# Patient Record
Sex: Male | Born: 1960 | Race: White | Hispanic: No | Marital: Married | Smoking: Never smoker
Health system: Southern US, Community
[De-identification: ages and names within clinical notes are randomized; demographics above are authoritative.]

## PROBLEM LIST (undated history)

## (undated) DIAGNOSIS — F32A Depression, unspecified: Secondary | ICD-10-CM

## (undated) DIAGNOSIS — K219 Gastro-esophageal reflux disease without esophagitis: Secondary | ICD-10-CM

## (undated) DIAGNOSIS — N529 Male erectile dysfunction, unspecified: Secondary | ICD-10-CM

## (undated) DIAGNOSIS — E785 Hyperlipidemia, unspecified: Secondary | ICD-10-CM

## (undated) DIAGNOSIS — F329 Major depressive disorder, single episode, unspecified: Secondary | ICD-10-CM

## (undated) DIAGNOSIS — T8859XA Other complications of anesthesia, initial encounter: Secondary | ICD-10-CM

## (undated) DIAGNOSIS — T4145XA Adverse effect of unspecified anesthetic, initial encounter: Secondary | ICD-10-CM

## (undated) DIAGNOSIS — T7840XA Allergy, unspecified, initial encounter: Secondary | ICD-10-CM

## (undated) DIAGNOSIS — G2581 Restless legs syndrome: Secondary | ICD-10-CM

## (undated) DIAGNOSIS — I2510 Atherosclerotic heart disease of native coronary artery without angina pectoris: Secondary | ICD-10-CM

## (undated) DIAGNOSIS — R110 Nausea: Secondary | ICD-10-CM

## (undated) HISTORY — PX: OTHER SURGICAL HISTORY: SHX169

## (undated) HISTORY — DX: Allergy, unspecified, initial encounter: T78.40XA

## (undated) HISTORY — DX: Male erectile dysfunction, unspecified: N52.9

## (undated) HISTORY — PX: CARDIAC CATHETERIZATION: SHX172

## (undated) HISTORY — DX: Hyperlipidemia, unspecified: E78.5

## (undated) HISTORY — DX: Gastro-esophageal reflux disease without esophagitis: K21.9

---

## 1997-10-27 ENCOUNTER — Emergency Department (HOSPITAL_COMMUNITY): Admission: EM | Admit: 1997-10-27 | Discharge: 1997-10-28 | Payer: Self-pay | Admitting: Emergency Medicine

## 1997-12-16 ENCOUNTER — Encounter: Payer: Self-pay | Admitting: Gastroenterology

## 1997-12-16 ENCOUNTER — Ambulatory Visit (HOSPITAL_COMMUNITY): Admission: RE | Admit: 1997-12-16 | Discharge: 1997-12-16 | Payer: Self-pay | Admitting: Gastroenterology

## 2005-02-04 ENCOUNTER — Encounter: Admission: RE | Admit: 2005-02-04 | Discharge: 2005-02-04 | Payer: Self-pay | Admitting: Family Medicine

## 2006-11-14 ENCOUNTER — Encounter: Admission: RE | Admit: 2006-11-14 | Discharge: 2006-11-14 | Payer: Self-pay | Admitting: Neurology

## 2013-01-23 ENCOUNTER — Other Ambulatory Visit (HOSPITAL_BASED_OUTPATIENT_CLINIC_OR_DEPARTMENT_OTHER): Payer: Self-pay | Admitting: Family Medicine

## 2013-01-23 DIAGNOSIS — R1011 Right upper quadrant pain: Secondary | ICD-10-CM

## 2013-01-25 ENCOUNTER — Ambulatory Visit (HOSPITAL_BASED_OUTPATIENT_CLINIC_OR_DEPARTMENT_OTHER)
Admission: RE | Admit: 2013-01-25 | Discharge: 2013-01-25 | Disposition: A | Discharge status: Home / Self Care | Payer: 59 | Source: Ambulatory Visit | Attending: Family Medicine | Admitting: Family Medicine

## 2013-01-25 DIAGNOSIS — R1011 Right upper quadrant pain: Secondary | ICD-10-CM | POA: Insufficient documentation

## 2013-01-25 DIAGNOSIS — R110 Nausea: Secondary | ICD-10-CM | POA: Insufficient documentation

## 2013-01-30 ENCOUNTER — Other Ambulatory Visit (HOSPITAL_COMMUNITY): Payer: Self-pay | Admitting: Family Medicine

## 2013-01-30 DIAGNOSIS — R1011 Right upper quadrant pain: Secondary | ICD-10-CM

## 2013-02-06 ENCOUNTER — Encounter (INDEPENDENT_AMBULATORY_CARE_PROVIDER_SITE_OTHER): Payer: Self-pay | Admitting: General Surgery

## 2013-02-11 ENCOUNTER — Encounter (HOSPITAL_COMMUNITY)
Admission: RE | Admit: 2013-02-11 | Discharge: 2013-02-11 | Disposition: A | Discharge status: Home / Self Care | Payer: 59 | Source: Ambulatory Visit | Attending: Family Medicine | Admitting: Family Medicine

## 2013-02-11 DIAGNOSIS — R1011 Right upper quadrant pain: Secondary | ICD-10-CM | POA: Insufficient documentation

## 2013-02-11 MED ORDER — TECHNETIUM TC 99M MEBROFENIN IV KIT
5.0000 | PACK | Freq: Once | INTRAVENOUS | Status: AC | PRN
  Administered 2013-02-11: 5 via INTRAVENOUS

## 2013-02-13 ENCOUNTER — Ambulatory Visit (INDEPENDENT_AMBULATORY_CARE_PROVIDER_SITE_OTHER): Payer: Commercial Managed Care - PPO | Admitting: General Surgery

## 2013-02-13 ENCOUNTER — Encounter (INDEPENDENT_AMBULATORY_CARE_PROVIDER_SITE_OTHER): Payer: Self-pay | Admitting: General Surgery

## 2013-02-13 ENCOUNTER — Encounter (INDEPENDENT_AMBULATORY_CARE_PROVIDER_SITE_OTHER): Payer: Self-pay

## 2013-02-13 VITALS — BP 126/84 | HR 64 | Temp 97.4°F | Resp 16 | Ht 70.5 in | Wt 207.4 lb

## 2013-02-13 DIAGNOSIS — R1011 Right upper quadrant pain: Secondary | ICD-10-CM

## 2013-02-13 MED ORDER — TAMSULOSIN HCL 0.4 MG PO CAPS: 0.4000 mg | ORAL_CAPSULE | Freq: Every day | ORAL | Status: DC

## 2013-02-13 NOTE — Addendum Note (Signed)
Addended by: Redmond Pulling, Delsin Copen M on: 02/13/2013 01:01 PM   Modules accepted: Orders

## 2013-02-13 NOTE — Progress Notes (Addendum)
Patient ID: David Conner, male   DOB: Dec 30, 1960, 53 y.o.   MRN: [id number]  Chief Complaint  Patient presents with  . Abdominal Pain    new pt- eval RUQ pain possible cholelith    HPI David Conner is a 53 y.o. male.   Abdominal Pain Associated symptoms: no chest pain, no chills, no dysuria, no fever, no hematuria and no shortness of breath    53 year old Caucasian male referred by Dr. Marjean Donna for evaluation of right upper quadrant abdominal pain. The patient states he has been having issues for about 2-3 months. He states that the discomfort mainly occurs after eating foods. It is especially more bothersome after eating a heavy meal. He denies any difficulty or pain swallowing solids or liquids. The discomfort worsened after the holidays. It is associated with a little bit of nausea. He also feels bloated across the top part of his abdomen. The discomfort will last for several hours. He denies any NSAID use. He denies any acholic stools. He denies any jaundice. He lost about 45 pounds last year during the spring when he had a lot of stress in his life. He does have a remote history of esophageal reflux as well as dilatation of his esophagus in the past. He states that he probably underwent a dilation. As a result he takes Nexium on a daily basis. He states that he saw his gastroenterologist recently and discussed the symptoms who thought it was suggestive of gallbladder problems. He states that the discomfort will radiate to his right side and sometimes his back. It also bothers him if he hunches forward. He denies any trauma to his abdomen. He has had an abdominal ultrasound which showed no evidence of gallstones. He had a nuclear medicine scan of his gallbladder which was normal. He states when they injected the medicine it reproduced his symptoms  Past Medical History  Diagnosis Date  . GERD (gastroesophageal reflux disease)   . Allergy   . Hemorrhoids   . ED (erectile  dysfunction)   . Hyperlipidemia     History reviewed. No pertinent past surgical history.  History reviewed. No pertinent family history.  Social History History  Substance Use Topics  . Smoking status: Never Smoker   . Smokeless tobacco: Not on file  . Alcohol Use: No    Allergies  Allergen Reactions  . Lansoprazole   . Ofloxacin   . Penicillin G     Current Outpatient Prescriptions  Medication Sig Dispense Refill  . esomeprazole (NEXIUM) 40 MG capsule Take 40 mg by mouth daily at 12 noon.      . sertraline (ZOLOFT) 50 MG tablet 1 tablet      . testosterone (ANDROGEL) 50 MG/5GM GEL Place 5 g onto the skin daily.      . tamsulosin (FLOMAX) 0.4 MG CAPS capsule Take 1 capsule (0.4 mg total) by mouth daily. Start taking 4 days before surgery  7 capsule  0   No current facility-administered medications for this visit.    Review of Systems Review of Systems  Constitutional: Negative for fever, chills, appetite change and unexpected weight change.  HENT: Negative for congestion and trouble swallowing.   Eyes: Negative for visual disturbance.  Respiratory: Negative for chest tightness and shortness of breath.   Cardiovascular: Negative for chest pain and leg swelling.       No PND, no orthopnea, no DOE  Gastrointestinal: Positive for abdominal pain.       See HPI  Genitourinary: Positive for difficulty urinating. Negative for dysuria and hematuria.       +nocturia  Musculoskeletal: Negative.   Skin: Negative for rash.  Neurological: Negative for seizures and speech difficulty.  Hematological: Does not bruise/bleed easily.  Psychiatric/Behavioral: Negative for behavioral problems and confusion.    Blood pressure 126/84, pulse 64, temperature 97.4 F (36.3 C), temperature source Temporal, resp. rate 16, height 5' 10.5" (1.791 m), weight 207 lb 6.4 oz (94.076 kg).  Physical Exam Physical Exam  Constitutional: He is oriented to person, place, and time. He appears  well-developed and well-nourished. No distress.  HENT:  Head: Normocephalic and atraumatic.  Right Ear: External ear normal.  Left Ear: External ear normal.  Eyes: Conjunctivae are normal. No scleral icterus.  Neck: Normal range of motion. Neck supple. No tracheal deviation present. No thyromegaly present.  Cardiovascular: Normal rate, normal heart sounds and intact distal pulses.   Pulmonary/Chest: Effort normal and breath sounds normal. No respiratory distress. He has no wheezes.  Abdominal: Soft. He exhibits no distension. There is no tenderness. There is no rebound and no guarding.  Musculoskeletal: Normal range of motion. He exhibits no edema and no tenderness.  Lymphadenopathy:    He has no cervical adenopathy.  Neurological: He is alert and oriented to person, place, and time. He exhibits normal muscle tone.  Skin: Skin is warm and dry. No rash noted. He is not diaphoretic. No erythema. No pallor.  Psychiatric: He has a normal mood and affect. His behavior is normal. Judgment and thought content normal.    Data Reviewed Dr Meyers notes abd us  HIDA scan  Assessment    RUQ pain  Nausea    Plan    His symptoms are consistent with gallbladder disease however his radiological imaging has been normal. It is a little bit reassuring that he had a reproduction of symptoms with injection of CCK during the nuclear medicine scan.  We discussed the outcomes of gallbladder surgery in this type of situation when testing is negative but symptoms suggest gallbladder disease. I explained that we see mixed results some patients get complete resolution, others some improvement and others no change. His symptoms are not entirely consistent with peptic ulcer disease but it does appear he has a significant history with reflux and esophageal stricture. I explained that he may benefit from an upper endoscopy to rule out the stomach and the duodenum as a source but I explained the upper endoscopy could  be completely negative. I explained that if we had a normal upper endoscopy I could say with a higher degree of confidence that cholecystectomy would have a better chance of ameliorating his symptoms. He states that his father had normal gallbladder tests and had much improvement after cholecystectomy  After much discussion of the pros and cons of proceeding with cholecystectomy without a preoperative upper endoscopy, the patient has elected to proceed with cholecystectomy. He is going to try to have an upper endoscopy prior to his cholecystectomy. I explained that I would attempt to contact his gastroenterologist to discuss his case.  We discussed gallbladder disease. The patient was given educational material. We discussed non-operative and operative management. We discussed the signs & symptoms of acute cholecystitis  I discussed laparoscopic cholecystectomy with IOC in detail.  The patient was given educational material as well as diagrams detailing the procedure.  We discussed the risks and benefits of a laparoscopic cholecystectomy including, but not limited to bleeding, infection, Failure to ameliorate his symptoms, injury to   surrounding structures such as the intestine or liver, bile leak, retained gallstones, need to convert to an open procedure, prolonged diarrhea, blood clots such as  DVT, common bile duct injury, anesthesia risks, and possible need for additional procedures.  We discussed the typical post-operative recovery course. I explained that the likelihood of improvement of their symptoms is fair-good.   The patient will meet with the scheduler today to schedule surgery. I will also place him on perioperative Flomax to decrease his risk of postoperative urinary retention  Leighton Ruff. Redmond Pulling, MD, FACS General, Bariatric, & Minimally Invasive Surgery [facility], [place]        [facility] M 02/13/2013, 12:45 PM

## 2013-02-13 NOTE — Patient Instructions (Signed)
Get flomax prescription filled and start taking 4 days before surgery and on the morning of surgery

## 2013-02-14 ENCOUNTER — Encounter (HOSPITAL_COMMUNITY)
Admission: RE | Admit: 2013-02-14 | Discharge: 2013-02-14 | Disposition: A | Discharge status: Home / Self Care | Payer: 59 | Source: Ambulatory Visit | Attending: General Surgery | Admitting: General Surgery

## 2013-02-14 ENCOUNTER — Encounter (HOSPITAL_COMMUNITY): Payer: Self-pay

## 2013-02-14 ENCOUNTER — Encounter (HOSPITAL_COMMUNITY): Payer: Self-pay | Admitting: Pharmacy Technician

## 2013-02-14 DIAGNOSIS — Z01812 Encounter for preprocedural laboratory examination: Secondary | ICD-10-CM | POA: Insufficient documentation

## 2013-02-14 DIAGNOSIS — Z01818 Encounter for other preprocedural examination: Secondary | ICD-10-CM | POA: Insufficient documentation

## 2013-02-14 HISTORY — DX: Nausea: R11.0

## 2013-02-14 HISTORY — DX: Major depressive disorder, single episode, unspecified: F32.9

## 2013-02-14 HISTORY — DX: Restless legs syndrome: G25.81

## 2013-02-14 HISTORY — DX: Adverse effect of unspecified anesthetic, initial encounter: T41.45XA

## 2013-02-14 HISTORY — DX: Depression, unspecified: F32.A

## 2013-02-14 HISTORY — DX: Other complications of anesthesia, initial encounter: T88.59XA

## 2013-02-14 LAB — COMPREHENSIVE METABOLIC PANEL
ALT: 34 U/L (ref 0–53)
AST: 27 U/L (ref 0–37)
Albumin: 4.1 g/dL (ref 3.5–5.2)
Alkaline Phosphatase: 62 U/L (ref 39–117)
BUN: 13 mg/dL (ref 6–23)
CO2: 25 mEq/L (ref 19–32)
Calcium: 9.3 mg/dL (ref 8.4–10.5)
Chloride: 100 mEq/L (ref 96–112)
Creatinine, Ser: 1.03 mg/dL (ref 0.50–1.35)
GFR calc Af Amer: >90 mL/min (ref >90)
GFR calc non Af Amer: 82 mL/min — ABNORMAL LOW (ref >90)
Glucose, Bld: 95 mg/dL (ref 70–99)
Potassium: 4.4 mEq/L (ref 3.7–5.3)
Sodium: 139 mEq/L (ref 137–147)
Total Bilirubin: 0.4 mg/dL (ref 0.3–1.2)
Total Protein: 7.4 g/dL (ref 6.0–8.3)

## 2013-02-14 LAB — CBC WITH DIFFERENTIAL/PLATELET
Basophils Absolute: 0 10*3/uL (ref 0.0–0.1)
Basophils Relative: 0 % (ref 0–1)
Eosinophils Absolute: 0.1 10*3/uL (ref 0.0–0.7)
Eosinophils Relative: 1 % (ref 0–5)
HCT: 46.9 % (ref 39.0–52.0)
Hemoglobin: 16.3 g/dL (ref 13.0–17.0)
Lymphocytes Relative: 20 % (ref 12–46)
Lymphs Abs: 1.6 10*3/uL (ref 0.7–4.0)
MCH: 31.3 pg (ref 26.0–34.0)
MCHC: 34.8 g/dL (ref 30.0–36.0)
MCV: 90 fL (ref 78.0–100.0)
Monocytes Absolute: 0.7 10*3/uL (ref 0.1–1.0)
Monocytes Relative: 9 % (ref 3–12)
Neutro Abs: 5.5 10*3/uL (ref 1.7–7.7)
Neutrophils Relative %: 70 % (ref 43–77)
Platelets: 250 10*3/uL (ref 150–400)
RBC: 5.21 MIL/uL (ref 4.22–5.81)
RDW: 12.5 % (ref 11.5–15.5)
WBC: 7.8 10*3/uL (ref 4.0–10.5)

## 2013-02-14 NOTE — Patient Instructions (Addendum)
[place]  02/14/2013   Your procedure is scheduled on: Monday February 2nd  Report to [facility] at 925 AM.  Call this number if you have problems the morning of surgery [phone]   Remember: NO VISITORS UNDER AGE 53 PER [place].   Do not eat food or drink liquids :After Midnight.     Take these medicines the morning of surgery with A SIP OF WATER: nexium                                SEE [place]             You may not have any metal on your body including hair pins and piercings  Do not wear jewelry, make-up.  Do not wear lotions, powders, or perfumes. You may not  wear deodorant.   Men may shave face and neck.  Do not bring valuables to the hospital. [place].  Contacts, dentures or bridgework may not be worn into surgery.  Leave suitcase in the car. After surgery it may be brought to your room.  For patients admitted to the hospital, checkout time is 11:00 AM the day of discharge.    Please read over the following fact sheets that you were given: [facility] Preparing for surgery sheet  Call Zelphia Cairo RN pre op nurse if needed [phone]    [place].  PATIENT SIGNATURE___________________________________________  NURSE SIGNATURE_____________________________________________

## 2013-02-15 ENCOUNTER — Telehealth (INDEPENDENT_AMBULATORY_CARE_PROVIDER_SITE_OTHER): Payer: Self-pay | Admitting: General Surgery

## 2013-02-15 NOTE — Telephone Encounter (Signed)
Called patient to let him know I spoke with his gastroenterologist. Gastroenterologist agrees that patient would probably benefit from cholecystectomy based on his symptoms. The patient still has symptoms after surgery and refer back to him.

## 2013-02-18 ENCOUNTER — Ambulatory Visit (HOSPITAL_COMMUNITY): Payer: 59

## 2013-02-18 ENCOUNTER — Encounter (HOSPITAL_COMMUNITY): Payer: 59 | Admitting: Anesthesiology

## 2013-02-18 ENCOUNTER — Encounter (HOSPITAL_COMMUNITY): Payer: Self-pay | Admitting: *Deleted

## 2013-02-18 ENCOUNTER — Observation Stay (HOSPITAL_COMMUNITY)
Admission: RE | Admit: 2013-02-18 | Discharge: 2013-02-19 | Disposition: A | Discharge status: Home / Self Care | Payer: 59 | Source: Ambulatory Visit | Attending: General Surgery | Admitting: General Surgery

## 2013-02-18 ENCOUNTER — Ambulatory Visit (HOSPITAL_COMMUNITY): Payer: 59 | Admitting: Anesthesiology

## 2013-02-18 ENCOUNTER — Encounter (HOSPITAL_COMMUNITY)
Admission: RE | Disposition: A | Discharge status: Home / Self Care | Payer: Self-pay | Source: Ambulatory Visit | Attending: General Surgery

## 2013-02-18 DIAGNOSIS — Z9049 Acquired absence of other specified parts of digestive tract: Secondary | ICD-10-CM

## 2013-02-18 DIAGNOSIS — K219 Gastro-esophageal reflux disease without esophagitis: Secondary | ICD-10-CM | POA: Insufficient documentation

## 2013-02-18 DIAGNOSIS — R1011 Right upper quadrant pain: Secondary | ICD-10-CM | POA: Insufficient documentation

## 2013-02-18 DIAGNOSIS — F329 Major depressive disorder, single episode, unspecified: Secondary | ICD-10-CM | POA: Insufficient documentation

## 2013-02-18 DIAGNOSIS — Q433 Congenital malformations of intestinal fixation: Secondary | ICD-10-CM | POA: Insufficient documentation

## 2013-02-18 DIAGNOSIS — K811 Chronic cholecystitis: Secondary | ICD-10-CM

## 2013-02-18 DIAGNOSIS — E785 Hyperlipidemia, unspecified: Secondary | ICD-10-CM | POA: Insufficient documentation

## 2013-02-18 DIAGNOSIS — F3289 Other specified depressive episodes: Secondary | ICD-10-CM | POA: Insufficient documentation

## 2013-02-18 HISTORY — PX: CHOLECYSTECTOMY: SHX55

## 2013-02-18 HISTORY — PX: LAPAROSCOPIC APPENDECTOMY: SHX408

## 2013-02-18 SURGERY — LAPAROSCOPIC CHOLECYSTECTOMY WITH INTRAOPERATIVE CHOLANGIOGRAM
Anesthesia: General | Site: Abdomen

## 2013-02-18 MED ORDER — KCL IN DEXTROSE-NACL 20-5-0.45 MEQ/L-%-% IV SOLN
INTRAVENOUS | Status: AC
  Filled 2013-02-18: qty 1000

## 2013-02-18 MED ORDER — PHENYLEPHRINE HCL 10 MG/ML IJ SOLN
INTRAMUSCULAR | Status: DC | PRN
  Administered 2013-02-18: 80 ug via INTRAVENOUS
  Administered 2013-02-18 (×4): 40 ug via INTRAVENOUS
  Administered 2013-02-18: 80 ug via INTRAVENOUS
  Administered 2013-02-18 (×2): 40 ug via INTRAVENOUS

## 2013-02-18 MED ORDER — FENTANYL CITRATE 0.05 MG/ML IJ SOLN
INTRAMUSCULAR | Status: DC | PRN
Start: 2013-02-18 — End: 2013-02-18
  Administered 2013-02-18: 100 ug via INTRAVENOUS
  Administered 2013-02-18 (×3): 50 ug via INTRAVENOUS

## 2013-02-18 MED ORDER — GLYCOPYRROLATE 0.2 MG/ML IJ SOLN
INTRAMUSCULAR | Status: AC
  Filled 2013-02-18: qty 3

## 2013-02-18 MED ORDER — KCL IN DEXTROSE-NACL 20-5-0.45 MEQ/L-%-% IV SOLN
INTRAVENOUS | Status: DC
  Administered 2013-02-18 – 2013-02-19 (×2): via INTRAVENOUS
  Filled 2013-02-18 (×4): qty 1000

## 2013-02-18 MED ORDER — DEXAMETHASONE SODIUM PHOSPHATE 10 MG/ML IJ SOLN
INTRAMUSCULAR | Status: AC
  Filled 2013-02-18: qty 1

## 2013-02-18 MED ORDER — ENOXAPARIN SODIUM 40 MG/0.4ML ~~LOC~~ SOLN
40.0000 mg | SUBCUTANEOUS | Status: DC
  Administered 2013-02-19: 40 mg via SUBCUTANEOUS
  Filled 2013-02-18 (×3): qty 0.4

## 2013-02-18 MED ORDER — ONDANSETRON HCL 4 MG/2ML IJ SOLN
INTRAMUSCULAR | Status: DC | PRN
  Administered 2013-02-18: 4 mg via INTRAVENOUS

## 2013-02-18 MED ORDER — PROPOFOL 10 MG/ML IV BOLUS
INTRAVENOUS | Status: DC | PRN
  Administered 2013-02-18: 200 mg via INTRAVENOUS

## 2013-02-18 MED ORDER — MIDAZOLAM HCL 2 MG/2ML IJ SOLN
INTRAMUSCULAR | Status: AC
  Filled 2013-02-18: qty 2

## 2013-02-18 MED ORDER — SERTRALINE HCL 50 MG PO TABS
50.0000 mg | ORAL_TABLET | Freq: Every day | ORAL | Status: DC
  Administered 2013-02-18: 50 mg via ORAL
  Filled 2013-02-18 (×2): qty 1

## 2013-02-18 MED ORDER — HYDROMORPHONE HCL PF 1 MG/ML IJ SOLN
INTRAMUSCULAR | Status: AC
  Filled 2013-02-18: qty 1

## 2013-02-18 MED ORDER — ROCURONIUM BROMIDE 100 MG/10ML IV SOLN
INTRAVENOUS | Status: DC | PRN
  Administered 2013-02-18 (×2): 10 mg via INTRAVENOUS
  Administered 2013-02-18: 25 mg via INTRAVENOUS
  Administered 2013-02-18: 5 mg via INTRAVENOUS
  Administered 2013-02-18: 10 mg via INTRAVENOUS

## 2013-02-18 MED ORDER — LACTATED RINGERS IV SOLN
INTRAVENOUS | Status: DC | PRN
  Administered 2013-02-18: 1000 mL

## 2013-02-18 MED ORDER — NEOSTIGMINE METHYLSULFATE 1 MG/ML IJ SOLN
INTRAMUSCULAR | Status: DC | PRN
  Administered 2013-02-18: 4 mg via INTRAVENOUS

## 2013-02-18 MED ORDER — BUPIVACAINE-EPINEPHRINE PF 0.25-1:200000 % IJ SOLN
INTRAMUSCULAR | Status: AC
  Filled 2013-02-18: qty 30

## 2013-02-18 MED ORDER — MORPHINE SULFATE 2 MG/ML IJ SOLN: 1.0000 mg | INTRAMUSCULAR | Status: DC | PRN

## 2013-02-18 MED ORDER — ONDANSETRON HCL 4 MG/2ML IJ SOLN: 4.0000 mg | Freq: Four times a day (QID) | INTRAMUSCULAR | Status: DC | PRN

## 2013-02-18 MED ORDER — GLYCOPYRROLATE 0.2 MG/ML IJ SOLN
INTRAMUSCULAR | Status: DC | PRN
  Administered 2013-02-18: 0.6 mg via INTRAVENOUS

## 2013-02-18 MED ORDER — ONDANSETRON HCL 4 MG/2ML IJ SOLN
INTRAMUSCULAR | Status: AC
  Filled 2013-02-18: qty 2

## 2013-02-18 MED ORDER — FENTANYL CITRATE 0.05 MG/ML IJ SOLN
INTRAMUSCULAR | Status: AC
Start: 2013-02-18 — End: 2013-02-18
  Filled 2013-02-18: qty 5

## 2013-02-18 MED ORDER — PROPOFOL 10 MG/ML IV BOLUS
INTRAVENOUS | Status: AC
  Filled 2013-02-18: qty 20

## 2013-02-18 MED ORDER — PHENYLEPHRINE 40 MCG/ML (10ML) SYRINGE FOR IV PUSH (FOR BLOOD PRESSURE SUPPORT)
PREFILLED_SYRINGE | INTRAVENOUS | Status: AC
  Filled 2013-02-18: qty 10

## 2013-02-18 MED ORDER — ONDANSETRON HCL 4 MG PO TABS
4.0000 mg | ORAL_TABLET | Freq: Four times a day (QID) | ORAL | Status: DC | PRN
Start: 2013-02-18 — End: 2013-02-19

## 2013-02-18 MED ORDER — OXYCODONE-ACETAMINOPHEN 5-325 MG PO TABS
1.0000 | ORAL_TABLET | ORAL | Status: DC | PRN
  Administered 2013-02-18 – 2013-02-19 (×5): 1 via ORAL
  Filled 2013-02-18 (×4): qty 1
  Filled 2013-02-18: qty 2

## 2013-02-18 MED ORDER — LIDOCAINE HCL (CARDIAC) 20 MG/ML IV SOLN
INTRAVENOUS | Status: AC
  Filled 2013-02-18: qty 5

## 2013-02-18 MED ORDER — LIDOCAINE HCL (CARDIAC) 20 MG/ML IV SOLN
INTRAVENOUS | Status: DC | PRN
  Administered 2013-02-18: 50 mg via INTRAVENOUS

## 2013-02-18 MED ORDER — TAMSULOSIN HCL 0.4 MG PO CAPS
0.4000 mg | ORAL_CAPSULE | Freq: Every day | ORAL | Status: DC
  Administered 2013-02-18: 0.4 mg via ORAL
  Filled 2013-02-18 (×3): qty 1

## 2013-02-18 MED ORDER — HYDROMORPHONE HCL PF 1 MG/ML IJ SOLN
0.2500 mg | INTRAMUSCULAR | Status: DC | PRN
  Administered 2013-02-18: 0.5 mg via INTRAVENOUS
  Administered 2013-02-18 (×2): 0.25 mg via INTRAVENOUS

## 2013-02-18 MED ORDER — PROMETHAZINE HCL 25 MG/ML IJ SOLN: 6.2500 mg | INTRAMUSCULAR | Status: DC | PRN

## 2013-02-18 MED ORDER — BUPIVACAINE-EPINEPHRINE 0.25% -1:200000 IJ SOLN
INTRAMUSCULAR | Status: DC | PRN
  Administered 2013-02-18: 14 mL

## 2013-02-18 MED ORDER — NEOSTIGMINE METHYLSULFATE 1 MG/ML IJ SOLN
INTRAMUSCULAR | Status: AC
  Filled 2013-02-18: qty 10

## 2013-02-18 MED ORDER — 0.9 % SODIUM CHLORIDE (POUR BTL) OPTIME
TOPICAL | Status: DC | PRN
  Administered 2013-02-18: 12 mL

## 2013-02-18 MED ORDER — DEXAMETHASONE SODIUM PHOSPHATE 10 MG/ML IJ SOLN
INTRAMUSCULAR | Status: DC | PRN
  Administered 2013-02-18: 10 mg via INTRAVENOUS

## 2013-02-18 MED ORDER — CIPROFLOXACIN IN D5W 400 MG/200ML IV SOLN
INTRAVENOUS | Status: DC | PRN
  Administered 2013-02-18: 400 mg via INTRAVENOUS

## 2013-02-18 MED ORDER — LACTATED RINGERS IV SOLN: INTRAVENOUS | Status: DC

## 2013-02-18 MED ORDER — SUCCINYLCHOLINE CHLORIDE 20 MG/ML IJ SOLN
INTRAMUSCULAR | Status: DC | PRN
  Administered 2013-02-18: 100 mg via INTRAVENOUS

## 2013-02-18 MED ORDER — DIATRIZOATE MEGLUMINE 30 % UR SOLN
URETHRAL | Status: DC | PRN
  Administered 2013-02-18: 12 mL

## 2013-02-18 MED ORDER — CIPROFLOXACIN IN D5W 400 MG/200ML IV SOLN
INTRAVENOUS | Status: AC
  Filled 2013-02-18: qty 200

## 2013-02-18 MED ORDER — METRONIDAZOLE IN NACL 5-0.79 MG/ML-% IV SOLN
INTRAVENOUS | Status: AC
  Filled 2013-02-18: qty 100

## 2013-02-18 MED ORDER — METRONIDAZOLE IN NACL 5-0.79 MG/ML-% IV SOLN
INTRAVENOUS | Status: DC | PRN
  Administered 2013-02-18: 500 mg via INTRAVENOUS

## 2013-02-18 MED ORDER — LACTATED RINGERS IV SOLN
INTRAVENOUS | Status: DC
  Administered 2013-02-18: 1000 mL via INTRAVENOUS
  Administered 2013-02-18 (×2): via INTRAVENOUS

## 2013-02-18 SURGICAL SUPPLY — 49 items
ADH SKN CLS APL DERMABOND .7 (GAUZE/BANDAGES/DRESSINGS) ×1
APL SKNCLS STERI-STRIP NONHPOA (GAUZE/BANDAGES/DRESSINGS)
APPLIER CLIP 5 13 M/L LIGAMAX5 (MISCELLANEOUS) ×2
APPLIER CLIP ROT 10 11.4 M/L (STAPLE)
APR CLP MED LRG 11.4X10 (STAPLE)
APR CLP MED LRG 5 ANG JAW (MISCELLANEOUS) ×1
BAG SPEC RTRVL LRG 6X4 10 (ENDOMECHANICALS) ×1
BANDAGE ADH SHEER 1  50/CT (GAUZE/BANDAGES/DRESSINGS) ×3 IMPLANT
BENZOIN TINCTURE PRP APPL 2/3 (GAUZE/BANDAGES/DRESSINGS) ×1 IMPLANT
CANISTER SUCTION 2500CC (MISCELLANEOUS) ×2 IMPLANT
CHLORAPREP W/TINT 26ML (MISCELLANEOUS) ×2 IMPLANT
CLIP APPLIE 5 13 M/L LIGAMAX5 (MISCELLANEOUS) IMPLANT
CLIP APPLIE ROT 10 11.4 M/L (STAPLE) IMPLANT
COVER MAYO STAND STRL (DRAPES) ×1 IMPLANT
COVER SURGICAL LIGHT HANDLE (MISCELLANEOUS) ×1 IMPLANT
CUTTER FLEX LINEAR 45M (STAPLE) ×1 IMPLANT
DECANTER SPIKE VIAL GLASS SM (MISCELLANEOUS) ×1 IMPLANT
DERMABOND ADVANCED (GAUZE/BANDAGES/DRESSINGS) ×1
DERMABOND ADVANCED .7 DNX12 (GAUZE/BANDAGES/DRESSINGS) IMPLANT
DRAPE C-ARM 42X120 X-RAY (DRAPES) ×1 IMPLANT
DRAPE LAPAROSCOPIC ABDOMINAL (DRAPES) ×2 IMPLANT
DRAPE UTILITY XL STRL (DRAPES) ×2 IMPLANT
DRSG TEGADERM 2-3/8X2-3/4 SM (GAUZE/BANDAGES/DRESSINGS) ×1 IMPLANT
ELECT REM PT RETURN 9FT ADLT (ELECTROSURGICAL) ×2
ELECTRODE REM PT RTRN 9FT ADLT (ELECTROSURGICAL) ×1 IMPLANT
GLOVE BIO SURGEON STRL SZ7.5 (GLOVE) ×2 IMPLANT
GLOVE BIOGEL M STRL SZ7.5 (GLOVE) ×1 IMPLANT
GLOVE BIOGEL PI IND STRL 7.0 (GLOVE) ×1 IMPLANT
GLOVE BIOGEL PI INDICATOR 7.0 (GLOVE) ×1
GLOVE INDICATOR 8.0 STRL GRN (GLOVE) ×2 IMPLANT
GOWN STRL REUS W/TWL LRG LVL3 (GOWN DISPOSABLE) ×2 IMPLANT
GOWN STRL REUS W/TWL XL LVL3 (GOWN DISPOSABLE) ×8 IMPLANT
KIT BASIN OR (CUSTOM PROCEDURE TRAY) ×2 IMPLANT
NS IRRIG 1000ML POUR BTL (IV SOLUTION) ×2 IMPLANT
POUCH SPECIMEN RETRIEVAL 10MM (ENDOMECHANICALS) ×2 IMPLANT
RELOAD STAPLE 45 3.5 BLU ETS (ENDOMECHANICALS) IMPLANT
RELOAD STAPLE TA45 3.5 REG BLU (ENDOMECHANICALS) ×2 IMPLANT
SCALPEL HARMONIC ACE (MISCELLANEOUS) ×1 IMPLANT
SET CHOLANGIOGRAPH MIX (MISCELLANEOUS) ×1 IMPLANT
SET IRRIG TUBING LAPAROSCOPIC (IRRIGATION / IRRIGATOR) ×2 IMPLANT
SLEEVE XCEL OPT CAN 5 100 (ENDOMECHANICALS) ×1 IMPLANT
SOLUTION ANTI FOG 6CC (MISCELLANEOUS) ×2 IMPLANT
STRIP CLOSURE SKIN 1/2X4 (GAUZE/BANDAGES/DRESSINGS) ×1 IMPLANT
SUT MNCRL AB 4-0 PS2 18 (SUTURE) ×2 IMPLANT
TOWEL OR 17X26 10 PK STRL BLUE (TOWEL DISPOSABLE) ×2 IMPLANT
TRAY LAP CHOLE (CUSTOM PROCEDURE TRAY) ×2 IMPLANT
TROCAR BLADELESS OPT 5 75 (ENDOMECHANICALS) ×7 IMPLANT
TROCAR XCEL BLUNT TIP 100MML (ENDOMECHANICALS) ×2 IMPLANT
TUBING INSUFFLATION 10FT LAP (TUBING) ×2 IMPLANT

## 2013-02-18 NOTE — Brief Op Note (Signed)
02/18/2013  2:15 PM  PATIENT:  David Conner  53 y.o. male  PRE-OPERATIVE DIAGNOSIS:  right upper quadrant pain  POST-OPERATIVE DIAGNOSIS:  RUQ Pain, CONGENITAL MALROTATION  PROCEDURE:  Procedure(s) with comments: LAPAROSCOPIC CHOLECYSTECTOMY WITH INTRAOPERATIVE CHOLANGIOGRAM,   APPENDECTOMY LAPAROSCOPIC (N/A)  LAPAROSCOPIC LADD's Procedure  SURGEON:  Surgeon(s) and Role:    * [name], MD - Primary    * Adin Hector, MD - Assisting  PHYSICIAN ASSISTANT:   ASSISTANTS: see above   ANESTHESIA:   general  EBL:  Total I/O In: 2600 [I.V.:2600] Out: -   BLOOD ADMINISTERED:none  DRAINS: none   LOCAL MEDICATIONS USED:  MARCAINE     SPECIMEN:  Source of Specimen:  gallbladder, appendix  DISPOSITION OF SPECIMEN:  PATHOLOGY  COUNTS:  YES  TOURNIQUET:  * No tourniquets in log *  DICTATION: .Other Dictation: Dictation Number [id number]  PLAN OF CARE: Admit for overnight observation  PATIENT DISPOSITION:  PACU - hemodynamically stable.   Delay start of Pharmacological VTE agent (>24hrs) due to surgical blood loss or risk of bleeding: no

## 2013-02-18 NOTE — Interval H&P Note (Signed)
History and Physical Interval Note:  02/18/2013 10:41 AM  David Conner  has presented today for surgery, with the diagnosis of right upper q pain  The various methods of treatment have been discussed with the patient and family. After consideration of risks, benefits and other options for treatment, the patient has consented to  Procedure(s): LAPAROSCOPIC CHOLECYSTECTOMY WITH INTRAOPERATIVE CHOLANGIOGRAM (N/A) as a surgical intervention .  The patient's history has been reviewed, patient examined, no change in status, stable for surgery.  I have reviewed the patient's chart and labs.  Questions were answered to the patient's satisfaction.    Leighton Ruff. Redmond Pulling, MD, [place], Bariatric, & Minimally Invasive Surgery [facility], [place]   [facility] M

## 2013-02-18 NOTE — Transfer of Care (Signed)
Immediate Anesthesia Transfer of Care Note  Patient: David Conner  Procedure(s) Performed: Procedure(s) with comments: LAPAROSCOPIC CHOLECYSTECTOMY WITH INTRAOPERATIVE CHOLANGIOGRAM, DIAGNOSTIC LAPAROSCOPY, LYSIS OF ADHESIONS  (N/A) APPENDECTOMY LAPAROSCOPIC (N/A) - DR Redmond Pulling SPOKE WITH PT FAMILY DURING SURGERY ABOUT REMOVING PT APPENDIX DUE TO MALROTATION. FAMILY STATED TO PROCEDE WITH REMOVAL OF APPENDIX.   Patient Location: PACU  Anesthesia Type:General  Level of Consciousness: awake, alert  and oriented  Airway & Oxygen Therapy: Patient Spontanous Breathing and Patient connected to face mask oxygen  Post-op Assessment: Report given to PACU RN and Post -op Vital signs reviewed and stable  Post vital signs: Reviewed and stable  Complications: No apparent anesthesia complications

## 2013-02-18 NOTE — Anesthesia Preprocedure Evaluation (Addendum)
Anesthesia Evaluation  Patient identified by MRN, date of birth, ID band Patient awake    Reviewed: Allergy & Precautions, H&P , NPO status , Patient's Chart, lab work & pertinent test results  History of Anesthesia Complications (+) history of anesthetic complications (Anxiety with Versed )  Airway Mallampati: II TM Distance: <3 FB     Dental  (+) Teeth Intact and Dental Advisory Given,    Pulmonary neg pulmonary ROS,  breath sounds clear to auscultation  Pulmonary exam normal       Cardiovascular negative cardio ROS  Rhythm:Regular Rate:Normal     Neuro/Psych Depression negative neurological ROS     GI/Hepatic Neg liver ROS, GERD-  Medicated,  Endo/Other  negative endocrine ROS  Renal/GU negative Renal ROS  negative genitourinary   Musculoskeletal negative musculoskeletal ROS (+)   Abdominal   Peds  Hematology negative hematology ROS (+)   Anesthesia Other Findings   Reproductive/Obstetrics                          Anesthesia Physical Anesthesia Plan  ASA: II  Anesthesia Plan: General   Post-op Pain Management:    Induction: Intravenous  Airway Management Planned: Oral ETT  Additional Equipment:   Intra-op Plan:   Post-operative Plan: Extubation in OR  Informed Consent: I have reviewed the patients History and Physical, chart, labs and discussed the procedure including the risks, benefits and alternatives for the proposed anesthesia with the patient or authorized representative who has indicated his/her understanding and acceptance.   Dental advisory given  Plan Discussed with: CRNA  Anesthesia Plan Comments: (Avoid intraop. Versed.)       Anesthesia Quick Evaluation

## 2013-02-18 NOTE — H&P (View-Only) (Signed)
Patient ID: David Conner, male   DOB: Dec 30, 1960, 53 y.o.   MRN: [id number]  Chief Complaint  Patient presents with  . Abdominal Pain    new pt- eval RUQ pain possible cholelith    HPI David Conner is a 53 y.o. male.   Abdominal Pain Associated symptoms: no chest pain, no chills, no dysuria, no fever, no hematuria and no shortness of breath    53 year old Caucasian male referred by Dr. Marjean Donna for evaluation of right upper quadrant abdominal pain. The patient states he has been having issues for about 2-3 months. He states that the discomfort mainly occurs after eating foods. It is especially more bothersome after eating a heavy meal. He denies any difficulty or pain swallowing solids or liquids. The discomfort worsened after the holidays. It is associated with a little bit of nausea. He also feels bloated across the top part of his abdomen. The discomfort will last for several hours. He denies any NSAID use. He denies any acholic stools. He denies any jaundice. He lost about 45 pounds last year during the spring when he had a lot of stress in his life. He does have a remote history of esophageal reflux as well as dilatation of his esophagus in the past. He states that he probably underwent a dilation. As a result he takes Nexium on a daily basis. He states that he saw his gastroenterologist recently and discussed the symptoms who thought it was suggestive of gallbladder problems. He states that the discomfort will radiate to his right side and sometimes his back. It also bothers him if he hunches forward. He denies any trauma to his abdomen. He has had an abdominal ultrasound which showed no evidence of gallstones. He had a nuclear medicine scan of his gallbladder which was normal. He states when they injected the medicine it reproduced his symptoms  Past Medical History  Diagnosis Date  . GERD (gastroesophageal reflux disease)   . Allergy   . Hemorrhoids   . ED (erectile  dysfunction)   . Hyperlipidemia     History reviewed. No pertinent past surgical history.  History reviewed. No pertinent family history.  Social History History  Substance Use Topics  . Smoking status: Never Smoker   . Smokeless tobacco: Not on file  . Alcohol Use: No    Allergies  Allergen Reactions  . Lansoprazole   . Ofloxacin   . Penicillin G     Current Outpatient Prescriptions  Medication Sig Dispense Refill  . esomeprazole (NEXIUM) 40 MG capsule Take 40 mg by mouth daily at 12 noon.      . sertraline (ZOLOFT) 50 MG tablet 1 tablet      . testosterone (ANDROGEL) 50 MG/5GM GEL Place 5 g onto the skin daily.      . tamsulosin (FLOMAX) 0.4 MG CAPS capsule Take 1 capsule (0.4 mg total) by mouth daily. Start taking 4 days before surgery  7 capsule  0   No current facility-administered medications for this visit.    Review of Systems Review of Systems  Constitutional: Negative for fever, chills, appetite change and unexpected weight change.  HENT: Negative for congestion and trouble swallowing.   Eyes: Negative for visual disturbance.  Respiratory: Negative for chest tightness and shortness of breath.   Cardiovascular: Negative for chest pain and leg swelling.       No PND, no orthopnea, no DOE  Gastrointestinal: Positive for abdominal pain.       See HPI  Genitourinary: Positive for difficulty urinating. Negative for dysuria and hematuria.       +nocturia  Musculoskeletal: Negative.   Skin: Negative for rash.  Neurological: Negative for seizures and speech difficulty.  Hematological: Does not bruise/bleed easily.  Psychiatric/Behavioral: Negative for behavioral problems and confusion.    Blood pressure 126/84, pulse 64, temperature 97.4 F (36.3 C), temperature source Temporal, resp. rate 16, height 5' 10.5" (1.791 m), weight 207 lb 6.4 oz (94.076 kg).  Physical Exam Physical Exam  Constitutional: He is oriented to person, place, and time. He appears  well-developed and well-nourished. No distress.  HENT:  Head: Normocephalic and atraumatic.  Right Ear: External ear normal.  Left Ear: External ear normal.  Eyes: Conjunctivae are normal. No scleral icterus.  Neck: Normal range of motion. Neck supple. No tracheal deviation present. No thyromegaly present.  Cardiovascular: Normal rate, normal heart sounds and intact distal pulses.   Pulmonary/Chest: Effort normal and breath sounds normal. No respiratory distress. He has no wheezes.  Abdominal: Soft. He exhibits no distension. There is no tenderness. There is no rebound and no guarding.  Musculoskeletal: Normal range of motion. He exhibits no edema and no tenderness.  Lymphadenopathy:    He has no cervical adenopathy.  Neurological: He is alert and oriented to person, place, and time. He exhibits normal muscle tone.  Skin: Skin is warm and dry. No rash noted. He is not diaphoretic. No erythema. No pallor.  Psychiatric: He has a normal mood and affect. His behavior is normal. Judgment and thought content normal.    Data Reviewed Dr Lenise Arena notes abd [place]  HIDA scan  Assessment    RUQ pain  Nausea    Plan    His symptoms are consistent with gallbladder disease however his radiological imaging has been normal. It is a little bit reassuring that he had a reproduction of symptoms with injection of CCK during the nuclear medicine scan.  We discussed the outcomes of gallbladder surgery in this type of situation when testing is negative but symptoms suggest gallbladder disease. I explained that we see mixed results some patients get complete resolution, others some improvement and others no change. His symptoms are not entirely consistent with peptic ulcer disease but it does appear he has a significant history with reflux and esophageal stricture. I explained that he may benefit from an upper endoscopy to rule out the stomach and the duodenum as a source but I explained the upper endoscopy could  be completely negative. I explained that if we had a normal upper endoscopy I could say with a higher degree of confidence that cholecystectomy would have a better chance of ameliorating his symptoms. He states that his father had normal gallbladder tests and had much improvement after cholecystectomy  After much discussion of the pros and cons of proceeding with cholecystectomy without a preoperative upper endoscopy, the patient has elected to proceed with cholecystectomy. He is going to try to have an upper endoscopy prior to his cholecystectomy. I explained that I would attempt to contact his gastroenterologist to discuss his case.  We discussed gallbladder disease. The patient was given Agricultural engineer. We discussed non-operative and operative management. We discussed the signs & symptoms of acute cholecystitis  I discussed laparoscopic cholecystectomy with IOC in detail.  The patient was given educational material as well as diagrams detailing the procedure.  We discussed the risks and benefits of a laparoscopic cholecystectomy including, but not limited to bleeding, infection, Failure to ameliorate his symptoms, injury to  surrounding structures such as the intestine or liver, bile leak, retained gallstones, need to convert to an open procedure, prolonged diarrhea, blood clots such as  DVT, common bile duct injury, anesthesia risks, and possible need for additional procedures.  We discussed the typical post-operative recovery course. I explained that the likelihood of improvement of their symptoms is fair-good.   The patient will meet with the scheduler today to schedule surgery. I will also place him on perioperative Flomax to decrease his risk of postoperative urinary retention  Leighton Ruff. Redmond Pulling, MD, FACS General, Bariatric, & Minimally Invasive Surgery [facility] Surgery, [place]        [facility] M 02/13/2013, 12:45 PM

## 2013-02-18 NOTE — Anesthesia Postprocedure Evaluation (Signed)
  Anesthesia Post-op Note  Patient: David Conner  Procedure(s) Performed: Procedure(s) (LRB): LAPAROSCOPIC CHOLECYSTECTOMY WITH INTRAOPERATIVE CHOLANGIOGRAM, DIAGNOSTIC LAPAROSCOPY, LYSIS OF ADHESIONS  (N/A) APPENDECTOMY LAPAROSCOPIC (N/A)  Patient Location: PACU  Anesthesia Type: General  Level of Consciousness: awake and alert   Airway and Oxygen Therapy: Patient Spontanous Breathing  Post-op Pain: mild  Post-op Assessment: Post-op Vital signs reviewed, Patient's Cardiovascular Status Stable, Respiratory Function Stable, Patent Airway and No signs of Nausea or vomiting  Last Vitals:  Filed Vitals:   02/18/13 1500  BP: 125/86  Pulse: 85  Temp: 36.7 C  Resp: 13    Post-op Vital Signs: stable   Complications: No apparent anesthesia complications

## 2013-02-19 ENCOUNTER — Encounter (HOSPITAL_COMMUNITY): Payer: Self-pay | Admitting: General Surgery

## 2013-02-19 DIAGNOSIS — R1011 Right upper quadrant pain: Secondary | ICD-10-CM | POA: Diagnosis present

## 2013-02-19 DIAGNOSIS — Z9049 Acquired absence of other specified parts of digestive tract: Secondary | ICD-10-CM

## 2013-02-19 LAB — BASIC METABOLIC PANEL
BUN: 8 mg/dL (ref 6–23)
CO2: 25 mEq/L (ref 19–32)
Calcium: 8.8 mg/dL (ref 8.4–10.5)
Chloride: 102 mEq/L (ref 96–112)
Creatinine, Ser: 0.91 mg/dL (ref 0.50–1.35)
GFR calc Af Amer: >90 mL/min (ref >90)
GFR calc non Af Amer: >90 mL/min (ref >90)
Glucose, Bld: 142 mg/dL — ABNORMAL HIGH (ref 70–99)
Potassium: 4.6 mEq/L (ref 3.7–5.3)
Sodium: 139 mEq/L (ref 137–147)

## 2013-02-19 LAB — CBC
HCT: 41.2 % (ref 39.0–52.0)
Hemoglobin: 14.2 g/dL (ref 13.0–17.0)
MCH: 31.1 pg (ref 26.0–34.0)
MCHC: 34.5 g/dL (ref 30.0–36.0)
MCV: 90.2 fL (ref 78.0–100.0)
Platelets: 230 10*3/uL (ref 150–400)
RBC: 4.57 MIL/uL (ref 4.22–5.81)
RDW: 12.3 % (ref 11.5–15.5)
WBC: 12.4 10*3/uL — ABNORMAL HIGH (ref 4.0–10.5)

## 2013-02-19 MED ORDER — SIMETHICONE 40 MG/0.6ML PO SUSP
40.0000 mg | Freq: Once | ORAL | Status: DC
  Filled 2013-02-19: qty 0.6

## 2013-02-19 MED ORDER — OXYCODONE-ACETAMINOPHEN 5-325 MG PO TABS: 1.0000 | ORAL_TABLET | ORAL | Status: DC | PRN

## 2013-02-19 NOTE — Discharge Summary (Signed)
Physician Discharge Summary  David Conner NAT:[id number] DOB: 1960/11/23 DOA: 02/18/2013  PCP: Orpah Melter, MD  Admit date: 02/18/2013 Discharge date: 02/19/2013  Recommendations for Outpatient Follow-up:   Follow-up Information   Follow up with [name], MD. Schedule an appointment as soon as possible for a visit in 3 weeks.   Specialty:  General Surgery   Contact information:   [address] [place] [address] [phone]      Discharge Diagnoses:  Principal Problem:   RUQ pain Active Problems:   Malrotation, congenital   S/P laparoscopic cholecystectomy  Surgical Procedure: Lap Cholecystectomy with IOC, Laparoscopic LADDs procedure, Lap appendectomy  Discharge Condition: good Disposition: home  Diet recommendation: fulls and advance as tolerated to regular  Filed Weights   02/18/13 1525  Weight: 207 lb 6.4 oz (94.076 kg)    Hospital Course:  Patient was brought in for scheduled laparoscopic cholecystectomy with cholangiogram for right upper quarter pain. During surgery it was discovered the patient had malrotation of his bowel. His cecum and appendix were in the left abdomen and the majority of the small intestine was in the right abdomen. After discussing the interoperative findings with the patient's wife and family, I recommended an appendectomy and laparoscopic Ladd procedure. We lysed adhesions resulting in the majority of his colon being on the left side of the abdomen and his small intestine on the right side.His appendix was also removed. He was kept overnight for observation to make sure he did not develop an ileus . On postoperative day one the patient was tolerating clears. He Was ambulating without difficulty. His vital signs are stable.He was deemed stable for discharge  BP 110/70  Pulse 70  Temp(Src) 98.1 F (36.7 C) (Oral)  Resp 18  Ht 5' 10.5" (1.791 m)  Wt 207 lb 6.4 oz (94.076 kg)  BMI 29.33 kg/m2  SpO2 96%  Gen: alert,  NAD, non-toxic appearing Pupils: equal, no scleral icterus Pulm: Lungs clear to auscultation, symmetric chest rise CV: regular rate and rhythm Abd: soft, nontender, nondistended.  No cellulitis. incisions c/d/i Ext: no edema,  Skin: no rash, no jaundice    Discharge Instructions  Discharge Orders   Future Orders Complete By Expires   Increase activity slowly  As directed        Medication List    STOP taking these medications       aspirin 325 MG tablet     sertraline 50 MG tablet  Commonly known as:  ZOLOFT      TAKE these medications       esomeprazole 40 MG capsule  Commonly known as:  NEXIUM  Take 40 mg by mouth every morning.     ibuprofen 200 MG tablet  Commonly known as:  ADVIL,MOTRIN  Take 800 mg by mouth every 6 (six) hours as needed for headache, moderate pain or cramping.     oxyCODONE-acetaminophen 5-325 MG per tablet  Commonly known as:  ROXICET  Take 1-2 tablets by mouth every 4 (four) hours as needed for severe pain.     tamsulosin 0.4 MG Caps capsule  Commonly known as:  FLOMAX  Take 0.4 mg by mouth daily after supper.     testosterone 50 MG/5GM Gel  Commonly known as:  ANDROGEL  Place 7.5 g onto the skin daily.           Follow-up Information   Follow up with [name], MD. Schedule an appointment as soon as possible for a visit  in 3 weeks.   Specialty:  General Surgery   Contact information:   [address] [place] [address] [phone]        The results of significant diagnostics from this hospitalization (including imaging, microbiology, ancillary and laboratory) are listed below for reference.    Significant Diagnostic Studies: Dg Cholangiogram Operative  02/18/2013   CLINICAL DATA:  Cholelithiasis  EXAM: INTRAOPERATIVE CHOLANGIOGRAM  TECHNIQUE: Cholangiographic images from the C-arm fluoroscopic device were submitted for interpretation post-operatively. Please see the procedural report for the amount of  contrast and the fluoroscopy time utilized.  COMPARISON:  None.  FINDINGS: There is opacification of the intra and extrahepatic biliary tree. Free flow contrast material into the duodenum is seen. No filling defects to suggest retained calculi are noted.   Electronically Signed   By: Alcide Clever M.D.   On: 02/18/2013 13:00   US Abdomen Complete  01/25/2013   CLINICAL DATA:  Right upper quadrant pain, nausea for 2-3 months  EXAM: ULTRASOUND ABDOMEN COMPLETE  COMPARISON:  None  FINDINGS: Gallbladder:  Normally distended without stones or wall thickening.  No pericholecystic fluid or sonographic Murphy sign.  Common bile duct:  Diameter: Normal caliber 2 mm diameter  Liver:  Normal echogenicity. Question tiny complicated cystic lesion right lobe 9 x 10 x 7 mm, could represent a cluster of tiny cysts or a single complicated cystic lesion. No additional hepatic mass. Hepatopetal portal venous flow.  IVC:  Normal appearance  Pancreas:  Normal appearance  Spleen:  Normal appearance, 7.5 cm length  Right Kidney:  Length: 11.0 cm. Minimal fetal lobulation. No focal mass or hydronephrosis.  Left Kidney:  Length: 12.3 cm.  Normal morphology without mass or hydronephrosis.  Abdominal aorta:  Normal caliber  Other findings:  No free-fluid  IMPRESSION: Question complicated cystic lesion versus cluster of tiny cysts at the right lobe of the liver.  Followup ultrasound recommended in 6 months to confirm stability.  Remainder of exam is normal.   Electronically Signed   By: Ulyses Southward M.D.   On: 01/25/2013 09:51   Nm Hepato W/eject Fract  02/11/2013   CLINICAL DATA:  Right upper abdominal pain. Negative gallbladder ultrasound.  EXAM: NUCLEAR MEDICINE HEPATOBILIARY IMAGING WITH GALLBLADDER EF  TECHNIQUE: Sequential images of the abdomen were obtained out to 60 minutes following intravenous administration of radiopharmaceutical. After oral ingestion of 8 ounces of Ensure, gallbladder ejection fraction was determined.   COMPARISON:  None.  RADIOPHARMACEUTICALS:  5.0 mCi Tc-6m Choletec  FINDINGS: Prompt uptake and excretion of radiopharmaceutical of the liver. Prompt filling of the gallbladder. Tech documented pain symptoms after oral ingestion of ensure. Normal appearance of activity in the small bowel. Calculated gallbladder ejection fraction 53.5%.  IMPRESSION: Patency of cystic and common bile ducts. Normal gallbladder ejection fracture.   Electronically Signed   By: Oley Balm M.D.   On: 02/11/2013 10:25    Microbiology: No results found for this or any previous visit (from the past 240 hour(s)).   Labs: Basic Metabolic Panel:  Recent Labs Lab 02/14/13 1230 02/19/13 0500  NA 139 139  K 4.4 4.6  CL 100 102  CO2 25 25  GLUCOSE 95 142*  BUN 13 8  CREATININE 1.03 0.91  CALCIUM 9.3 8.8   Liver Function Tests:  Recent Labs Lab 02/14/13 1230  AST 27  ALT 34  ALKPHOS 62  BILITOT 0.4  PROT 7.4  ALBUMIN 4.1   CBC:  Recent Labs Lab 02/14/13 1230  02/19/13 0500  WBC 7.8 12.4*  NEUTROABS 5.5  --   HGB 16.3 14.2  HCT 46.9 41.2  MCV 90.0 90.2  PLT 250 230    Active Problems:   Malrotation, congenital RUQ pain  Time coordinating discharge: 10 minutes  Signed:  [name], MD [facility], [place] [phone] 02/19/2013, 11:33 AM

## 2013-02-19 NOTE — Care Management Note (Signed)
    Page 1 of 1   02/19/2013     10:53:06 AM   CARE MANAGEMENT NOTE 02/19/2013  Patient:  David Conner, David Conner   Account Number:  [id number]  Date Initiated:  02/19/2013  Documentation initiated by:  Sunday Spillers  Subjective/Objective Assessment:   53 yo male admitted s/p lap chole, appy, Ladd's procedure. PTA lived at home with spouse.     Action/Plan:   Home when stable   Anticipated DC Date:  02/19/2013   Anticipated DC Plan:  [place]  CM consult      Choice offered to / List presented to:             Status of service:  Completed, signed off Medicare Important Message given?  NA - LOS <3 / Initial given by admissions (If response is "NO", the following Medicare IM given date fields will be blank) Date Medicare IM given:   Date Additional Medicare IM given:    Discharge Disposition:  HOME/SELF CARE  Per UR Regulation:  Reviewed for med. necessity/level of care/duration of stay  If discussed at [place] of Stay Meetings, dates discussed:    Comments:

## 2013-02-19 NOTE — Op Note (Signed)
NAMEXIOMAR, CROMPTON NO.:  [id number]  MEDICAL RECORD NO.:  [id number]  LOCATION:  74                         FACILITY:  [facility]  PHYSICIAN:  Leighton Ruff. Redmond Pulling, MD, FACSDATE OF BIRTH:  1960-02-08  DATE OF PROCEDURE:  02/18/2013 DATE OF DISCHARGE:                              OPERATIVE REPORT   PREOPERATIVE DIAGNOSIS:  Right upper quadrant pain.  POSTOPERATIVE DIAGNOSIS:  Right upper quadrant pain; congenital malrotation.  PROCEDURE: 1. Laparoscopic cholecystectomy with intraoperative cholangiogram. 2. Laparoscopic appendectomy. 3. Laparoscopic Ladd's procedure.  SURGEON:  Leighton Ruff. Redmond Pulling, MD, FACS  ASSISTANCE SURGEON:  Adin Hector, MD  ANESTHESIA:  General.  ESTIMATED BLOOD LOSS:  Minimal.  SPECIMEN:  Gallbladder and appendix.  FINDINGS:  When I gained access to the abdomen laparoscopically, I had noticed that there was a fair amount of bowel on the right side of his abdomen.  I did the cholangiogram which demonstrated emptying of the contrast into his duodenum, however it was not the typical C loop configuration,  it looked more horizontal and to the right.  I proceeded with cholecystectomy and once I got through that, I looked into his pelvis and noticed that his cecum was actually in his left abdomen along with the appendix.  I confirmed that he had malrotation.  After obtaining verbal permission from the patient's wife and parents, I proceeded with a laparoscopic appendectomy and laparoscopic Ladd's.  INDICATIONS FOR PROCEDURE:  The patient is a 53 year old Caucasian male, who has had intermittent right upper quadrant pain for the past several months that mainly occurs after eating food.  It was worse after eating a heavy meal.  He underwent ultrasound as well as nuclear medicine scan which were both negative.  When I did inject the CCK, it did reproduce his symptoms.  Based on this, we discussed cholecystectomy.  We discussed the risks and  benefits of surgery including, but not limited to, bleeding, infection, injury to surrounding structures, need to convert to an open procedure, bile leak, prolonged diarrhea, incisional hernia, wound infection, blood clot formation, anesthesia concerns such as cardiac and pulmonary issues, common bile duct injury, failure to ameliorate his abdominal pain as well as typical postoperative course. He elected to proceed with surgery.  DESCRIPTION OF PROCEDURE:  After obtaining informed consent, the patient was taken to the operating room #6 at [facility].  He was placed supine on the operative table.  General endotracheal anesthesia was established.  A surgical time-out was performed.  He received IV antibiotics prior to skin incision.  Sequential compression devices had been placed.  His abdomen was prepped and draped in usual standard surgical fashion.  A small vertical infraumbilical incision was made after local had been infiltrated.  The fascia was incised with a #11 blade.  The abdominal cavity was entered.  A pursestring suture was placed around the fascial edges, 0 Vicryl.  A 12 mm Hasson trocar was placed and  pneumoperitoneum was smoothly established up to a patient pressure of 15 mmHg.  The laparoscope was advanced.  There was no evidence of injury to surrounding structures.  The patient was placed in reverse Trendelenburg and rotated left.  Three 5 mm trocars were  placed in the right abdomen, one in the subxiphoid position and 2 in the right hypochondrium, all under direct visualization after local had been infiltrated.  The gallbladder was grasped and retracted toward the right shoulder.  The infundibulum was grasped and retracted out laterally.  The thin overlying adhesions were stripped with a Clinical research associate.  The cystic artery and cystic duct were each circumferentially dissected out and around with the aid of a Clinical research associate.  The critical view  was obtained.  There was no other structures entering the gallbladder.  A clip was placed on the cystic duct as it entered the gallbladder.  It was then partially transected with EndoShear.  A Cook Cholangiogram catheter was then threaded through the abdominal wall into the cystic duct and secured with a clip.  The cholangiogram was performed, which demonstrated prompt opacification of the cystic duct, common hepatic duct, left and right ducts, as well as the common bile duct.  The contrast emptied into the duodenum and I noticed that it just did not have the typical C loop configuration and also observing that he had a large amount of bowel on his right side of his abdomen.  I started thinking whether or not he had malrotation.  The cholangiogram was performed.  Pneumoperitoneum was reestablished.  The Cholangiogram catheter was removed.  Three clips were placed on the biliary aspect of the cystic duct, it was then transected with EndoShears.  The cystic artery was transected after 2 clips had been placed on the proximal aspect of the cystic artery, one as it entered the gallbladder.  The gallbladder was then mobilized up out of the gallbladder fossa using hook electrocautery.  The gallbladder was freed.  The laparoscope was placed in the subxiphoid trocar and the EndoCatch bag was advanced to the umbilical trocar and the gallbladder was placed within an EndoCatch bag and removed from the abdominal cavity.  At this point, I tried lifting the omentum up into the upper abdomen to identify the ligament of Treitz and started running the bowel at the ligament of Treitz, however there were some adhesions preventing me from lifting the omentum up into the upper abdomen.  There was a little bit of bleeding from the omentum which was taken care with the [facility] with electrocautery.  At this point, I decided to switch downstream and started the terminal ileum and run back proximally.   The laparoscope was placed in the subxiphoid trocar and I switched to the patient's right side and using the 2 right-sided trocars, I used atraumatic graspers, I first looked in the right lower abdomen to identify the cecum.  Maintaining the laparoscope toward the midline, I identified the cecum in the left mid abdomen with the appendix in the left midabdomen. I traced out some of the cecum and confirmed this was the right colon which was in fact in the left abdomen.  I confirmed that the patient did in fact have malrotation.  At this point, because we found that he had malrotation and we did not have 100% radiological evidence of cholelithiasis and there was a possibility that he may be symptomatic from his malrotation.  I scrubbed out and went and talked to the patient's wife and mother and father in the waiting room.  I discussed intraoperative findings and discussed my recommendation to proceed with appendectomy and running the bowel and lysing of Ladd bands.  I discussed the risks and benefits of that procedure, they gave me verbal permission to proceed.  I returned to the operating room, scrubbed back in.  The mesoappendix was taken down in a serial fashion with the Harmonic scalpel.  The appendix was stapled off, as it entered the cecum with an articulating 45 mm Endo-GIA stapler with a blue load.  The appendix was placed in an EndoCatch bag and removed from the abdominal cavity.  There was a fair amount of omentum adhered on top of the ascending colon, I did see where the sigmoid colon, descending colon on the left, lateral abdomen going down into the pelvis.  There were few adhesions or attachments between the descending colon and ascending colon.  These were taken down with scissors.  Dr. Johney Maine joined me in the operating room at this point.  We continued to run the ascending colon proximally gently sweeping it toward the midline out of the right abdomen.  Adhesions and adhesive  bands were taken down with the Harmonic scalpel as well as with EndoShear without electrocautery.  I was able to sweep the entire ascending colon and hepatic flexure to the midline of the abdomen toward the left side.  There did not appear to be a window for any potential for internal hernia.  I then re-ran the small bowel starting at the terminal ileum back proximally all the way up to the ligament of Treitz.  At this point, there was no evidence of injury to the small bowel.  The entire small bowel was in the right abdomen. There was no shortening of the mesentery.  The mesentery was not tethered.  There appeared to be no intrinsic bowel stricture.  The stomach appeared to be in normal position along with the proximal part of the duodenum.  The gallbladder fossa was reinspected and it was hemostatic.  A visual surveillance of the abdomen revealed that the colon was pretty much on the left side of the abdomen and the small bowels on the right side of the abdomen.  The Hasson trocar was removed, the previously placed pursestring suture was tied down thus obliterating the fascial defect.  There was no air leak and nothing within our closure.  Pneumoperitoneum was released.  It should be noted that we did add a 5-mm trocar in the left lower quadrant under direct visualization in order to facilitate running the bowel and freeing the adhesions.  All skin incisions were closed with a 4-0 Monocryl followed by the Dermabond.  All needle, instrument, sponge counts were correct x2. There were no immediate complications.  The patient tolerated procedure well.     Leighton Ruff. Redmond Pulling, MD, FACS     EMW/MEDQ  D:  02/18/2013  T:  02/19/2013  Job:  [id number]  cc:   Orpah Melter, M.D. Fax: [phone]  Dr. Lyndel Safe

## 2013-02-19 NOTE — Discharge Instructions (Signed)
[facility] °LAPAROSCOPIC SURGERY: POST OP INSTRUCTIONS °Always review your discharge instruction sheet given to you by the facility where your surgery was performed. °IF YOU HAVE DISABILITY OR FAMILY LEAVE FORMS, YOU MUST BRING THEM TO THE OFFICE FOR PROCESSING.   °DO NOT GIVE THEM TO YOUR DOCTOR. ° °1. A prescription for pain medication may be given to you upon discharge.  Take your pain medication as prescribed, if needed.  If narcotic pain medicine is not needed, then you may take acetaminophen (Tylenol) or ibuprofen (Advil) as needed. °2. Take your usually prescribed medications unless otherwise directed. °3. If you need a refill on your pain medication, please contact your pharmacy.  They will contact our office to request authorization. Prescriptions will not be filled after 5pm or on week-ends. °4. You should follow a light diet the first few days after arrival home, such as soup and crackers, etc.  Be sure to include lots of fluids daily. °5. Most patients will experience some swelling and bruising in the area of the incisions.  Ice packs will help.  Swelling and bruising can take several days to resolve.  °6. It is common to experience some constipation if taking pain medication after surgery.  Increasing fluid intake and taking a stool softener (such as Colace) will usually help or prevent this problem from occurring.  A mild laxative (Milk of Magnesia or Miralax) should be taken according to package instructions if there are no bowel movements after 48 hours. °7. If your surgeon used skin glue on the incision, you may shower in 24 hours.  The glue will flake off over the next 2-3 weeks.  Any sutures or staples will be removed at the office during your follow-up visit. °8. ACTIVITIES:  You may resume regular (light) daily activities beginning the next day--such as daily self-care, walking, climbing stairs--gradually increasing activities as tolerated.  You may have sexual  intercourse when it is comfortable.  Refrain from any heavy lifting or straining until approved by your doctor. °a. You may drive when you are no longer taking prescription pain medication, you can comfortably wear a seatbelt, and you can safely maneuver your car and apply brakes. °9. You should see your doctor in the office for a follow-up appointment approximately 2-3 weeks after your surgery.  Make sure that you call for this appointment within a day or two after you arrive home to insure a convenient appointment time. °10. OTHER INSTRUCTIONS:  °WHEN TO CALL YOUR DOCTOR: °1. Fever over 101.0 °2. Inability to urinate °3. Continued bleeding from incision. °4. Increased pain, redness, or drainage from the incision. °5. Increasing abdominal pain ° °The clinic staff is available to answer your questions during regular business hours.  Please don’t hesitate to call and ask to speak to one of the nurses for clinical concerns.  If you have a medical emergency, go to the nearest emergency room or call 911.  A surgeon from [facility] is always on call at the hospital. °[address], [address], [place], [place]  [address] ? [address] [address], [name], [place]   [address] °[phone] ? [phone] ? FAX [phone] °Web site: [email or web address] ° °

## 2013-02-19 NOTE — Progress Notes (Signed)
UR completed 

## 2013-02-25 ENCOUNTER — Telehealth (INDEPENDENT_AMBULATORY_CARE_PROVIDER_SITE_OTHER): Payer: Self-pay | Admitting: General Surgery

## 2013-02-25 NOTE — Telephone Encounter (Signed)
Returning patient call and he wanted to know if he could start back taking his zoloft and I told him that he could and we will see him on this Friday

## 2013-03-01 ENCOUNTER — Ambulatory Visit (INDEPENDENT_AMBULATORY_CARE_PROVIDER_SITE_OTHER): Payer: Commercial Managed Care - PPO | Admitting: General Surgery

## 2013-03-01 ENCOUNTER — Encounter (INDEPENDENT_AMBULATORY_CARE_PROVIDER_SITE_OTHER): Payer: Self-pay | Admitting: General Surgery

## 2013-03-01 VITALS — BP 127/81 | HR 82 | Temp 98.1°F | Resp 14 | Ht 70.5 in | Wt 210.2 lb

## 2013-03-01 DIAGNOSIS — Z09 Encounter for follow-up examination after completed treatment for conditions other than malignant neoplasm: Secondary | ICD-10-CM

## 2013-03-01 NOTE — Patient Instructions (Signed)
Let's give it a few more weeks to see if the pain resolves on its on.  If the pain is persistent, then we will need to coordinate the workup with your gastroenterologist. Additional workup may include upper endoscopy, imaging with CT scan or MRI. It may be possible that your pain may be musculoskeletal in nature.

## 2013-03-01 NOTE — Progress Notes (Signed)
Subjective:     Patient ID: David Conner, male   DOB: January 22, 1960, 53 y.o.   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 His surgery was on February 2 and he was discharged on the third. He states for the most part he is doing okay. He has returned to work. He denies any fevers, chills, nausea or vomiting. He denies any diarrhea or constipation. He still complains of discomfort in his upper abdomen that radiates to his back when he leans forward or if he's hunched over his desk at work. It is a little bit better than before surgery but it is still present. He reports a decent appetite. He reports more gurgling on the right side of his abdomen  Review of Systems     Objective:   Physical Exam BP 127/81  Pulse 82  Temp(Src) 98.1 F (36.7 C) (Temporal)  Resp 14  Ht 5' 10.5" (1.791 m)  Wt 210 lb 3.2 oz (95.346 kg)  BMI 29.72 kg/m2  Gen: alert, NAD, non-toxic appearing Pupils: equal, no scleral icterus Pulm: Lungs clear to auscultation, symmetric chest rise CV: regular rate and rhythm Abd: soft, nontender, nondistended. Well-healed trocar sites. No cellulitis. No incisional hernia Skin: no rash, no jaundice     Assessment:     Status post laparoscopic cholecystectomy with cholangiogram, laparoscopic Ladd procedure with appendectomy     Plan:     We reviewed his surgical pathology which showed chronic cholecystitis with a benign appendix. We rediscussed the intra-operative findings. I explained that I'm not surprised that he feels more gurgling or notices more bowel sounds on the right side of his abdomen since the majority of the small intestine is on the right side of his abdomen.  We rediscussed malrotation. He was given a copy of his pathology report as well as his operative  note.  With respect to his continued discomfort in his upper abdomen when leaning forward which radiates to his back I explained that he is only less than 2 weeks out from surgery I recommended giving it a few more weeks to see if it normalizes. Right now there appears to be no complications from surgery. If his pain and discomfort persist after an additional 4-6 weeks of recovery then he'll probably need additional workup. I explained that his discomfort may be musculoskeletal in nature as well. We did discuss the possibility of pancreatic anatomic variants since he does have malrotation.  Nonetheless we agreed to it a few more weeks of recovery to see how he does. If he has ongoing discomfort then we'll need to coordinate additional workup with his gastroenterologist  The patient was instructed to contact me in about 4 weeks to let me know how he was doing  Leighton Ruff. Redmond Pulling, MD, FACS General, Bariatric, & Minimally Invasive Surgery [facility], [place]

## 2013-04-09 ENCOUNTER — Telehealth (INDEPENDENT_AMBULATORY_CARE_PROVIDER_SITE_OTHER): Payer: Self-pay | Admitting: General Surgery

## 2013-04-09 NOTE — Telephone Encounter (Signed)
Spoke with pt very similar/same issues as preop but not as severe. B/l upper rib cage pain, more on Rt, radiates to back. Leaning forward increases discomfort. Drinking liquids decreases pain. No n/v/f/c/d/c/jaundice. If eats large meal definitely exacerbates pain. Takes nexium daily. Doesn't appear to be complication from GB surgery. rec f/u with his GI doctor or Dr Ardis Hughs in town. rec eating small meals, chewing thoroughly, put fork down bt bites, eat with nondominant hand. Avoid NSAIDS. Pt agrees with plan and will call back if needed.

## 2013-04-09 NOTE — Telephone Encounter (Signed)
Patient wants a phone from Dr Redmond Pulling and wants to talk about his RUQ pain and abdominal pain that he still is having, patient is still having BM, peeing okay , no fevers, he just want Dr Redmond Pulling to call him back and I stated to the patient that it maybe after clinic before he gets a call back and the patient understood

## 2013-04-11 ENCOUNTER — Telehealth (INDEPENDENT_AMBULATORY_CARE_PROVIDER_SITE_OTHER): Payer: Self-pay | Admitting: *Deleted

## 2013-04-11 ENCOUNTER — Other Ambulatory Visit (INDEPENDENT_AMBULATORY_CARE_PROVIDER_SITE_OTHER): Payer: Self-pay | Admitting: General Surgery

## 2013-04-11 DIAGNOSIS — R109 Unspecified abdominal pain: Secondary | ICD-10-CM

## 2013-04-11 NOTE — Telephone Encounter (Signed)
Patient called to ask if Dr. Redmond Pulling would be willing to order a CT Abdomen for him.  He states that they have discussed in the past but patient had declined; however now he has changed his mind.  Explained that I will send him a message to ask then we will let him know.  Patient states understanding and agreeable at this time.

## 2013-04-11 NOTE — Telephone Encounter (Signed)
Called David Conner and schedule him a CT A/P W Wo Contrast on 04-17-2013 @ 9:15 am for an 9:30 apt and the patient is aware that he will need go to the 301 to get his contrast. And be in NPO 4 hours before the test. And he know that Dr Redmond Pulling will call him with the results

## 2013-04-15 ENCOUNTER — Telehealth (INDEPENDENT_AMBULATORY_CARE_PROVIDER_SITE_OTHER): Payer: Self-pay | Admitting: *Deleted

## 2013-04-15 ENCOUNTER — Other Ambulatory Visit (INDEPENDENT_AMBULATORY_CARE_PROVIDER_SITE_OTHER): Payer: Self-pay | Admitting: General Surgery

## 2013-04-15 DIAGNOSIS — R109 Unspecified abdominal pain: Secondary | ICD-10-CM

## 2013-04-15 NOTE — Telephone Encounter (Signed)
Rebeccas with [facility] called.  She said Dr. Redmond Pulling put in an order for CT W  W/O contrast.  Wells Guiles states that they only need to scan the pt with contrast. She states she needs a new order for CT Abdomen Pelvis W contrast. Please adivse..Rebecca's number at GI is [phone].Marland KitchenMarland KitchenAnderson Malta

## 2013-04-15 NOTE — Telephone Encounter (Signed)
pls call rebecca at [facility] - pt needs CT abd/pelvis with oral and IV contrast

## 2013-04-17 ENCOUNTER — Ambulatory Visit
Admission: RE | Admit: 2013-04-17 | Discharge: 2013-04-17 | Disposition: A | Discharge status: Home / Self Care | Payer: 59 | Source: Ambulatory Visit | Attending: General Surgery | Admitting: General Surgery

## 2013-04-17 DIAGNOSIS — R109 Unspecified abdominal pain: Secondary | ICD-10-CM

## 2013-04-17 MED ORDER — IOHEXOL 300 MG/ML  SOLN
100.0000 mL | Freq: Once | INTRAMUSCULAR | Status: AC | PRN
  Administered 2013-04-17: 100 mL via INTRAVENOUS

## 2013-04-18 ENCOUNTER — Telehealth (INDEPENDENT_AMBULATORY_CARE_PROVIDER_SITE_OTHER): Payer: Self-pay | Admitting: General Surgery

## 2013-04-18 NOTE — Telephone Encounter (Signed)
Discuss results of CT scan in detail. His CT scan was essentially normal except for the evidence of partial rotation which we knew about. There is no evidence of bowel obstruction. His pancreas appear normal. There appear to be no complications from his cholecystectomy. At this point I encouraged him to eat small frequent meals as opposed to large bulky meals. He has an appointment with his gastroenterologist in the near future. Followup as needed

## 2015-02-13 MED FILL — ESOMEPRAZOLE MAG DR 40 MG C: 40 | 30 days supply | Qty: 30 | Fill #3

## 2015-02-13 MED FILL — ZOLPIDEM TARTRATE 10 MG TAB: 10 | 30 days supply | Qty: 30 | Fill #1

## 2015-04-10 MED FILL — ANDROGEL 1.62% GEL PUMP: 20.25 MG/AC | 30 days supply | Qty: 75 | Fill #0

## 2015-04-27 MED FILL — SERTRALINE HCL 50 MG TABLET: 50 | 30 days supply | Qty: 30 | Fill #0

## 2015-05-20 MED FILL — ANDROGEL 1.62% GEL PUMP: 20.25 MG/AC | 30 days supply | Qty: 75 | Fill #1

## 2015-06-29 MED FILL — ANDROGEL 1.62% GEL PUMP: 20.25 MG/AC | 30 days supply | Qty: 75 | Fill #2

## 2015-08-11 MED FILL — ANDROGEL 1.62% GEL PUMP: 20.25 MG/AC | 30 days supply | Qty: 75 | Fill #3

## 2015-09-03 MED FILL — ZOLPIDEM TARTRATE 10 MG TAB: 10 | 30 days supply | Qty: 30 | Fill #0

## 2015-10-01 MED FILL — ZOLPIDEM TARTRATE 10 MG TAB: 10 | 30 days supply | Qty: 30 | Fill #1

## 2015-10-09 MED FILL — ANDROGEL 1.62% GEL PUMP: 20.25 MG/AC | 30 days supply | Qty: 75 | Fill #0

## 2015-10-29 MED FILL — ZOLPIDEM TARTRATE 10 MG TAB: 10 | 30 days supply | Qty: 30 | Fill #2

## 2015-11-05 MED FILL — ANDROGEL 1.62% GEL PUMP: 20.25 MG/AC | 30 days supply | Qty: 75 | Fill #1

## 2015-12-02 MED FILL — ZOLPIDEM TARTRATE 10 MG TAB: 10 | 30 days supply | Qty: 30 | Fill #0

## 2015-12-16 MED FILL — ANDROGEL 1.62% GEL PUMP: 20.25 MG/AC | 30 days supply | Qty: 75 | Fill #2

## 2015-12-29 MED FILL — ZOLPIDEM TARTRATE 10 MG TAB: 10 | 30 days supply | Qty: 30 | Fill #1

## 2016-01-19 DIAGNOSIS — R42 Dizziness and giddiness: Secondary | ICD-10-CM | POA: Diagnosis not present

## 2016-01-19 DIAGNOSIS — J069 Acute upper respiratory infection, unspecified: Secondary | ICD-10-CM | POA: Diagnosis not present

## 2016-01-19 MED FILL — predniSONE 5 MG (21) TBPK: 5 | 6 days supply | Qty: 21 | Fill #0

## 2016-01-25 MED FILL — ANDROGEL 1.62% GEL PUMP: 20.25 MG/AC | 30 days supply | Qty: 75 | Fill #3

## 2016-01-26 MED FILL — ZOLPIDEM TARTRATE 10 MG TAB: 10 | 30 days supply | Qty: 30 | Fill #2

## 2016-03-03 MED FILL — ZOLPIDEM TARTRATE 10 MG TAB: 10 | 30 days supply | Qty: 30 | Fill #3

## 2016-03-23 DIAGNOSIS — Z79899 Other long term (current) drug therapy: Secondary | ICD-10-CM | POA: Diagnosis not present

## 2016-03-23 DIAGNOSIS — Z0000 Encounter for general adult medical examination without abnormal findings: Secondary | ICD-10-CM | POA: Diagnosis not present

## 2016-03-23 DIAGNOSIS — E291 Testicular hypofunction: Secondary | ICD-10-CM | POA: Diagnosis not present

## 2016-03-23 DIAGNOSIS — Z125 Encounter for screening for malignant neoplasm of prostate: Secondary | ICD-10-CM | POA: Diagnosis not present

## 2016-03-23 MED FILL — ANDROGEL 1.62% GEL PUMP: 20.25 MG/AC | 30 days supply | Qty: 75 | Fill #4

## 2016-03-28 MED FILL — ESOMEPRAZOLE MAG DR 40 MG C: 40 | 30 days supply | Qty: 30 | Fill #0

## 2016-04-01 MED FILL — ZOLPIDEM TARTRATE 10 MG TAB: 10 | 30 days supply | Qty: 30 | Fill #0

## 2016-04-05 DIAGNOSIS — K219 Gastro-esophageal reflux disease without esophagitis: Secondary | ICD-10-CM | POA: Diagnosis not present

## 2016-04-05 DIAGNOSIS — Z86010 Personal history of colonic polyps: Secondary | ICD-10-CM | POA: Diagnosis not present

## 2016-04-29 MED FILL — ZOLPIDEM TARTRATE 10 MG TAB: 10 | 30 days supply | Qty: 30 | Fill #1

## 2016-05-26 MED FILL — ZOLPIDEM TARTRATE 10 MG TAB: 10 | 30 days supply | Qty: 30 | Fill #2

## 2016-05-27 MED FILL — ANDROGEL 1.62% GEL PUMP: 20.25 MG/AC | 30 days supply | Qty: 75 | Fill #0

## 2016-06-27 MED FILL — ANDROGEL 1.62% GEL PUMP: 20.25 MG/AC | 30 days supply | Qty: 75 | Fill #1

## 2016-07-19 DIAGNOSIS — H527 Unspecified disorder of refraction: Secondary | ICD-10-CM | POA: Diagnosis not present

## 2016-07-21 MED FILL — ZOLPIDEM TARTRATE 10 MG TAB: 10 | 30 days supply | Qty: 30 | Fill #3

## 2016-07-26 DIAGNOSIS — M545 Low back pain: Secondary | ICD-10-CM | POA: Diagnosis not present

## 2016-07-26 DIAGNOSIS — R030 Elevated blood-pressure reading, without diagnosis of hypertension: Secondary | ICD-10-CM | POA: Diagnosis not present

## 2016-07-27 ENCOUNTER — Other Ambulatory Visit: Payer: Self-pay | Admitting: Neurological Surgery

## 2016-07-28 ENCOUNTER — Other Ambulatory Visit: Payer: Self-pay | Admitting: Neurological Surgery

## 2016-07-28 DIAGNOSIS — M545 Low back pain, unspecified: Secondary | ICD-10-CM

## 2016-08-08 ENCOUNTER — Ambulatory Visit
Admission: RE | Admit: 2016-08-08 | Discharge: 2016-08-08 | Disposition: A | Discharge status: Home / Self Care | Payer: 59 | Source: Ambulatory Visit | Attending: Neurological Surgery | Admitting: Neurological Surgery

## 2016-08-08 DIAGNOSIS — M545 Low back pain, unspecified: Secondary | ICD-10-CM

## 2016-08-08 DIAGNOSIS — M5126 Other intervertebral disc displacement, lumbar region: Secondary | ICD-10-CM | POA: Diagnosis not present

## 2016-08-15 DIAGNOSIS — G8929 Other chronic pain: Secondary | ICD-10-CM | POA: Diagnosis not present

## 2016-08-15 DIAGNOSIS — M545 Low back pain: Secondary | ICD-10-CM | POA: Diagnosis not present

## 2016-08-15 DIAGNOSIS — R030 Elevated blood-pressure reading, without diagnosis of hypertension: Secondary | ICD-10-CM | POA: Diagnosis not present

## 2016-08-23 DIAGNOSIS — M6281 Muscle weakness (generalized): Secondary | ICD-10-CM | POA: Diagnosis not present

## 2016-08-23 DIAGNOSIS — M545 Low back pain: Secondary | ICD-10-CM | POA: Diagnosis not present

## 2016-08-24 DIAGNOSIS — D1801 Hemangioma of skin and subcutaneous tissue: Secondary | ICD-10-CM | POA: Diagnosis not present

## 2016-08-24 DIAGNOSIS — L821 Other seborrheic keratosis: Secondary | ICD-10-CM | POA: Diagnosis not present

## 2016-08-24 DIAGNOSIS — L573 Poikiloderma of Civatte: Secondary | ICD-10-CM | POA: Diagnosis not present

## 2016-08-26 MED FILL — ANDROGEL 1.62% GEL PUMP: 20.25 MG/AC | 30 days supply | Qty: 75 | Fill #2

## 2016-08-26 MED FILL — ZOLPIDEM TARTRATE 10 MG TAB: 10 | 30 days supply | Qty: 30 | Fill #4

## 2016-08-31 DIAGNOSIS — M545 Low back pain: Secondary | ICD-10-CM | POA: Diagnosis not present

## 2016-08-31 DIAGNOSIS — M6281 Muscle weakness (generalized): Secondary | ICD-10-CM | POA: Diagnosis not present

## 2016-09-06 DIAGNOSIS — M545 Low back pain: Secondary | ICD-10-CM | POA: Diagnosis not present

## 2016-09-06 DIAGNOSIS — M6281 Muscle weakness (generalized): Secondary | ICD-10-CM | POA: Diagnosis not present

## 2016-09-08 DIAGNOSIS — M545 Low back pain: Secondary | ICD-10-CM | POA: Diagnosis not present

## 2016-09-08 DIAGNOSIS — M6281 Muscle weakness (generalized): Secondary | ICD-10-CM | POA: Diagnosis not present

## 2016-09-09 DIAGNOSIS — Z125 Encounter for screening for malignant neoplasm of prostate: Secondary | ICD-10-CM | POA: Diagnosis not present

## 2016-09-13 DIAGNOSIS — M545 Low back pain: Secondary | ICD-10-CM | POA: Diagnosis not present

## 2016-09-13 DIAGNOSIS — M6281 Muscle weakness (generalized): Secondary | ICD-10-CM | POA: Diagnosis not present

## 2016-09-15 DIAGNOSIS — M545 Low back pain: Secondary | ICD-10-CM | POA: Diagnosis not present

## 2016-09-15 DIAGNOSIS — M6281 Muscle weakness (generalized): Secondary | ICD-10-CM | POA: Diagnosis not present

## 2016-09-20 DIAGNOSIS — M6281 Muscle weakness (generalized): Secondary | ICD-10-CM | POA: Diagnosis not present

## 2016-09-20 DIAGNOSIS — M545 Low back pain: Secondary | ICD-10-CM | POA: Diagnosis not present

## 2016-09-21 DIAGNOSIS — M545 Low back pain: Secondary | ICD-10-CM | POA: Diagnosis not present

## 2016-09-21 DIAGNOSIS — M6281 Muscle weakness (generalized): Secondary | ICD-10-CM | POA: Diagnosis not present

## 2016-09-21 DIAGNOSIS — K219 Gastro-esophageal reflux disease without esophagitis: Secondary | ICD-10-CM | POA: Diagnosis not present

## 2016-09-21 DIAGNOSIS — E291 Testicular hypofunction: Secondary | ICD-10-CM | POA: Diagnosis not present

## 2016-09-21 DIAGNOSIS — G479 Sleep disorder, unspecified: Secondary | ICD-10-CM | POA: Diagnosis not present

## 2016-09-22 MED FILL — ANDROGEL 1.62% GEL PUMP: 20.25 MG/AC | 30 days supply | Qty: 75 | Fill #0

## 2016-09-22 MED FILL — ZOLPIDEM TARTRATE 10 MG TAB: 10 | 30 days supply | Qty: 30 | Fill #0

## 2016-09-26 DIAGNOSIS — M6281 Muscle weakness (generalized): Secondary | ICD-10-CM | POA: Diagnosis not present

## 2016-09-26 DIAGNOSIS — M545 Low back pain: Secondary | ICD-10-CM | POA: Diagnosis not present

## 2016-11-01 MED FILL — ZOLPIDEM TARTRATE 10 MG TAB: 10 | 30 days supply | Qty: 30 | Fill #1

## 2016-11-14 DIAGNOSIS — Z23 Encounter for immunization: Secondary | ICD-10-CM | POA: Diagnosis not present

## 2016-11-15 MED FILL — TESTOSTERONE 20.25 MG/ACT (: 20.25 MG/AC | 30 days supply | Qty: 75 | Fill #1

## 2016-12-02 MED FILL — ZOLPIDEM TARTRATE 10 MG TAB: 10 | 30 days supply | Qty: 30 | Fill #2

## 2016-12-29 MED FILL — ZOLPIDEM TARTRATE 10 MG TAB: 10 | 30 days supply | Qty: 30 | Fill #3

## 2017-01-19 MED FILL — TESTOSTERONE 20.25 MG/ACT (: 20.25 MG/AC | 30 days supply | Qty: 75 | Fill #2

## 2017-01-30 MED FILL — ZOLPIDEM TARTRATE 10 MG TAB: 10 | 30 days supply | Qty: 30 | Fill #4

## 2017-02-17 MED FILL — TESTOSTERONE 20.25 MG/ACT (: 20.25 MG/AC | 30 days supply | Qty: 75 | Fill #3

## 2017-03-07 MED FILL — ZOLPIDEM TARTRATE 10 MG TAB: 10 | 30 days supply | Qty: 30 | Fill #5

## 2017-03-28 DIAGNOSIS — L573 Poikiloderma of Civatte: Secondary | ICD-10-CM | POA: Diagnosis not present

## 2017-03-28 DIAGNOSIS — E291 Testicular hypofunction: Secondary | ICD-10-CM | POA: Diagnosis not present

## 2017-03-28 DIAGNOSIS — Z136 Encounter for screening for cardiovascular disorders: Secondary | ICD-10-CM | POA: Diagnosis not present

## 2017-03-28 DIAGNOSIS — Z125 Encounter for screening for malignant neoplasm of prostate: Secondary | ICD-10-CM | POA: Diagnosis not present

## 2017-03-28 DIAGNOSIS — Z23 Encounter for immunization: Secondary | ICD-10-CM | POA: Diagnosis not present

## 2017-03-28 DIAGNOSIS — Z0000 Encounter for general adult medical examination without abnormal findings: Secondary | ICD-10-CM | POA: Diagnosis not present

## 2017-03-28 DIAGNOSIS — G4700 Insomnia, unspecified: Secondary | ICD-10-CM | POA: Diagnosis not present

## 2017-03-28 DIAGNOSIS — D1801 Hemangioma of skin and subcutaneous tissue: Secondary | ICD-10-CM | POA: Diagnosis not present

## 2017-03-28 DIAGNOSIS — K219 Gastro-esophageal reflux disease without esophagitis: Secondary | ICD-10-CM | POA: Diagnosis not present

## 2017-03-28 DIAGNOSIS — L738 Other specified follicular disorders: Secondary | ICD-10-CM | POA: Diagnosis not present

## 2017-03-28 DIAGNOSIS — R7303 Prediabetes: Secondary | ICD-10-CM | POA: Diagnosis not present

## 2017-10-11 MED FILL — ZOLPIDEM TARTRATE 10 MG TAB: 10 | 30 days supply | Qty: 30 | Fill #0

## 2017-10-20 DIAGNOSIS — R1011 Right upper quadrant pain: Secondary | ICD-10-CM | POA: Diagnosis not present

## 2017-10-23 DIAGNOSIS — R1011 Right upper quadrant pain: Secondary | ICD-10-CM | POA: Diagnosis not present

## 2017-10-23 DIAGNOSIS — K760 Fatty (change of) liver, not elsewhere classified: Secondary | ICD-10-CM | POA: Diagnosis not present

## 2017-10-23 DIAGNOSIS — Z9049 Acquired absence of other specified parts of digestive tract: Secondary | ICD-10-CM | POA: Diagnosis not present

## 2017-10-24 MED FILL — GI COCKTAIL 7:1: 1 days supply | Qty: 400 | Fill #0

## 2017-11-03 DIAGNOSIS — K7689 Other specified diseases of liver: Secondary | ICD-10-CM | POA: Diagnosis not present

## 2017-11-03 DIAGNOSIS — N200 Calculus of kidney: Secondary | ICD-10-CM | POA: Diagnosis not present

## 2017-11-03 DIAGNOSIS — R1011 Right upper quadrant pain: Secondary | ICD-10-CM | POA: Diagnosis not present

## 2017-11-08 MED FILL — ZOLPIDEM TARTRATE 10 MG TAB: 10 | 30 days supply | Qty: 30 | Fill #1

## 2017-11-10 DIAGNOSIS — R0789 Other chest pain: Secondary | ICD-10-CM | POA: Diagnosis not present

## 2017-11-11 ENCOUNTER — Inpatient Hospital Stay (HOSPITAL_COMMUNITY)
Admission: EM | Admit: 2017-11-11 | Discharge: 2017-11-14 | DRG: 247 | Disposition: A | Discharge status: Home / Self Care | Payer: 59 | Attending: Cardiovascular Disease | Admitting: Cardiovascular Disease

## 2017-11-11 ENCOUNTER — Emergency Department (HOSPITAL_COMMUNITY): Payer: 59

## 2017-11-11 ENCOUNTER — Other Ambulatory Visit: Payer: Self-pay

## 2017-11-11 ENCOUNTER — Encounter (HOSPITAL_COMMUNITY): Payer: Self-pay | Admitting: Emergency Medicine

## 2017-11-11 DIAGNOSIS — I2582 Chronic total occlusion of coronary artery: Secondary | ICD-10-CM | POA: Diagnosis present

## 2017-11-11 DIAGNOSIS — Z888 Allergy status to other drugs, medicaments and biological substances status: Secondary | ICD-10-CM | POA: Diagnosis not present

## 2017-11-11 DIAGNOSIS — G2581 Restless legs syndrome: Secondary | ICD-10-CM | POA: Diagnosis present

## 2017-11-11 DIAGNOSIS — Z9861 Coronary angioplasty status: Secondary | ICD-10-CM

## 2017-11-11 DIAGNOSIS — I959 Hypotension, unspecified: Secondary | ICD-10-CM

## 2017-11-11 DIAGNOSIS — Z955 Presence of coronary angioplasty implant and graft: Secondary | ICD-10-CM

## 2017-11-11 DIAGNOSIS — E785 Hyperlipidemia, unspecified: Secondary | ICD-10-CM | POA: Diagnosis not present

## 2017-11-11 DIAGNOSIS — K219 Gastro-esophageal reflux disease without esophagitis: Secondary | ICD-10-CM | POA: Diagnosis not present

## 2017-11-11 DIAGNOSIS — Z9049 Acquired absence of other specified parts of digestive tract: Secondary | ICD-10-CM

## 2017-11-11 DIAGNOSIS — I2510 Atherosclerotic heart disease of native coronary artery without angina pectoris: Secondary | ICD-10-CM | POA: Diagnosis not present

## 2017-11-11 DIAGNOSIS — Z7982 Long term (current) use of aspirin: Secondary | ICD-10-CM | POA: Diagnosis not present

## 2017-11-11 DIAGNOSIS — Z79899 Other long term (current) drug therapy: Secondary | ICD-10-CM | POA: Diagnosis not present

## 2017-11-11 DIAGNOSIS — R079 Chest pain, unspecified: Secondary | ICD-10-CM | POA: Diagnosis not present

## 2017-11-11 DIAGNOSIS — Z880 Allergy status to penicillin: Secondary | ICD-10-CM

## 2017-11-11 DIAGNOSIS — I214 Non-ST elevation (NSTEMI) myocardial infarction: Principal | ICD-10-CM | POA: Diagnosis present

## 2017-11-11 HISTORY — DX: Atherosclerotic heart disease of native coronary artery without angina pectoris: I25.10

## 2017-11-11 LAB — CBC
HCT: 50.4 % (ref 39.0–52.0)
Hemoglobin: 16.2 g/dL (ref 13.0–17.0)
MCH: 29.8 pg (ref 26.0–34.0)
MCHC: 32.1 g/dL (ref 30.0–36.0)
MCV: 92.6 fL (ref 80.0–100.0)
Platelets: 317 10*3/uL (ref 150–400)
RBC: 5.44 MIL/uL (ref 4.22–5.81)
RDW: 11.8 % (ref 11.5–15.5)
WBC: 5.6 10*3/uL (ref 4.0–10.5)
nRBC: 0 % (ref 0.0–0.2)

## 2017-11-11 LAB — HEMOGLOBIN A1C
Hgb A1c MFr Bld: 5.3 % (ref 4.8–5.6)
Mean Plasma Glucose: 105.41 mg/dL

## 2017-11-11 LAB — I-STAT TROPONIN, ED: Troponin i, poc: 0.32 ng/mL (ref 0.00–0.08)

## 2017-11-11 LAB — BASIC METABOLIC PANEL
Anion gap: 9 (ref 5–15)
BUN: 8 mg/dL (ref 6–20)
CO2: 23 mmol/L (ref 22–32)
Calcium: 9.6 mg/dL (ref 8.9–10.3)
Chloride: 107 mmol/L (ref 98–111)
Creatinine, Ser: 1.07 mg/dL (ref 0.61–1.24)
GFR calc Af Amer: >60 mL/min (ref >60)
GFR calc non Af Amer: >60 mL/min (ref >60)
Glucose, Bld: 99 mg/dL (ref 70–99)
Potassium: 4 mmol/L (ref 3.5–5.1)
Sodium: 139 mmol/L (ref 135–145)

## 2017-11-11 LAB — TROPONIN I: Troponin I: 0.18 ng/mL (ref <0.03)

## 2017-11-11 LAB — LIPID PANEL
Cholesterol: 191 mg/dL (ref 0–200)
HDL: 41 mg/dL (ref >40)
LDL Cholesterol: 142 mg/dL — ABNORMAL HIGH (ref 0–99)
Total CHOL/HDL Ratio: 4.7 RATIO
Triglycerides: 38 mg/dL (ref <150)
VLDL: 8 mg/dL (ref 0–40)

## 2017-11-11 MED ORDER — HEPARIN BOLUS VIA INFUSION
4000.0000 [IU] | Freq: Once | INTRAVENOUS | Status: AC
  Administered 2017-11-11: 4000 [IU] via INTRAVENOUS
  Filled 2017-11-11: qty 4000

## 2017-11-11 MED ORDER — ASPIRIN 81 MG PO CHEW
81.0000 mg | CHEWABLE_TABLET | Freq: Every day | ORAL | Status: DC
  Administered 2017-11-12 – 2017-11-14 (×3): 81 mg via ORAL
  Filled 2017-11-11 (×3): qty 1

## 2017-11-11 MED ORDER — ZOLPIDEM TARTRATE 5 MG PO TABS: 10.0000 mg | ORAL_TABLET | Freq: Every day | ORAL | Status: DC

## 2017-11-11 MED ORDER — ACETAMINOPHEN 325 MG PO TABS
650.0000 mg | ORAL_TABLET | ORAL | Status: DC | PRN
  Administered 2017-11-12 (×2): 650 mg via ORAL
  Filled 2017-11-11 (×2): qty 2

## 2017-11-11 MED ORDER — TESTOSTERONE 50 MG/5GM (1%) TD GEL
5.0000 g | Freq: Every day | TRANSDERMAL | Status: DC
  Filled 2017-11-11 (×2): qty 5

## 2017-11-11 MED ORDER — ATORVASTATIN CALCIUM 80 MG PO TABS
80.0000 mg | ORAL_TABLET | Freq: Every day | ORAL | Status: DC
  Administered 2017-11-11 – 2017-11-13 (×3): 80 mg via ORAL
  Filled 2017-11-11: qty 4
  Filled 2017-11-11 (×2): qty 1

## 2017-11-11 MED ORDER — NITROGLYCERIN 0.4 MG SL SUBL: 0.4000 mg | SUBLINGUAL_TABLET | SUBLINGUAL | Status: DC | PRN

## 2017-11-11 MED ORDER — PANTOPRAZOLE SODIUM 40 MG PO TBEC
80.0000 mg | DELAYED_RELEASE_TABLET | Freq: Every day | ORAL | Status: DC
  Administered 2017-11-12 – 2017-11-14 (×2): 80 mg via ORAL
  Filled 2017-11-11 (×3): qty 2

## 2017-11-11 MED ORDER — ONDANSETRON HCL 4 MG/2ML IJ SOLN: 4.0000 mg | Freq: Four times a day (QID) | INTRAMUSCULAR | Status: DC | PRN

## 2017-11-11 MED ORDER — ATORVASTATIN CALCIUM 20 MG PO TABS: 80.0000 mg | ORAL_TABLET | Freq: Every day | ORAL | Status: DC

## 2017-11-11 MED ORDER — ASPIRIN 81 MG PO CHEW
243.0000 mg | CHEWABLE_TABLET | Freq: Once | ORAL | Status: AC
  Administered 2017-11-11: 243 mg via ORAL
  Filled 2017-11-11: qty 3

## 2017-11-11 MED ORDER — ZOLPIDEM TARTRATE 5 MG PO TABS
10.0000 mg | ORAL_TABLET | Freq: Every evening | ORAL | Status: DC | PRN
  Administered 2017-11-12 – 2017-11-13 (×3): 10 mg via ORAL
  Filled 2017-11-11 (×3): qty 2

## 2017-11-11 MED ORDER — NITROGLYCERIN IN D5W 200-5 MCG/ML-% IV SOLN
0.0000 ug/min | Freq: Once | INTRAVENOUS | Status: AC
  Administered 2017-11-11: 5 ug/min via INTRAVENOUS
  Filled 2017-11-11: qty 250

## 2017-11-11 MED ORDER — HEPARIN (PORCINE) IN NACL 100-0.45 UNIT/ML-% IJ SOLN
1600.0000 [IU]/h | INTRAMUSCULAR | Status: DC
  Administered 2017-11-11: 1250 [IU]/h via INTRAVENOUS
  Administered 2017-11-12: 1400 [IU]/h via INTRAVENOUS
  Filled 2017-11-11 (×3): qty 250

## 2017-11-11 MED ORDER — ASPIRIN EC 81 MG PO TBEC: 81.0000 mg | DELAYED_RELEASE_TABLET | Freq: Once | ORAL | Status: DC

## 2017-11-11 MED ORDER — METOPROLOL SUCCINATE ER 25 MG PO TB24
25.0000 mg | ORAL_TABLET | Freq: Every day | ORAL | Status: DC
  Administered 2017-11-11: 25 mg via ORAL
  Filled 2017-11-11 (×2): qty 1

## 2017-11-11 NOTE — ED Triage Notes (Signed)
Pt c/o chest pain with exertion x " a while". He has seen his PCP for same, reports his EKG is "abnormal", referred to cardiologist. Today, same chest pain with exertion while working outside, pt reports is lasted longer than usual.

## 2017-11-11 NOTE — H&P (Signed)
CARDIOLOGY H&P  HPI: David Conner is a 57 y.o. male w/ no cardiac history who presents with chest pain.   Patient reports waxing and waning central chest tightness over the past several months.  Symptoms typically exacerbated by exertion and improved with rest.  Saw his family doctor recently who referred him to cardiology.  Today however, the patient became concerned with worsening central chest tightness and thus came to the emergency department for evaluation.  In the emergency department, his ECG was normal but his initial troponin was found to be elevated.  He was given aspirin and started on a heparin drip.  Review of Systems:     Cardiac Review of Systems: {Y] = yes [ ]  = no  Chest Pain Valu.Nieves ]  Resting SOB [   ] Exertional SOB  [  ]  Orthopnea [  ]   Pedal Edema [   ]    Palpitations [  ] Syncope  [  ]   Presyncope [   ]  General Review of Systems: [Y] = yes [  ]=no Constitional: recent weight change [  ]; anorexia [  ]; fatigue [  ]; nausea [  ]; night sweats [  ]; fever [  ]; or chills [  ];                                                                     Dental: poor dentition[  ];   Eye : blurred vision [  ]; diplopia [   ]; vision changes [  ];  Amaurosis fugax[  ]; Resp: cough [  ];  wheezing[  ];  hemoptysis[  ]; shortness of breath[  ]; paroxysmal nocturnal dyspnea[  ]; dyspnea on exertion[  ]; or orthopnea[  ];  GI:  gallstones[  ], vomiting[  ];  dysphagia[  ]; melena[  ];  hematochezia [  ]; heartburn[  ];   GU: kidney stones [  ]; hematuria[  ];   dysuria [  ];  nocturia[  ];               Skin: rash [  ], swelling[  ];, hair loss[  ];  peripheral edema[  ];  or itching[  ]; Musculosketetal: myalgias[  ];  joint swelling[  ];  joint erythema[  ];  joint pain[  ];  back pain[  ];  Heme/Lymph: bruising[  ];  bleeding[  ];  anemia[  ];  Neuro: TIA[  ];  headaches[  ];  stroke[  ];  vertigo[  ];  seizures[  ];   paresthesias[  ];  difficulty walking[   ];  Psych:depression[  ]; anxiety[  ];  Endocrine: diabetes[  ];  thyroid dysfunction[  ];  Other:  Past Medical History:  Diagnosis Date  . Allergy   . Complication of anesthesia    upper gi done, had anxiety with versed  . Depression    slight, on sertraline  . ED (erectile dysfunction)   . GERD (gastroesophageal reflux disease)   . Hyperlipidemia   . Nausea occasional  . Restless legs syndrome     Prior to Admission medications   Medication Sig Start Date End Date Taking? Authorizing Provider  aspirin EC 81 MG tablet Take 81 mg by mouth once.   Yes [provider]  esomeprazole (NEXIUM) 40 MG capsule Take 40 mg by mouth daily.    Yes [provider]  nitroGLYCERIN (NITROSTAT) 0.3 MG SL tablet Place 0.3 mg under the tongue every 5 (five) minutes as needed for chest pain.  11/10/17  Yes [provider]  Testosterone (ANDROGEL PUMP) 20.25 MG/ACT (1.62%) GEL Place 40.5 mg onto the skin daily. 2 pumps   Yes [provider]  zolpidem (AMBIEN) 10 MG tablet Take 10 mg by mouth at bedtime. 11/08/17  Yes [provider]      Allergies  Allergen Reactions  . Lansoprazole Other (See Comments)    Dizziness   . Ofloxacin Other (See Comments)    insomnia  . Penicillin G Hives    Childhood allergic reaction Has patient had a PCN reaction causing immediate rash, facial/tongue/throat swelling, SOB or lightheadedness with hypotension: Yes Has patient had a PCN reaction causing severe rash involving mucus membranes or skin necrosis: No Has patient had a PCN reaction that required hospitalization: No Has patient had a PCN reaction occurring within the last 10 years: No If all of the above answers are "NO", then may proceed with Cephalosporin use.  Danford Bad [Midazolam] Anxiety    Social History   Socioeconomic History  . Marital status: Married    Spouse name: Not on file  . Number of children: Not on file  . Years of education: Not on  file  . Highest education level: Not on file  Occupational History  . Not on file  Social Needs  . Financial resource strain: Not on file  . Food insecurity:    Worry: Not on file    Inability: Not on file  . Transportation needs:    Medical: Not on file    Non-medical: Not on file  Tobacco Use  . Smoking status: Never Smoker  . Smokeless tobacco: Never Used  Substance and Sexual Activity  . Alcohol use: No  . Drug use: No  . Sexual activity: Yes    Birth control/protection: Sponge  Lifestyle  . Physical activity:    Days per week: Not on file    Minutes per session: Not on file  . Stress: Not on file  Relationships  . Social connections:    Talks on phone: Not on file    Gets together: Not on file    Attends religious service: Not on file    Active member of club or organization: Not on file    Attends meetings of clubs or organizations: Not on file    Relationship status: Not on file  . Intimate partner violence:    Fear of current or ex partner: Not on file    Emotionally abused: Not on file    Physically abused: Not on file    Forced sexual activity: Not on file  Other Topics Concern  . Not on file  Social History Narrative  . Not on file    No family history on file.  PHYSICAL EXAM: Vitals:   11/11/17 1857 11/11/17 1900  BP: (!) 149/93 138/82  Pulse: (!) 113 (!) 102  Resp: (!) 23 12  Temp:    SpO2: 99% 97%   General:  Well appearing. No respiratory difficulty HEENT: normal Neck: supple. no JVD. Carotids 2+ bilat; no bruits. No lymphadenopathy or thryomegaly appreciated. Cor: PMI nondisplaced. Regular rate & rhythm. No rubs, gallops or murmurs. Lungs:  clear Abdomen: soft, nontender, nondistended. No hepatosplenomegaly. No bruits or masses. Good bowel sounds. Extremities: no cyanosis, clubbing, rash, edema Neuro: alert & oriented x 3, cranial nerves grossly intact. moves all 4 extremities w/o difficulty. Affect pleasant.  ECG: Normal sinus rhythm,  normal rate, no ST or T wave abnormalities concerning for acute ischemia  Results for orders placed or performed during the hospital encounter of 11/11/17 (from the past 24 hour(s))  Basic metabolic panel     Status: None   Collection Time: 11/11/17  4:13 PM  Result Value Ref Range   Sodium 139 135 - 145 mmol/L   Potassium 4.0 3.5 - 5.1 mmol/L   Chloride 107 98 - 111 mmol/L   CO2 23 22 - 32 mmol/L   Glucose, Bld 99 70 - 99 mg/dL   BUN 8 6 - 20 mg/dL   Creatinine, Ser 1.07 0.61 - 1.24 mg/dL   Calcium 9.6 8.9 - 10.3 mg/dL   GFR calc non Af Amer >60 >60 mL/min   GFR calc Af Amer >60 >60 mL/min   Anion gap 9 5 - 15  CBC     Status: None   Collection Time: 11/11/17  4:13 PM  Result Value Ref Range   WBC 5.6 4.0 - 10.5 K/uL   RBC 5.44 4.22 - 5.81 MIL/uL   Hemoglobin 16.2 13.0 - 17.0 g/dL   HCT 50.4 39.0 - 52.0 %   MCV 92.6 80.0 - 100.0 fL   MCH 29.8 26.0 - 34.0 pg   MCHC 32.1 30.0 - 36.0 g/dL   RDW 11.8 11.5 - 15.5 %   Platelets 317 150 - 400 K/uL   nRBC 0.0 0.0 - 0.2 %  I-stat troponin, ED     Status: Abnormal   Collection Time: 11/11/17  4:26 PM  Result Value Ref Range   Troponin i, poc 0.32 (HH) 0.00 - 0.08 ng/mL   Comment NOTIFIED PHYSICIAN    Comment 3           Dg Chest 2 View  Result Date: 11/11/2017 CLINICAL DATA:  Pt c/o chest pain with exertion x " a while". He has seen his PCP for same, reports his EKG is "abnormal", referred to cardiologist. Today, same chest pain with exertion while working outside, pt reports is lasted longer than usual. EXAM: CHEST - 2 VIEW COMPARISON:  07/17/2012 FINDINGS: The heart size is normal. The lungs are free of focal consolidations and pleural effusions. No pulmonary edema. IMPRESSION: No active cardiopulmonary disease. Electronically Signed   By: Nolon Nations M.D.   On: 11/11/2017 17:06    ASSESSMENT: David Conner is a 57 y.o. male w/ no cardiac history who presents with chest pressure, found to have NSTEMI.    PLAN/DISCUSSION: #) NSTEMI - repeat troponin q6h x 2 - check lipids, A1c - ASA 324mg  then 81mg  daily - heparin drip for ACS per pharmacy protocol - atorvastatin 80mg  QHS - start metoprolol succinate 25mg  daily - start ACE in AM - SLN, nitro gtt PRN - defer P2Y12 until after cath - plan for cath on Monday - TTE in AM - cardiac rehab  Marcie Mowers, MD Cardiology Fellow, PGY-6

## 2017-11-11 NOTE — ED Notes (Signed)
Attempted to call report to 6E, RN to return call

## 2017-11-11 NOTE — ED Notes (Signed)
6E charge would like pt assessed by RR before coming to 6E due to Nitro level. Pt reports being pain free at this time.

## 2017-11-11 NOTE — ED Notes (Signed)
Pt placed on inpt bed for comfort while holding for SDU assignment. Pt continues to rate pain 1.5

## 2017-11-11 NOTE — ED Notes (Signed)
I-stat tropin results of 0.32 reported to RN,Sara.

## 2017-11-11 NOTE — ED Notes (Signed)
Cards at bedside

## 2017-11-11 NOTE — ED Provider Notes (Signed)
[place] EMERGENCY DEPARTMENT Provider Note   CSN: [id number] Arrival date & time: 11/11/17  1600     History   Chief Complaint Chief Complaint  Patient presents with  . Chest Pain    HPI David Conner is a 57 y.o. male.  David Conner is a 57 y.o. Male with a history of GERD, hiatal hernia and hyperlipidemia, presents to the emergency department for evaluation of chest pain.  Patient reports he had an episode of chest pain brought on after he was out working in his yard.  Reports he has had some similar exertional chest pain over the past few months but this episode lasted longer than normal.  Patient reports he saw his primary care doctor this past Friday regarding his episodes of chest pain, and reports they did an EKG that showed some abnormalities and the patient had been referred for outpatient cardiology evaluation on Monday but after this episode today he presented for evaluation.  He reports the chest pain seemed to radiate back a bit but no radiation to the arm neck or jaw.  He denies any associated shortness of breath, pain did seem to improve after rest, not pleuritic in nature.  No associated lightheadedness or syncope.  No diaphoresis, nausea, vomiting or abdominal pain.  Patient reports long history of GI issues and issues with GERD and hiatal hernias which is what he had been assuming this pain was due to until he saw his primary care doctor this past Friday.  He denies any history of prior cardiac events.  He has never been a smoker.  No history of hypertension or diabetes.     Past Medical History:  Diagnosis Date  . Allergy   . Complication of anesthesia    upper gi done, had anxiety with versed  . Depression    slight, on sertraline  . ED (erectile dysfunction)   . GERD (gastroesophageal reflux disease)   . Hyperlipidemia   . Nausea occasional  . Restless legs syndrome     Patient Active Problem List   Diagnosis Date Noted  .  NSTEMI (non-ST elevated myocardial infarction) ([place]) 11/11/2017  . S/P laparoscopic cholecystectomy 02/19/2013  . 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 CHOLECYSTECTOMY N/A 02/18/2013   Procedure: LAPAROSCOPIC CHOLECYSTECTOMY WITH INTRAOPERATIVE CHOLANGIOGRAM, DIAGNOSTIC LAPAROSCOPY, LYSIS OF ADHESIONS ;  Surgeon: [name], MD;  Location: [facility];  Service: General;  Laterality: N/A;  . colonscopy  2 years ago  . cortisone injection Left 3 weeks ago   dr thinks is bursitis  . esophagus stretching     7-8 times  . LAPAROSCOPIC APPENDECTOMY N/A 02/18/2013   Procedure: APPENDECTOMY LAPAROSCOPIC;  Surgeon: [name], MD;  Location: [facility];  Service: General;  Laterality: N/A;  DR Redmond Pulling SPOKE WITH PT FAMILY DURING SURGERY ABOUT REMOVING PT APPENDIX DUE TO MALROTATION. FAMILY STATED TO PROCEDE WITH REMOVAL OF APPENDIX.         Home Medications    Prior to Admission medications   Medication Sig Start Date End Date Taking? Authorizing Provider  aspirin EC 81 MG tablet Take 81 mg by mouth once.   Yes [provider]  esomeprazole (NEXIUM) 40 MG capsule Take 40 mg by mouth daily.    Yes [provider]  nitroGLYCERIN (NITROSTAT) 0.3 MG SL tablet Place 0.3 mg under the tongue every 5 (five) minutes as needed for chest pain.  11/10/17  Yes [provider]  Testosterone (ANDROGEL PUMP) 20.25 MG/ACT (1.62%) GEL Place 40.5 mg onto the skin daily. 2 pumps   Yes [provider]  zolpidem (AMBIEN) 10 MG tablet Take 10 mg by mouth at bedtime. 11/08/17  Yes [provider]    Family History No family history on file.  Social History Social History   Tobacco Use  . Smoking status: Never Smoker  . Smokeless tobacco: Never Used  Substance Use Topics  . Alcohol use: No  . Drug use: No     Allergies   Lansoprazole; Ofloxacin; Penicillin g; and Versed [midazolam]   Review of Systems Review  of Systems  Constitutional: Negative for chills and fever.  HENT: Negative.   Eyes: Negative for visual disturbance.  Respiratory: Negative for cough and shortness of breath.   Cardiovascular: Positive for chest pain. Negative for palpitations and leg swelling.  Gastrointestinal: Negative for abdominal pain, nausea and vomiting.  Genitourinary: Negative for dysuria and frequency.  Musculoskeletal: Negative for arthralgias and myalgias.  Skin: Negative for color change and rash.  Neurological: Negative for dizziness, syncope, light-headedness and headaches.     Physical Exam Updated Vital Signs BP (!) 151/101 (BP Location: Right Arm)   Pulse 96   Temp 98.8 F (37.1 C) (Oral)   Resp 18   SpO2 100%   Physical Exam  Constitutional: He is oriented to person, place, and time. He appears well-developed and well-nourished. He does not appear ill. No distress.  HENT:  Head: Normocephalic and atraumatic.  Eyes: Pupils are equal, round, and reactive to light. EOM are normal. Right eye exhibits no discharge. Left eye exhibits no discharge.  Neck: Neck supple. No JVD present. No tracheal deviation present.  Cardiovascular: Normal rate, regular rhythm and intact distal pulses. Exam reveals no gallop and no friction rub.  No murmur heard. Pulses:      Radial pulses are 2+ on the right side, and 2+ on the left side.       Dorsalis pedis pulses are 2+ on the right side, and 2+ on the left side.       Posterior tibial pulses are 2+ on the right side, and 2+ on the left side.  Pulmonary/Chest: Effort normal and breath sounds normal. No respiratory distress.  Respirations equal and unlabored, patient able to speak in full sentences, lungs clear to auscultation bilaterally, chest non-tender to palpation  Abdominal: Soft. Bowel sounds are normal. He exhibits no distension and no mass. There is no tenderness. There is no guarding.  Abdomen soft, nondistended, nontender to palpation in all quadrants  without guarding or peritoneal signs  Musculoskeletal:       Right lower leg: Normal. He exhibits no tenderness and no edema.       Left lower leg: Normal. He exhibits no tenderness and no edema.  Neurological: He is alert and oriented to person, place, and time. Coordination normal.  Speech is clear, able to follow commands CN III-XII intact Normal strength in upper and lower extremities bilaterally including dorsiflexion and plantar flexion, strong and equal grip strength Sensation normal to light and sharp touch Moves extremities without ataxia, coordination intact  Skin: Skin is warm and dry. Capillary refill takes less than 2 seconds. He is not diaphoretic.  Psychiatric: He has a normal mood and affect. His behavior is normal.  Nursing note and vitals reviewed.    ED Treatments / Results  Labs (all labs ordered are listed, but only abnormal results are displayed) Labs Reviewed  I-STAT TROPONIN, ED - Abnormal; Notable for the following components:   Troponin i, poc 0.32 (*)    All other components within normal limits  BASIC METABOLIC PANEL  CBC  HEMOGLOBIN A1C  HIV ANTIBODY (ROUTINE TESTING W REFLEX)  TROPONIN I  TROPONIN I  BASIC METABOLIC PANEL    EKG EKG Interpretation  Date/Time:  Saturday November 11 2017 16:03:18 EDT Ventricular Rate:  94 PR Interval:  146 QRS Duration: 94 QT Interval:  360 QTC Calculation: 450 R Axis:   36 Text Interpretation:  Normal sinus rhythm Normal ECG No previous ECGs available Confirmed by Wandra Arthurs [phone]) on 11/11/2017 5:28:17 PM   Radiology Dg Chest 2 View  Result Date: 11/11/2017 CLINICAL DATA:  Pt c/o chest pain with exertion x " a while". He has seen his PCP for same, reports his EKG is "abnormal", referred to cardiologist. Today, same chest pain with exertion while working outside, pt reports is lasted longer than usual. EXAM: CHEST - 2 VIEW COMPARISON:  07/17/2012 FINDINGS: The heart size is normal. The lungs are free of  focal consolidations and pleural effusions. No pulmonary edema. IMPRESSION: No active cardiopulmonary disease. Electronically Signed   By: Nolon Nations M.D.   On: 11/11/2017 17:06    Procedures Procedures (including critical care time)  Medications Ordered in ED Medications  heparin ADULT infusion 100 units/mL (25000 units/269mL sodium chloride 0.45%) (1,250 Units/hr Intravenous New Bag/Given 11/11/17 1856)  aspirin chewable tablet 243 mg (243 mg Oral Given 11/11/17 1842)  heparin bolus via infusion 4,000 Units (4,000 Units Intravenous Bolus from Bag 11/11/17 1857)  nitroGLYCERIN 50 mg in dextrose 5 % 250 mL (0.2 mg/mL) infusion (32 mcg/min Intravenous Rate/Dose Change 11/11/17 2009)     Initial Impression / Assessment and Plan / ED Course  I have reviewed the triage vital signs and the nursing notes.  Pertinent labs & imaging results that were available during my care of the patient were reviewed by me and considered in my medical decision making (see chart for details).  Patient presents to the emergency department for evaluation of exertional chest pain which started this afternoon while today this lasted longer than usual.  Currently chest pain-free.  No associated shortness of breath, lightheadedness, diaphoresis, nausea, vomiting or abdominal pain.  No prior cardiac history.  Does report that he was seen by his PCP on Friday with concern for some EKG abnormality and had been referred to cardiology for outpatient follow-up.  EKG here shows partial right bundle block but no other acute changes, no ST elevations or depressions.  Troponin is elevated at 0.32, there is no leukocytosis, hemoglobin is normal and there are no acute electrolyte derangements.  Chest x-ray shows no active cardiopulmonary disease.  Story is concerning for ACS given exertional chest pain that is worse than usual with elevated troponin, I have consulted cardiology and spoken with Dr. Neena Rhymes with cardiology who  will see and admit the patient with plan for cath in the next few days.  The patient has been started on a heparin drip.  On reevaluation patient reports some return of the chest tightness, he is mildly hypertensive and minimally tachycardic will start on nitro drip as well.   Final Clinical Impressions(s) / ED Diagnoses   Final diagnoses:  NSTEMI (non-ST elevated myocardial infarction) [facility])    ED Discharge Orders    None       Jacqlyn Larsen, [place] 11/12/17 0057    Drenda Freeze, MD 11/14/17 [phone]

## 2017-11-11 NOTE — Progress Notes (Signed)
ANTICOAGULATION CONSULT NOTE  Pharmacy Consult for heparin Indication: chest pain/ACS  Heparin Dosing Weight: 94.8 kg  Labs: Recent Labs    11/11/17 1613  HGB 16.2  HCT 50.4  PLT 317  CREATININE 1.07    Assessment: 65 yom presenting with CP, +troponin. Pharmacy consulted to dose heparin for ACS. Not on anticoagulation PTA. CBC wnl. No bleed documented.  Goal of Therapy:  Heparin level 0.3-0.7 units/ml Monitor platelets by anticoagulation protocol: Yes   Plan:  Heparin 4000 unit bolus Start heparin at 1250 units/h 6h heparin level Daily heparin level/CBC Monitor s/sx bleeding  Elicia Lamp, PharmD, BCPS Clinical Pharmacist 11/11/2017 6:08 PM

## 2017-11-12 DIAGNOSIS — I214 Non-ST elevation (NSTEMI) myocardial infarction: Principal | ICD-10-CM

## 2017-11-12 LAB — BASIC METABOLIC PANEL
Anion gap: 14 (ref 5–15)
BUN: 9 mg/dL (ref 6–20)
CO2: 22 mmol/L (ref 22–32)
Calcium: 9.5 mg/dL (ref 8.9–10.3)
Chloride: 104 mmol/L (ref 98–111)
Creatinine, Ser: 1.05 mg/dL (ref 0.61–1.24)
GFR calc Af Amer: >60 mL/min (ref >60)
GFR calc non Af Amer: >60 mL/min (ref >60)
Glucose, Bld: 104 mg/dL — ABNORMAL HIGH (ref 70–99)
Potassium: 4 mmol/L (ref 3.5–5.1)
Sodium: 140 mmol/L (ref 135–145)

## 2017-11-12 LAB — HEPATIC FUNCTION PANEL
ALT: 22 U/L (ref 0–44)
AST: 22 U/L (ref 15–41)
Albumin: 3.6 g/dL (ref 3.5–5.0)
Alkaline Phosphatase: 49 U/L (ref 38–126)
Bilirubin, Direct: 0.2 mg/dL (ref 0.0–0.2)
Indirect Bilirubin: 0.7 mg/dL (ref 0.3–0.9)
Total Bilirubin: 0.9 mg/dL (ref 0.3–1.2)
Total Protein: 6.3 g/dL — ABNORMAL LOW (ref 6.5–8.1)

## 2017-11-12 LAB — TROPONIN I
Troponin I: 0.22 ng/mL (ref <0.03)
Troponin I: 0.15 ng/mL (ref <0.03)
Troponin I: 0.12 ng/mL (ref <0.03)

## 2017-11-12 LAB — HEPARIN LEVEL (UNFRACTIONATED)
Heparin Unfractionated: 0.16 IU/mL — ABNORMAL LOW (ref 0.30–0.70)
Heparin Unfractionated: 0.26 IU/mL — ABNORMAL LOW (ref 0.30–0.70)

## 2017-11-12 LAB — MRSA PCR SCREENING: MRSA by PCR: NEGATIVE

## 2017-11-12 LAB — HIV ANTIBODY (ROUTINE TESTING W REFLEX): HIV Screen 4th Generation wRfx: NONREACTIVE

## 2017-11-12 MED ORDER — SODIUM CHLORIDE 0.9% FLUSH: 3.0000 mL | INTRAVENOUS | Status: DC | PRN

## 2017-11-12 MED ORDER — METOPROLOL SUCCINATE ER 25 MG PO TB24
25.0000 mg | ORAL_TABLET | Freq: Every day | ORAL | Status: DC
  Administered 2017-11-12 – 2017-11-13 (×2): 25 mg via ORAL
  Filled 2017-11-12 (×3): qty 1

## 2017-11-12 MED ORDER — SODIUM CHLORIDE 0.9 % WEIGHT BASED INFUSION
1.0000 mL/kg/h | INTRAVENOUS | Status: DC
  Administered 2017-11-13: 1 mL/kg/h via INTRAVENOUS

## 2017-11-12 MED ORDER — SODIUM CHLORIDE 0.9% FLUSH: 3.0000 mL | Freq: Two times a day (BID) | INTRAVENOUS | Status: DC

## 2017-11-12 MED ORDER — ALUM & MAG HYDROXIDE-SIMETH 200-200-20 MG/5ML PO SUSP
30.0000 mL | Freq: Once | ORAL | Status: AC
  Administered 2017-11-12: 30 mL via ORAL
  Filled 2017-11-12: qty 30

## 2017-11-12 MED ORDER — HEPARIN BOLUS VIA INFUSION
2000.0000 [IU] | Freq: Once | INTRAVENOUS | Status: AC
  Administered 2017-11-12: 2000 [IU] via INTRAVENOUS
  Filled 2017-11-12: qty 2000

## 2017-11-12 MED ORDER — SODIUM CHLORIDE 0.9 % WEIGHT BASED INFUSION
3.0000 mL/kg/h | INTRAVENOUS | Status: DC
  Administered 2017-11-13: 3 mL/kg/h via INTRAVENOUS

## 2017-11-12 MED ORDER — NITROGLYCERIN IN D5W 200-5 MCG/ML-% IV SOLN
0.0000 ug/min | INTRAVENOUS | Status: DC
  Administered 2017-11-12: 40 ug/min via INTRAVENOUS
  Filled 2017-11-12: qty 250

## 2017-11-12 MED ORDER — MORPHINE SULFATE (PF) 2 MG/ML IV SOLN
1.0000 mg | INTRAVENOUS | Status: DC | PRN
  Filled 2017-11-12: qty 1

## 2017-11-12 MED ORDER — SODIUM CHLORIDE 0.9 % IV SOLN: 250.0000 mL | INTRAVENOUS | Status: DC | PRN

## 2017-11-12 MED ORDER — ORAL CARE MOUTH RINSE
15.0000 mL | Freq: Two times a day (BID) | OROMUCOSAL | Status: DC
  Administered 2017-11-12 – 2017-11-14 (×4): 15 mL via OROMUCOSAL

## 2017-11-12 NOTE — Progress Notes (Signed)
Pt called nursing and requested another protonix or nexium for indigestion.  Pt reports he has had weakness, indigestion, gas, bilateral CP, left jaw pain with movement on and off throughout the day.  IV NTG/heparin infusing.  EKG done with T wave changes in I,II, and aVF.

## 2017-11-12 NOTE — Progress Notes (Signed)
[employer] for heparin Indication: chest pain/ACS  Heparin Dosing Weight: 94.8 kg  Labs: Recent Labs    11/11/17 1613  11/12/17 0325 11/12/17 0430 11/12/17 1016 11/12/17 1430  HGB 16.2  --   --   --   --   --   HCT 50.4  --   --   --   --   --   PLT 317  --   --   --   --   --   HEPARINUNFRC  --   --   --  0.16*  --  0.26*  CREATININE 1.07  --  1.05  --   --   --   TROPONINI  --    < > 0.22*  --  0.15* 0.12*   < > = values in this interval not displayed.    Assessment: 14 yom presenting with CP, +troponin. Pharmacy consulted to dose heparin for ACS. Not on anticoagulation PTA.  Heparin level improved but remains low after re-bolus and rate increase this AM. CBC wnl. No bleeding or issues with infusion per discussion with RN.  Goal of Therapy:  Heparin level 0.3-0.7 units/ml Monitor platelets by anticoagulation protocol: Yes   Plan:  Increase heparin to 1600 units/h 6h heparin level Daily heparin level/CBC Monitor s/sx bleeding Cath planned for Monday  Elicia Lamp, PharmD, BCPS Clinical Pharmacist 11/12/2017 4:08 PM

## 2017-11-12 NOTE — Progress Notes (Addendum)
Progress Note  Patient Name: David Conner Date of Encounter: 11/12/2017  Primary Cardiologist: New to [facility]  Subjective   Chest pain free. Troponin trending to 0.22 this morning. Remains on heparin and nitro gtts. BP soft in the 82U to 23N systolic overnight with current SBP of 107 mmHg. For [facility] 10/28. Echo pending.   Inpatient Medications    Scheduled Meds: . aspirin  81 mg Oral Daily  . atorvastatin  80 mg Oral q1800  . mouth rinse  15 mL Mouth Rinse BID  . metoprolol succinate  25 mg Oral Daily  . pantoprazole  80 mg Oral Q1200  . testosterone  5 g Transdermal Daily   Continuous Infusions: . heparin 1,400 Units/hr (11/12/17 3614)  . nitroGLYCERIN 20 mcg/min (11/12/17 0145)   PRN Meds: acetaminophen, nitroGLYCERIN, ondansetron (ZOFRAN) IV, zolpidem   Vital Signs    Vitals:   11/12/17 0415 11/12/17 0445 11/12/17 0522 11/12/17 0555  BP: (!) 88/61 101/70 (!) 95/59 107/71  Pulse: 79 77 86 82  Resp: 12 12 12 14   Temp:    98.4 F (36.9 C)  TempSrc:    Oral  SpO2: 97% 95% 94% 97%  Weight:    98 kg  Height:        Intake/Output Summary (Last 24 hours) at 11/12/2017 0834 Last data filed at 11/12/2017 0600 Gross per 24 hour  Intake 237.88 ml  Output -  Net 237.88 ml   Filed Weights   11/11/17 1800 11/12/17 0555  Weight: 103 kg 98 kg    Telemetry    NSR - Personally Reviewed  ECG    n/a - Personally Reviewed  Physical Exam   GEN: No acute distress.   Neck: No JVD. Cardiac: RRR, no murmurs, rubs, or gallops.  Respiratory: Clear to auscultation bilaterally.  GI: Soft, nontender, non-distended.   MS: No edema; No deformity. Neuro:  Alert and oriented x 3; Nonfocal.  Psych: Normal affect.  Labs    Chemistry Recent Labs  Lab 11/11/17 1613 11/12/17 0325  NA 139 140  K 4.0 4.0  CL 107 104  CO2 23 22  GLUCOSE 99 104*  BUN 8 9  CREATININE 1.07 1.05  CALCIUM 9.6 9.5  GFRNONAA >60 >60  GFRAA >60 >60  ANIONGAP 9 14      Hematology Recent Labs  Lab 11/11/17 1613  WBC 5.6  RBC 5.44  HGB 16.2  HCT 50.4  MCV 92.6  MCH 29.8  MCHC 32.1  RDW 11.8  PLT 317    Cardiac Enzymes Recent Labs  Lab 11/11/17 2043 11/12/17 0325  TROPONINI 0.18* 0.22*    Recent Labs  Lab 11/11/17 1626  TROPIPOC 0.32*     BNPNo results for input(s): BNP, PROBNP in the last 168 hours.   DDimer No results for input(s): DDIMER in the last 168 hours.   Radiology    Dg Chest 2 View  Result Date: 11/11/2017 IMPRESSION: No active cardiopulmonary disease. Electronically Signed   By: Nolon Nations M.D.   On: 11/11/2017 17:06    Cardiac Studies   Echo pending  Patient Profile     57 y.o. male with history of HLD, ED, GERD, and RLS admitted to [facility] with a NSTEMI.   Assessment & Plan    1. NSTEMI: -Currently, chest pain free -Troponin has trended to 0.22 this morning, continue to cycle until peak -Heparin gtt -Wean nitro gtt as tolerated today, this will help with his relative hypotension -ASA -For  LHC on 10/28 -Echo pending -Toprol XL 25 mg daily with hold parameters ordered for HR < 50 bpm and SBP < 90 mmHg -Lipitor 80 mg -A1c 5.3 -Risks and benefits of cardiac catheterization have been discussed with the patient including risks of bleeding, bruising, infection, kidney damage, stroke, heart attack, and death. The patient understands these risks and is willing to proceed with the procedure. All questions have been answered and concerns listened to  2. HLD: -LDL this admission 142 -Lipitor 80 mg daily -Check LFT  3. Relative hypotension: -In the setting of nitro gtt, wean as tolerated as above -Hold parameters placed on Toprol as above   For questions or updates, please contact [place] Please consult [email or web address] for contact info under Cardiology/STEMI.    Signed, Christell Faith, PA-C [place] Pager: [phone] 11/12/2017, 8:34 AM   Attending Note:   The patient was  seen and examined.  Agree with assessment and plan as noted above.  Changes made to the above note as needed.  Patient seen and independently examined with Christell Faith, PA .   We discussed all aspects of the encounter. I agree with the assessment and plan as stated above.  1.  Non-ST segment elevation myocardial infarction: Angus had been having several months of chest pain in his primary medical doctor had referred him to cardiology.  Now presents with chest pain that did not resolve when he opted to  rest.  Cardiac enzymes are positive.  Is now pain-free on heparin and nitroglycerin.  2.  Hyperlipidemia: His LDL is in the 140s.  He is now on atorvastatin 80 mg a day.  For heart cath more.  We discussed the risks, benefits, options.  He understands and agrees to proceed.   I have spent a total of 40 minutes with patient reviewing hospital  notes , telemetry, EKGs, labs and examining patient as well as establishing an assessment and plan that was discussed with the patient. > 50% of time was spent in direct patient care.   Thayer Headings, Brooke Bonito., MD, [facility] 11/12/2017, 11:10 AM [address]  [place] Pager [phone]

## 2017-11-12 NOTE — H&P (View-Only) (Signed)
Progress Note  Patient Name: David Conner Date of Encounter: 11/12/2017  Primary Cardiologist: New to [facility]  Subjective   Chest pain free. Troponin trending to 0.22 this morning. Remains on heparin and nitro gtts. BP soft in the 32K to 02R systolic overnight with current SBP of 107 mmHg. For [facility] 10/28. Echo pending.   Inpatient Medications    Scheduled Meds: . aspirin  81 mg Oral Daily  . atorvastatin  80 mg Oral q1800  . mouth rinse  15 mL Mouth Rinse BID  . metoprolol succinate  25 mg Oral Daily  . pantoprazole  80 mg Oral Q1200  . testosterone  5 g Transdermal Daily   Continuous Infusions: . heparin 1,400 Units/hr (11/12/17 4270)  . nitroGLYCERIN 20 mcg/min (11/12/17 0145)   PRN Meds: acetaminophen, nitroGLYCERIN, ondansetron (ZOFRAN) IV, zolpidem   Vital Signs    Vitals:   11/12/17 0415 11/12/17 0445 11/12/17 0522 11/12/17 0555  BP: (!) 88/61 101/70 (!) 95/59 107/71  Pulse: 79 77 86 82  Resp: 12 12 12 14   Temp:    98.4 F (36.9 C)  TempSrc:    Oral  SpO2: 97% 95% 94% 97%  Weight:    98 kg  Height:        Intake/Output Summary (Last 24 hours) at 11/12/2017 0834 Last data filed at 11/12/2017 0600 Gross per 24 hour  Intake 237.88 ml  Output -  Net 237.88 ml   Filed Weights   11/11/17 1800 11/12/17 0555  Weight: 103 kg 98 kg    Telemetry    NSR - Personally Reviewed  ECG    n/a - Personally Reviewed  Physical Exam   GEN: No acute distress.   Neck: No JVD. Cardiac: RRR, no murmurs, rubs, or gallops.  Respiratory: Clear to auscultation bilaterally.  GI: Soft, nontender, non-distended.   MS: No edema; No deformity. Neuro:  Alert and oriented x 3; Nonfocal.  Psych: Normal affect.  Labs    Chemistry Recent Labs  Lab 11/11/17 1613 11/12/17 0325  NA 139 140  K 4.0 4.0  CL 107 104  CO2 23 22  GLUCOSE 99 104*  BUN 8 9  CREATININE 1.07 1.05  CALCIUM 9.6 9.5  GFRNONAA >60 >60  GFRAA >60 >60  ANIONGAP 9 14      Hematology Recent Labs  Lab 11/11/17 1613  WBC 5.6  RBC 5.44  HGB 16.2  HCT 50.4  MCV 92.6  MCH 29.8  MCHC 32.1  RDW 11.8  PLT 317    Cardiac Enzymes Recent Labs  Lab 11/11/17 2043 11/12/17 0325  TROPONINI 0.18* 0.22*    Recent Labs  Lab 11/11/17 1626  TROPIPOC 0.32*     BNPNo results for input(s): BNP, PROBNP in the last 168 hours.   DDimer No results for input(s): DDIMER in the last 168 hours.   Radiology    Dg Chest 2 View  Result Date: 11/11/2017 IMPRESSION: No active cardiopulmonary disease. Electronically Signed   By: Nolon Nations M.D.   On: 11/11/2017 17:06    Cardiac Studies   Echo pending  Patient Profile     57 y.o. male with history of HLD, ED, GERD, and RLS admitted to [facility] with a NSTEMI.   Assessment & Plan    1. NSTEMI: -Currently, chest pain free -Troponin has trended to 0.22 this morning, continue to cycle until peak -Heparin gtt -Wean nitro gtt as tolerated today, this will help with his relative hypotension -ASA -For  LHC on 10/28 -Echo pending -Toprol XL 25 mg daily with hold parameters ordered for HR < 50 bpm and SBP < 90 mmHg -Lipitor 80 mg -A1c 5.3 -Risks and benefits of cardiac catheterization have been discussed with the patient including risks of bleeding, bruising, infection, kidney damage, stroke, heart attack, and death. The patient understands these risks and is willing to proceed with the procedure. All questions have been answered and concerns listened to  2. HLD: -LDL this admission 142 -Lipitor 80 mg daily -Check LFT  3. Relative hypotension: -In the setting of nitro gtt, wean as tolerated as above -Hold parameters placed on Toprol as above   For questions or updates, please contact [place] Please consult [email or web address] for contact info under Cardiology/STEMI.    Signed, Christell Faith, PA-C [place] Pager: [phone] 11/12/2017, 8:34 AM   Attending Note:   The patient was  seen and examined.  Agree with assessment and plan as noted above.  Changes made to the above note as needed.  Patient seen and independently examined with Christell Faith, PA .   We discussed all aspects of the encounter. I agree with the assessment and plan as stated above.  1.  Non-ST segment elevation myocardial infarction: David Conner had been having several months of chest pain in his primary medical doctor had referred him to cardiology.  Now presents with chest pain that did not resolve when he opted to  rest.  Cardiac enzymes are positive.  Is now pain-free on heparin and nitroglycerin.  2.  Hyperlipidemia: His LDL is in the 140s.  He is now on atorvastatin 80 mg a day.  For heart cath more.  We discussed the risks, benefits, options.  He understands and agrees to proceed.   I have spent a total of 40 minutes with patient reviewing hospital  notes , telemetry, EKGs, labs and examining patient as well as establishing an assessment and plan that was discussed with the patient. > 50% of time was spent in direct patient care.   Thayer Headings, Brooke Bonito., MD, [facility] 11/12/2017, 11:10 AM [address],  [place] Pager [phone]

## 2017-11-13 ENCOUNTER — Inpatient Hospital Stay (HOSPITAL_COMMUNITY): Payer: 59

## 2017-11-13 ENCOUNTER — Encounter (HOSPITAL_COMMUNITY): Payer: Self-pay | Admitting: General Practice

## 2017-11-13 ENCOUNTER — Inpatient Hospital Stay (HOSPITAL_COMMUNITY)
Admission: EM | Disposition: A | Discharge status: Home / Self Care | Payer: Self-pay | Source: Home / Self Care | Attending: Cardiovascular Disease

## 2017-11-13 DIAGNOSIS — I214 Non-ST elevation (NSTEMI) myocardial infarction: Secondary | ICD-10-CM

## 2017-11-13 HISTORY — PX: CORONARY STENT INTERVENTION: CATH118234

## 2017-11-13 HISTORY — PX: LEFT HEART CATH AND CORONARY ANGIOGRAPHY: CATH118249

## 2017-11-13 LAB — POCT ACTIVATED CLOTTING TIME
Activated Clotting Time: 246 seconds
Activated Clotting Time: 252 seconds

## 2017-11-13 LAB — TROPONIN I: Troponin I: 0.14 ng/mL (ref <0.03)

## 2017-11-13 LAB — CBC
HCT: 43.4 % (ref 39.0–52.0)
Hemoglobin: 14 g/dL (ref 13.0–17.0)
MCH: 29.4 pg (ref 26.0–34.0)
MCHC: 32.3 g/dL (ref 30.0–36.0)
MCV: 91 fL (ref 80.0–100.0)
Platelets: 301 10*3/uL (ref 150–400)
RBC: 4.77 MIL/uL (ref 4.22–5.81)
RDW: 11.7 % (ref 11.5–15.5)
WBC: 10.1 10*3/uL (ref 4.0–10.5)
nRBC: 0 % (ref 0.0–0.2)

## 2017-11-13 LAB — ECHOCARDIOGRAM COMPLETE
Height: 70 in
Weight: 3435.2 oz

## 2017-11-13 LAB — HEPARIN LEVEL (UNFRACTIONATED): Heparin Unfractionated: 0.42 IU/mL (ref 0.30–0.70)

## 2017-11-13 SURGERY — LEFT HEART CATH AND CORONARY ANGIOGRAPHY
Anesthesia: LOCAL

## 2017-11-13 MED ORDER — THE SENSUOUS HEART BOOK
Freq: Once | Status: AC
  Administered 2017-11-14: 05:00:00 1
  Filled 2017-11-13: qty 1

## 2017-11-13 MED ORDER — LABETALOL HCL 5 MG/ML IV SOLN: 10.0000 mg | INTRAVENOUS | Status: AC | PRN

## 2017-11-13 MED ORDER — FENTANYL CITRATE (PF) 100 MCG/2ML IJ SOLN
INTRAMUSCULAR | Status: DC | PRN
  Administered 2017-11-13 (×2): 25 ug via INTRAVENOUS

## 2017-11-13 MED ORDER — VERAPAMIL HCL 2.5 MG/ML IV SOLN
INTRAVENOUS | Status: AC
  Filled 2017-11-13: qty 2

## 2017-11-13 MED ORDER — HEPARIN (PORCINE) IN NACL 1000-0.9 UT/500ML-% IV SOLN
INTRAVENOUS | Status: DC | PRN
  Administered 2017-11-13 (×2): 500 mL

## 2017-11-13 MED ORDER — FAMOTIDINE 20 MG PO TABS
20.0000 mg | ORAL_TABLET | Freq: Two times a day (BID) | ORAL | Status: DC | PRN
  Administered 2017-11-13: 12:00:00 20 mg via ORAL
  Filled 2017-11-13: qty 1

## 2017-11-13 MED ORDER — HYDRALAZINE HCL 20 MG/ML IJ SOLN: 5.0000 mg | INTRAMUSCULAR | Status: AC | PRN

## 2017-11-13 MED ORDER — TIROFIBAN (AGGRASTAT) BOLUS VIA INFUSION
INTRAVENOUS | Status: DC | PRN
  Administered 2017-11-13: 2435 ug via INTRAVENOUS

## 2017-11-13 MED ORDER — SODIUM CHLORIDE 0.9% FLUSH
3.0000 mL | Freq: Two times a day (BID) | INTRAVENOUS | Status: DC
  Administered 2017-11-13 – 2017-11-14 (×2): 3 mL via INTRAVENOUS

## 2017-11-13 MED ORDER — TICAGRELOR 90 MG PO TABS
ORAL_TABLET | ORAL | Status: DC | PRN
  Administered 2017-11-13: 180 mg via ORAL

## 2017-11-13 MED ORDER — TICAGRELOR 90 MG PO TABS
90.0000 mg | ORAL_TABLET | Freq: Two times a day (BID) | ORAL | Status: DC
  Administered 2017-11-13 – 2017-11-14 (×2): 90 mg via ORAL
  Filled 2017-11-13 (×2): qty 1

## 2017-11-13 MED ORDER — TIROFIBAN HCL IN NACL 5-0.9 MG/100ML-% IV SOLN
INTRAVENOUS | Status: AC
  Filled 2017-11-13: qty 100

## 2017-11-13 MED ORDER — HEPARIN (PORCINE) IN NACL 1000-0.9 UT/500ML-% IV SOLN
INTRAVENOUS | Status: AC
  Filled 2017-11-13: qty 500

## 2017-11-13 MED ORDER — SODIUM CHLORIDE 0.9 % IV SOLN
INTRAVENOUS | Status: AC
  Administered 2017-11-13: 10:00:00 via INTRAVENOUS

## 2017-11-13 MED ORDER — ENOXAPARIN SODIUM 40 MG/0.4ML ~~LOC~~ SOLN
40.0000 mg | SUBCUTANEOUS | Status: DC
  Administered 2017-11-14: 08:00:00 40 mg via SUBCUTANEOUS
  Filled 2017-11-13: qty 0.4

## 2017-11-13 MED ORDER — TIROFIBAN HCL IN NACL 5-0.9 MG/100ML-% IV SOLN
INTRAVENOUS | Status: DC | PRN
  Administered 2017-11-13: 0.15 ug/kg/min via INTRAVENOUS

## 2017-11-13 MED ORDER — SODIUM CHLORIDE 0.9 % IV SOLN: 250.0000 mL | INTRAVENOUS | Status: DC | PRN

## 2017-11-13 MED ORDER — NITROGLYCERIN 1 MG/10 ML FOR IR/CATH LAB
INTRA_ARTERIAL | Status: DC | PRN
  Administered 2017-11-13: 200 ug via INTRACORONARY

## 2017-11-13 MED ORDER — MIDAZOLAM HCL 2 MG/2ML IJ SOLN
INTRAMUSCULAR | Status: DC | PRN
  Administered 2017-11-13: 1 mg via INTRAVENOUS

## 2017-11-13 MED ORDER — IOHEXOL 350 MG/ML SOLN
INTRAVENOUS | Status: DC | PRN
  Administered 2017-11-13: 145 mL via INTRA_ARTERIAL

## 2017-11-13 MED ORDER — HEPARIN SODIUM (PORCINE) 1000 UNIT/ML IJ SOLN
INTRAMUSCULAR | Status: DC | PRN
  Administered 2017-11-13 (×2): 5000 [IU] via INTRAVENOUS

## 2017-11-13 MED ORDER — HEPARIN SODIUM (PORCINE) 1000 UNIT/ML IJ SOLN
INTRAMUSCULAR | Status: AC
  Filled 2017-11-13: qty 1

## 2017-11-13 MED ORDER — FENTANYL CITRATE (PF) 100 MCG/2ML IJ SOLN
INTRAMUSCULAR | Status: AC
  Filled 2017-11-13: qty 2

## 2017-11-13 MED ORDER — TICAGRELOR 90 MG PO TABS
ORAL_TABLET | ORAL | Status: AC
  Filled 2017-11-13: qty 1

## 2017-11-13 MED ORDER — LIDOCAINE IN D5W 4-5 MG/ML-% IV SOLN
INTRAVENOUS | Status: AC
  Filled 2017-11-13: qty 500

## 2017-11-13 MED ORDER — LIDOCAINE HCL (PF) 1 % IJ SOLN
INTRAMUSCULAR | Status: AC
  Filled 2017-11-13: qty 30

## 2017-11-13 MED ORDER — MIDAZOLAM HCL 2 MG/2ML IJ SOLN
INTRAMUSCULAR | Status: AC
  Filled 2017-11-13: qty 2

## 2017-11-13 MED ORDER — NITROGLYCERIN 1 MG/10 ML FOR IR/CATH LAB
INTRA_ARTERIAL | Status: AC
  Filled 2017-11-13: qty 10

## 2017-11-13 MED ORDER — SODIUM CHLORIDE 0.9% FLUSH: 3.0000 mL | INTRAVENOUS | Status: DC | PRN

## 2017-11-13 MED ORDER — ANGIOPLASTY BOOK
Freq: Once | Status: AC
  Administered 2017-11-14: 05:00:00 1
  Filled 2017-11-13: qty 1

## 2017-11-13 MED ORDER — HEART ATTACK BOUNCING BOOK
Freq: Once | Status: AC
  Administered 2017-11-14: 1
  Filled 2017-11-13: qty 1

## 2017-11-13 SURGICAL SUPPLY — 20 items
BALLN SAPPHIRE 2.5X12 (BALLOONS) ×2
BALLN SAPPHIRE ~~LOC~~ 3.25X12 (BALLOONS) ×1 IMPLANT
BALLN WOLVERINE 2.50X10 (BALLOONS) ×2
BALLOON SAPPHIRE 2.5X12 (BALLOONS) IMPLANT
BALLOON WOLVERINE 2.50X10 (BALLOONS) IMPLANT
CATH 5FR JL3.5 JR4 ANG PIG MP (CATHETERS) ×1 IMPLANT
CATH VISTA GUIDE 6FR XBLAD3.5 (CATHETERS) ×1 IMPLANT
DEVICE RAD COMP TR BAND LRG (VASCULAR PRODUCTS) ×1 IMPLANT
GLIDESHEATH SLEND SS 6F .021 (SHEATH) ×1 IMPLANT
GUIDEWIRE INQWIRE 1.5J.035X260 (WIRE) IMPLANT
INQWIRE 1.5J .035X260CM (WIRE) ×2
KIT ENCORE 26 ADVANTAGE (KITS) ×1 IMPLANT
KIT HEART LEFT (KITS) ×2 IMPLANT
PACK CARDIAC CATHETERIZATION (CUSTOM PROCEDURE TRAY) ×2 IMPLANT
STENT RESOLUTE ONYX 3.0X18 (Permanent Stent) ×1 IMPLANT
SYR MEDRAD MARK V 150ML (SYRINGE) ×2 IMPLANT
TRANSDUCER W/STOPCOCK (MISCELLANEOUS) ×2 IMPLANT
TUBING CIL FLEX 10 FLL-RA (TUBING) ×2 IMPLANT
WIRE HI TORQ BMW 190CM (WIRE) ×1 IMPLANT
WIRE RUNTHROUGH .014X180CM (WIRE) ×1 IMPLANT

## 2017-11-13 NOTE — Interval H&P Note (Signed)
History and Physical Interval Note:  11/13/2017 7:24 AM  David Conner  has presented today for cardiac catheterization, with the diagnosis of NSTEMI  The various methods of treatment have been discussed with the patient and family. After consideration of risks, benefits and other options for treatment, the patient has consented to  Procedure(s): LEFT HEART CATH AND CORONARY ANGIOGRAPHY (N/A) as a surgical intervention .  The patient's history has been reviewed, patient examined, no change in status, stable for surgery.  I have reviewed the patient's chart and labs.  Questions were answered to the patient's satisfaction.    Cath Lab Visit (complete for each Cath Lab visit)  Clinical Evaluation Leading to the Procedure:   ACS: Yes.    Non-ACS:  N/A  [name]

## 2017-11-13 NOTE — Progress Notes (Signed)
TR BAND REMOVAL  LOCATION:  right radial  DEFLATED PER PROTOCOL:  Yes.    TIME BAND OFF / DRESSING APPLIED:   1330   SITE UPON ARRIVAL:   Level 0  SITE AFTER BAND REMOVAL:  Level 0  CIRCULATION SENSATION AND MOVEMENT:  Within Normal Limits  Yes.    COMMENTS:

## 2017-11-13 NOTE — Brief Op Note (Signed)
BRIEF CARDIAC CATHETERIZATION NOTE  11/13/2017  9:05 AM  PATIENT:  David Conner  57 y.o. male  PRE-OPERATIVE DIAGNOSIS:  NSTEMI  POST-OPERATIVE DIAGNOSIS:  NSTEMI  PROCEDURE:  Procedure(s): LEFT HEART CATH AND CORONARY ANGIOGRAPHY (N/A) CORONARY STENT INTERVENTION (N/A)  SURGEON:  Surgeon(s) and Role:    * Jeraldine Primeau, MD - Primary  FINDINGS: 1. Multivessel CAD including 99% mid LAD and CTO of distal LAD and D1. 2. 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  Mild to moderate, diffuse disease involving non-dominant RCA. 3. Mildly elevated LVEDP with normal LVEF. 4. Successful PCI to mid LAD using Resolute Onyx 3.0 x 18 mm DES with 0% residual stenosis and TIMI-3. 5. Occlusion of D1 could not be crossed with BMW wire, consistent with CTO.  RECOMMENDATIONS: 1. DAPT with ASA and ticagrelor for at least 12 months. 2. Aggressive secondary prevention. 3. Medical therapy of diagonal and LCx/OM disease. 4. Discontinue NTG infusion; consider adding Imdur if recurrent chest pain.  Nelva Bush, MD [facility] Pager: [phone]

## 2017-11-13 NOTE — Progress Notes (Signed)
   Chart reviewed, patient briefly seen post cath. 57 y.o. male with history of HLD, ED, GERD, and RLS admitted to [facility] with a NSTEMI. Peak troponin 0.22. He went to the lab today with multivessel CAD, s/p PCI to mLAD with DES. Occlusion of D1 could not be crossed with BMW wire, consistent with CTO. 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 LVEF normal with mildly LVEDP. Post PCI he is doing well. VSS. Will hold testosterone supp pending MD review in AM given possible associated CV risks now that documented CAD present. Cath site stable. Patient had numerous questions which I did my best to answer.   Aleena Kirkeby PA-C

## 2017-11-13 NOTE — Progress Notes (Signed)
  Echocardiogram 2D Echocardiogram has been performed.  David Conner 11/13/2017, 1:23 PM

## 2017-11-13 NOTE — Progress Notes (Signed)
ANTICOAGULATION CONSULT NOTE  Pharmacy Consult for heparin Indication: chest pain/ACS  Heparin Dosing Weight: 94.8 kg  Labs: Recent Labs    11/11/17 1613  11/12/17 0325 11/12/17 0430 11/12/17 1016 11/12/17 1430 11/12/17 2342  HGB 16.2  --   --   --   --   --  14.0  HCT 50.4  --   --   --   --   --  43.4  PLT 317  --   --   --   --   --  301  HEPARINUNFRC  --   --   --  0.16*  --  0.26* 0.42  CREATININE 1.07  --  1.05  --   --   --   --   TROPONINI  --    < > 0.22*  --  0.15* 0.12*  --    < > = values in this interval not displayed.    Assessment: 57 y.o. male with chest pain for heparin   Goal of Therapy:  Heparin level 0.3-0.7 units/ml Monitor platelets by anticoagulation protocol: Yes   Plan:  Continue Heparin at current rate   Phillis Knack, PharmD, BCPS  11/13/2017 12:59 AM

## 2017-11-14 ENCOUNTER — Encounter (HOSPITAL_COMMUNITY): Payer: Self-pay | Admitting: Internal Medicine

## 2017-11-14 ENCOUNTER — Telehealth: Payer: Self-pay | Admitting: Cardiology

## 2017-11-14 DIAGNOSIS — Z9861 Coronary angioplasty status: Secondary | ICD-10-CM

## 2017-11-14 DIAGNOSIS — E785 Hyperlipidemia, unspecified: Secondary | ICD-10-CM

## 2017-11-14 DIAGNOSIS — I2510 Atherosclerotic heart disease of native coronary artery without angina pectoris: Secondary | ICD-10-CM

## 2017-11-14 LAB — BASIC METABOLIC PANEL
Anion gap: 9 (ref 5–15)
BUN: 8 mg/dL (ref 6–20)
CO2: 24 mmol/L (ref 22–32)
Calcium: 9 mg/dL (ref 8.9–10.3)
Chloride: 106 mmol/L (ref 98–111)
Creatinine, Ser: 1.1 mg/dL (ref 0.61–1.24)
GFR calc Af Amer: >60 mL/min (ref >60)
GFR calc non Af Amer: >60 mL/min (ref >60)
Glucose, Bld: 118 mg/dL — ABNORMAL HIGH (ref 70–99)
Potassium: 3.9 mmol/L (ref 3.5–5.1)
Sodium: 139 mmol/L (ref 135–145)

## 2017-11-14 LAB — CBC
HCT: 43.7 % (ref 39.0–52.0)
Hemoglobin: 14.7 g/dL (ref 13.0–17.0)
MCH: 30.6 pg (ref 26.0–34.0)
MCHC: 33.6 g/dL (ref 30.0–36.0)
MCV: 90.9 fL (ref 80.0–100.0)
Platelets: 278 10*3/uL (ref 150–400)
RBC: 4.81 MIL/uL (ref 4.22–5.81)
RDW: 11.8 % (ref 11.5–15.5)
WBC: 9.8 10*3/uL (ref 4.0–10.5)
nRBC: 0 % (ref 0.0–0.2)

## 2017-11-14 MED ORDER — METOPROLOL SUCCINATE ER 50 MG PO TB24: 50.0000 mg | ORAL_TABLET | Freq: Every day | ORAL | 3 refills | Status: DC

## 2017-11-14 MED ORDER — TICAGRELOR 90 MG PO TABS: 90.0000 mg | ORAL_TABLET | Freq: Two times a day (BID) | ORAL | 9 refills | Status: DC

## 2017-11-14 MED ORDER — ATORVASTATIN CALCIUM 80 MG PO TABS: 80.0000 mg | ORAL_TABLET | Freq: Every day | ORAL | 3 refills | Status: DC

## 2017-11-14 MED ORDER — METOPROLOL SUCCINATE ER 50 MG PO TB24
50.0000 mg | ORAL_TABLET | Freq: Every day | ORAL | Status: DC
  Administered 2017-11-14: 12:00:00 50 mg via ORAL
  Filled 2017-11-14: qty 1

## 2017-11-14 MED FILL — BRILINTA 90 MG TABLET: 90 | 30 days supply | Qty: 60 | Fill #0 | Status: TO

## 2017-11-14 MED FILL — ATORVASTATIN CALCIUM 80 MG: 80 | 30 days supply | Qty: 30 | Fill #0

## 2017-11-14 MED FILL — METOPROLOL SUCCINATE ER 50: 50 | 30 days supply | Qty: 30 | Fill #0

## 2017-11-14 MED FILL — Lidocaine HCl Local Preservative Free (PF) Inj 1%: INTRAMUSCULAR | Qty: 30 | Status: AC

## 2017-11-14 NOTE — Discharge Summary (Signed)
Discharge Summary    Patient ID: [name] MRN: [id number]; DOB: [birth year]  Admit date: 11/11/2017 Discharge date: 11/14/2017  Primary Care Provider: Orpah Melter, MD  Primary Cardiologist: Mertie Moores, MD   Discharge Diagnoses    Principal Problem:   NSTEMI (non-ST elevated myocardial infarction) [facility]) Active Problems:   Hyperlipemia   CAD S/P percutaneous coronary angioplasty  Allergies Allergies  Allergen Reactions  . Lansoprazole Other (See Comments)    Dizziness   . Ofloxacin Other (See Comments)    insomnia  . Penicillin G Hives    Childhood allergic reaction Has patient had a PCN reaction causing immediate rash, facial/tongue/throat swelling, SOB or lightheadedness with hypotension: Yes Has patient had a PCN reaction causing severe rash involving mucus membranes or skin necrosis: No Has patient had a PCN reaction that required hospitalization: No Has patient had a PCN reaction occurring within the last 10 years: No If all of the above answers are "NO", then may proceed with Cephalosporin use.  David Conner Anxiety   Diagnostic Studies/Procedures   Cardiac catheterization 11/13/17:  Conclusions: 1. Multivessel CAD including 99% mid LAD and chronic total occlusion of distal LAD and D1. 2. 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 Mild to moderate, diffuse disease involving non-dominant RCA. 3. Mildly elevated LVEDP with normal left ventricular ejection fraction. 4. Successful PCI to mid LAD using Resolute Onyx 3.0 x 18 mm DES with 0% residual stenosis and TIMI-3. 5. Occlusion of D1 could not be crossed with BMW wire, consistent with CTO.  Recommendations: 1. Aggressive secondary prevention. 2. Medical therapy of diagonal and LCx/OM disease. 3. Discontinue NTG infusion; consider adding isosorbide mononitrate if chest pain recurs.  Recommend uninterrupted dual antiplatelet therapy with Aspirin 81mg  daily and Ticagrelor 90mg  twice daily for a  minimum of 12 months (ACS - Class I recommendation).  _____________  Echocardiogram 11/14/17:  Study Conclusions  - Left ventricle: The cavity size was normal. Wall thickness was   increased in a pattern of mild LVH. Systolic function was normal.   The estimated ejection fraction was in the range of 60% to 65%.   The study is not technically sufficient to allow evaluation of LV   diastolic function.   History of Present Illness     David Conner is a 57 y.o. male w/ no cardiac history who presents with chest pain on 11/11/17.   Patient reported waxing and waning central chest tightness over the past several months. His symptoms typically were exacerbated by exertion and improved with rest. He was seen by his family physician recently who referred him to cardiology. The patient became concerned with worsening central chest tightness and thus came to the emergency department for evaluation.  In the emergency department, his ECG was normal but his initial troponin was found to be elevated. Subsequent draws were elevated at 0.18>0.22>0.15>0.12>0.14. He was given aspirin and started on a heparin drip. Plans were made for cardiac catheterization for more definitive evaluation.   Hospital Course    On 11/13/17 he was taken to the cath lab which showed multivessel CAD including 99%, mid LAD and chronic total occlusion of distal LAD and D1, moderate to severe OM disease and mild to moderate, diffuse disease involving non-dominant RCA. A successful PCI to mid LAD was completed without complication. Occlusion of D1 could not be crossed with BMW wire, consistent with CTO. Recommendations were made for aggressive secondary prevention and continued medical therapy of diagonal and LCX/OM disease. Consider adding Imdur if recurs. He  will need DAPT with ASA and Brilinta for a minimum of 12 months duration. Echocardiogram revealed LV EF 60-65% and NWMA. Cath site has remained unremarkable.   Other  hospital problems include: -HLD- LDL was found to be elevated in the 140's. He was started on atorvastatin 80mg  daily  Discharge medications include: Toprol-XL 50 mg daily, Lipitor 80 mg daily, ASA 81 mg daily, Brilinta 90 mg twice daily   Consultants: None   The patient was seen and examined by Dr. Margaretann Loveless who feels that he is stable and ready for discharge on 11/14/2017.  He has ambulated with cardiac rehab without complication.  Cath site remains unremarkable.  He has been set up for close follow-up post discharge. _____________  Discharge Vitals Blood pressure (!) 141/97, pulse 87, temperature 98.5 F (36.9 C), temperature source Oral, resp. rate 13, height 5\' 10"  (1.778 m), weight 98 kg, SpO2 96 %.  Filed Weights   11/13/17 0500 11/13/17 0514 11/14/17 0531  Weight: 97.4 kg 97.4 kg 98 kg   Labs & Radiologic Studies    CBC Recent Labs    11/12/17 2342 11/14/17 0615  WBC 10.1 9.8  HGB 14.0 14.7  HCT 43.4 43.7  MCV 91.0 90.9  PLT 301 287   Basic Metabolic Panel Recent Labs    11/12/17 0325 11/14/17 0615  NA 140 139  K 4.0 3.9  CL 104 106  CO2 22 24  GLUCOSE 104* 118*  BUN 9 8  CREATININE 1.05 1.10  CALCIUM 9.5 9.0   Liver Function Tests Recent Labs    11/12/17 1016  AST 22  ALT 22  ALKPHOS 49  BILITOT 0.9  PROT 6.3*  ALBUMIN 3.6   Cardiac Enzymes Recent Labs    11/12/17 1016 11/12/17 1430 11/12/17 2342  TROPONINI 0.15* 0.12* 0.14*   Hemoglobin A1C Recent Labs    11/11/17 2043  HGBA1C 5.3   Fasting Lipid Panel Recent Labs    11/11/17 2043  CHOL 191  HDL 41  LDLCALC 142*  TRIG 38  CHOLHDL 4.7   _____________  Dg Chest 2 View  Result Date: 11/11/2017 CLINICAL DATA:  Pt c/o chest pain with exertion x " a while". He has seen his PCP for same, reports his EKG is "abnormal", referred to cardiologist. Today, same chest pain with exertion while working outside, pt reports is lasted longer than usual. EXAM: CHEST - 2 VIEW COMPARISON:   07/17/2012 FINDINGS: The heart size is normal. The lungs are free of focal consolidations and pleural effusions. No pulmonary edema. IMPRESSION: No active cardiopulmonary disease. Electronically Signed   By: Nolon Nations M.D.   On: 11/11/2017 17:06   Disposition   Pt is being discharged home today in good condition.  Follow-up Plans & Appointments   Follow-up Information    Daune Perch, NP Follow up on 11/23/2017.   Specialty:  Nurse Practitioner Why:  Follow-up appointment will be on 11/23/2017 at 10 AM. [place] arrive by 9:45 AM. Contact information: [place] [address] [phone]          Discharge Instructions    Amb Referral to Cardiac Rehabilitation   Complete by:  As directed    Diagnosis:   Coronary Stents NSTEMI     Call MD for:  difficulty breathing, headache or visual disturbances   Complete by:  As directed    Call MD for:  extreme fatigue   Complete by:  As directed    Call MD for:  hives  Complete by:  As directed    Call MD for:  persistant dizziness or light-headedness   Complete by:  As directed    Call MD for:  persistant nausea and vomiting   Complete by:  As directed    Call MD for:  redness, tenderness, or signs of infection (pain, swelling, redness, odor or green/yellow discharge around incision site)   Complete by:  As directed    Call MD for:  severe uncontrolled pain   Complete by:  As directed    Call MD for:  temperature >100.4   Complete by:  As directed    Diet - low sodium heart healthy   Complete by:  As directed    Discharge instructions   Complete by:  As directed    No driving for 3 days. No lifting over 5 lbs for 1 week. No sexual activity for 1 week. Keep procedure site clean & dry. If you notice increased pain, swelling, bleeding or pus, call/return!  You may shower, but no soaking baths/hot tubs/pools for 1 week.   PLEASE DO NOT MISS ANY DOSES OF YOUR BRILINTA!!!!! Also keep a log of you blood  pressures and bring back to your follow up appt. Please call the office with any questions.   Patients taking blood thinners should generally stay away from medicines like ibuprofen, Advil, Motrin, naproxen, and Aleve due to risk of stomach bleeding. You may take Tylenol as directed or talk to your primary doctor about alternatives.  Some studies suggest Prilosec/Omeprazole interacts with Plavix.   If you notice any bleeding such as blood in stool, black tarry stools, blood in urine, nosebleeds or any other unusual bleeding, call your doctor immediately.   Increase activity slowly   Complete by:  As directed      Discharge Medications   Allergies as of 11/14/2017      Reactions   Lansoprazole Other (See Comments)   Dizziness   Ofloxacin Other (See Comments)   insomnia   Penicillin G Hives   Childhood allergic reaction Has patient had a PCN reaction causing immediate rash, facial/tongue/throat swelling, SOB or lightheadedness with hypotension: Yes Has patient had a PCN reaction causing severe rash involving mucus membranes or skin necrosis: No Has patient had a PCN reaction that required hospitalization: No Has patient had a PCN reaction occurring within the last 10 years: No If all of the above answers are "NO", then may proceed with Cephalosporin use.   Versed [midazolam] Anxiety      Medication List    TAKE these medications   ANDROGEL PUMP 20.25 MG/ACT (1.62%) Gel Generic drug:  Testosterone Place 40.5 mg onto the skin daily. 2 pumps   aspirin EC 81 MG tablet Take 81 mg by mouth once.   atorvastatin 80 MG tablet Commonly known as:  LIPITOR Take 1 tablet (80 mg total) by mouth daily.   esomeprazole 40 MG capsule Commonly known as:  NEXIUM Take 40 mg by mouth daily.   metoprolol succinate 50 MG 24 hr tablet Commonly known as:  TOPROL-XL Take 1 tablet (50 mg total) by mouth daily. Take with or immediately following a meal. Start taking on:  11/15/2017     nitroGLYCERIN 0.3 MG SL tablet Commonly known as:  NITROSTAT Place 0.3 mg under the tongue every 5 (five) minutes as needed for chest pain.   ticagrelor 90 MG Tabs tablet Commonly known as:  BRILINTA Take 1 tablet (90 mg total) by mouth 2 (two) times daily.  zolpidem 10 MG tablet Commonly known as:  AMBIEN Take 10 mg by mouth at bedtime.       Acute coronary syndrome (MI, NSTEMI, STEMI, etc) this admission?: Yes.     AHA/ACC Clinical Performance & Quality Measures: 1. Aspirin prescribed? - Yes 2. ADP Receptor Inhibitor (Plavix/Clopidogrel, Brilinta/Ticagrelor or Effient/Prasugrel) prescribed (includes medically managed patients)? - Yes 3. Beta Blocker prescribed? - Yes 4. High Intensity Statin (Lipitor 40-80mg  or Crestor 20-40mg ) prescribed? - Yes 5. EF assessed during THIS hospitalization? - Yes 6. For EF <40%, was ACEI/ARB prescribed? - Not Applicable (EF >/= 91%) 7. For EF <40%, Aldosterone Antagonist (Spironolactone or Eplerenone) prescribed? - Not Applicable (EF >/= 91%) 8. Cardiac Rehab Phase II ordered (Included Medically managed Patients)? - Yes   Outstanding Labs/Studies   Will need LFTs in 4 to 6 weeks after start of atorvastatin  Duration of Discharge Encounter   Greater than 30 minutes including physician time.  Signed, Kathyrn Drown, NP 11/14/2017, 11:26 AM

## 2017-11-14 NOTE — Telephone Encounter (Signed)
TOC Patient- Please call Patient-  Patient has an appointment with [name] on 11-23-17.

## 2017-11-14 NOTE — Care Management (Signed)
Co-pay amount for Brilinta 90mg . BID is $35.00 for a 30 day supply. No PA required. Pharmacies:  [facility] or [facility] outpt. Pharmacy

## 2017-11-14 NOTE — Progress Notes (Signed)
CARDIAC REHAB PHASE I   PRE:  Rate/Rhythm: 86 SR  BP:  Sitting: 141/97      SaO2: 98 RA  MODE:  Ambulation: 1300 ft   POST:  Rate/Rhythm: 98 SR  BP:  Sitting: 145/98    SaO2: 99 RA   Pt ambulated 1311ft in hallway with fast steady gait. Pt denies CP or SOB. Encouraged pt to slow his pace and give his heart time to recover. Pt and wife educated on importance of ASA, Brilinta, statin, and NTG. Stent card and MI book at bedside. Pt given heart healthy diet. Reviewed restrictions and exercise guidelines. Will refer to CRP II [place] Rufina Falco, RN BSN 11/14/2017 8:45 AM

## 2017-11-14 NOTE — Progress Notes (Signed)
Progress Note  Patient Name: David Conner Date of Encounter: 11/14/2017  Primary Cardiologist: Dr. Margaretann Loveless  Subjective   Feels well, is ambulating around the room and no chest pain, shortness of breath.  Inpatient Medications    Scheduled Meds: . aspirin  81 mg Oral Daily  . atorvastatin  80 mg Oral q1800  . enoxaparin (LOVENOX) injection  40 mg Subcutaneous Q24H  . mouth rinse  15 mL Mouth Rinse BID  . metoprolol succinate  50 mg Oral Daily  . pantoprazole  80 mg Oral Q1200  . sodium chloride flush  3 mL Intravenous Q12H  . ticagrelor  90 mg Oral BID   Continuous Infusions: . sodium chloride     PRN Meds: sodium chloride, acetaminophen, famotidine, morphine injection, nitroGLYCERIN, ondansetron (ZOFRAN) IV, sodium chloride flush, zolpidem   Vital Signs    Vitals:   11/13/17 2000 11/13/17 2013 11/14/17 0531 11/14/17 0809  BP:  (!) 144/93 139/84 (!) 141/97  Pulse:  97 86 87  Resp: 15 15 13    Temp:  98 F (36.7 C) 98.8 F (37.1 C) 98.5 F (36.9 C)  TempSrc:  Oral Oral Oral  SpO2:  95% 95% 96%  Weight:   98 kg   Height:        Intake/Output Summary (Last 24 hours) at 11/14/2017 0819 Last data filed at 11/13/2017 2015 Gross per 24 hour  Intake 1320.22 ml  Output 1350 ml  Net -29.78 ml   Filed Weights   11/13/17 0500 11/13/17 0514 11/14/17 0531  Weight: 97.4 kg 97.4 kg 98 kg    Telemetry    NSR, two paired PVCs on one occasion - Personally Reviewed  ECG    n/a - Personally Reviewed  Physical Exam   GEN: No acute distress.   Neck: No JVD. Cardiac: RRR, no murmurs, rubs, or gallops.  Respiratory: Clear to auscultation bilaterally.  GI: Soft, nontender, non-distended.   MS: No edema; No deformity. Neuro:  Alert and oriented x 3; Nonfocal.  Psych: Normal affect.  Labs    Chemistry Recent Labs  Lab 11/11/17 1613 11/12/17 0325 11/12/17 1016 11/14/17 0615  NA 139 140  --  139  K 4.0 4.0  --  3.9  CL 107 104  --  106  CO2 23 22   --  24  GLUCOSE 99 104*  --  118*  BUN 8 9  --  8  CREATININE 1.07 1.05  --  1.10  CALCIUM 9.6 9.5  --  9.0  PROT  --   --  6.3*  --   ALBUMIN  --   --  3.6  --   AST  --   --  22  --   ALT  --   --  22  --   ALKPHOS  --   --  49  --   BILITOT  --   --  0.9  --   GFRNONAA >60 >60  --  >60  GFRAA >60 >60  --  >60  ANIONGAP 9 14  --  9     Hematology Recent Labs  Lab 11/11/17 1613 11/12/17 2342 11/14/17 0615  WBC 5.6 10.1 9.8  RBC 5.44 4.77 4.81  HGB 16.2 14.0 14.7  HCT 50.4 43.4 43.7  MCV 92.6 91.0 90.9  MCH 29.8 29.4 30.6  MCHC 32.1 32.3 33.6  RDW 11.8 11.7 11.8  PLT 317 301 278    Cardiac Enzymes Recent Labs  Lab 11/12/17 0325  11/12/17 1016 11/12/17 1430 11/12/17 2342  TROPONINI 0.22* 0.15* 0.12* 0.14*    Recent Labs  Lab 11/11/17 1626  TROPIPOC 0.32*     BNPNo results for input(s): BNP, PROBNP in the last 168 hours.   DDimer No results for input(s): DDIMER in the last 168 hours.   Radiology    Dg Chest 2 View  Result Date: 11/11/2017 IMPRESSION: No active cardiopulmonary disease. Electronically Signed   By: Nolon Nations M.D.   On: 11/11/2017 17:06    Cardiac Studies   Echo pending  Patient Profile     57 y.o. male with history of HLD, ED, GERD, and RLS admitted to [facility] with a NSTEMI.   Assessment & Plan    1. NSTEMI: -Normal LVEF.  -Toprol XL increased to 50 mg daily with hold parameters ordered for HR < 50 bpm and SBP < 90 mmHg -Lipitor 80 mg - Consider starting ACE/ARB if blood pressure tolerates, for additional blood pressure control -ASA 81 mg daily and Ticagrelor 90 mg BID -I have instructed the patient that dual antiplatelet therapy should be taken for 1 year without interruption.  We have discussed the consequences of interrupted dual antiplatelet therapy and the risk for in-stent thrombosis.  -cardiac rehab has been consulted.  2. HLD: -LDL this admission 142 -Lipitor 80 mg daily -Check LFT  3. Relative  hypotension: - using caution initiating medication therapy, will defer ace/arb to outpatient follow up.   Follow up with Dr. Margaretann Loveless in 1 month after hospital dismissal. Can be dismissed today.

## 2017-11-14 NOTE — Telephone Encounter (Signed)
The pt is being discharged from the hospital today. We will call him tomorrow 

## 2017-11-14 NOTE — Care Management Note (Signed)
Case Management Note  Patient Details  Name: David Conner MRN: [id number] Date of Birth: 02-14-1960  Subjective/Objective:   NSTEMI                 Action/Plan: Spoke to pt and wife, Hassan Rowan at bedside. Provided info on Brilinta copay $35. He will be able to use the Brilinta copay card $5. Will have [facility] deliver prior to dc for new Brilinta RX.   Expected Discharge Date:  11/14/2017             Expected Discharge Plan:  Home/Self Care  In-House Referral:  NA  Discharge planning Services  CM Consult  Post Acute Care Choice:  NA Choice offered to:  NA  DME Arranged:  N/A DME Agency:  NA  HH Arranged:  NA HH Agency:  NA  Status of Service:  Completed, signed off  If discussed at [place] of Stay Meetings, dates discussed:    Additional Comments:  Erenest Rasher, RN 11/14/2017, 10:01 AM

## 2017-11-15 NOTE — Telephone Encounter (Signed)
**Note De-identified Roshan Roback Obfuscation** TCM Call-1st attempt: I left a message on the pts VM asking him to call us back. 

## 2017-11-15 NOTE — Telephone Encounter (Signed)
Patient contacted regarding discharge from [facility] on 11/14/17.  Patient understands to follow up with provider Daune Perch, NP on 11/23/17 at 10:00 at [facility] in [place]. Patient understands discharge instructions? Yes Patient understands medications and regiment? Yes Patient understands to bring all medications to this visit? Yes

## 2017-11-16 ENCOUNTER — Telehealth: Payer: Self-pay | Admitting: Cardiovascular Disease

## 2017-11-16 NOTE — Telephone Encounter (Signed)
New message  Patient states that he sent a message thru mychart at 8:00 am this morning. He would like a call back as soon as possible.

## 2017-11-16 NOTE — Telephone Encounter (Signed)
Dr. Acie Fredrickson has answered patients questions via MyChart and patient has acknowledged receipt.

## 2017-11-17 ENCOUNTER — Telehealth (HOSPITAL_COMMUNITY): Payer: Self-pay

## 2017-11-17 NOTE — Telephone Encounter (Signed)
Made initial call to pt in regards to CR - pt is interested in the program. Explained scheduling process and went over insurance, pt verbalized understanding. Will contact patient for scheduling once f/u appt has been completed.

## 2017-11-17 NOTE — Telephone Encounter (Signed)
Pt's insurance is active and benefits verified through [facility].  $40.00 Co-pay, deductible amount of $250.00/$250.00, out of pocket amount $3,000/$655.74, no co-insurance, and no pre-authorization is required. Passport/reference # - [id number]  Will make initial call to pt in regards to Cardiac Rehab. If interested, pt will need to complete follow up appt. Once completed, pt will be contacted for scheduling.

## 2017-11-20 ENCOUNTER — Other Ambulatory Visit: Payer: Self-pay | Admitting: Nurse Practitioner

## 2017-11-20 DIAGNOSIS — I10 Essential (primary) hypertension: Secondary | ICD-10-CM

## 2017-11-20 MED ORDER — POTASSIUM CHLORIDE CRYS ER 20 MEQ PO TBCR: 20.0000 meq | EXTENDED_RELEASE_TABLET | Freq: Every day | ORAL | 3 refills | Status: DC

## 2017-11-20 MED ORDER — HYDROCHLOROTHIAZIDE 25 MG PO TABS: 25.0000 mg | ORAL_TABLET | Freq: Every day | ORAL | 3 refills | Status: DC

## 2017-11-20 MED FILL — POTASSIUM CL ER 20 MEQ TABL: 20 | 30 days supply | Qty: 30 | Fill #0

## 2017-11-20 MED FILL — HYDROCHLOROTHIAZIDE 25 MG T: 25 | 30 days supply | Qty: 30 | Fill #0

## 2017-11-23 ENCOUNTER — Encounter: Payer: Self-pay | Admitting: Cardiology

## 2017-11-23 ENCOUNTER — Ambulatory Visit (INDEPENDENT_AMBULATORY_CARE_PROVIDER_SITE_OTHER): Payer: 59 | Admitting: Cardiology

## 2017-11-23 VITALS — BP 122/82 | HR 90 | Ht 70.0 in | Wt 211.8 lb

## 2017-11-23 DIAGNOSIS — I10 Essential (primary) hypertension: Secondary | ICD-10-CM

## 2017-11-23 DIAGNOSIS — E782 Mixed hyperlipidemia: Secondary | ICD-10-CM | POA: Diagnosis not present

## 2017-11-23 DIAGNOSIS — I214 Non-ST elevation (NSTEMI) myocardial infarction: Secondary | ICD-10-CM | POA: Diagnosis not present

## 2017-11-23 MED ORDER — ISOSORBIDE MONONITRATE ER 30 MG PO TB24: 30.0000 mg | ORAL_TABLET | Freq: Every day | ORAL | 3 refills | Status: DC

## 2017-11-23 MED ORDER — METOPROLOL SUCCINATE ER 100 MG PO TB24: 100.0000 mg | ORAL_TABLET | Freq: Every day | ORAL | 3 refills | Status: DC

## 2017-11-23 MED FILL — ISOSORBIDE MN ER 30 MG TAB: 30 | 30 days supply | Qty: 30 | Fill #0

## 2017-11-23 MED FILL — METOPROLOL SUCCINATE ER 100: 100 | 30 days supply | Qty: 30 | Fill #0

## 2017-11-23 NOTE — Progress Notes (Signed)
Cardiology Office Note:    Date:  11/23/2017   ID:  David Conner, DOB July 23, 1960, MRN [id number]  PCP:  Orpah Melter, MD  Cardiologist:  Mertie Moores, MD  Referring MD: Orpah Melter, MD   Chief Complaint  Patient presents with  . Hospitalization Follow-up    MI with stent, ongoing chest discomfort    History of Present Illness:    David Conner is a 57 y.o. male with a past medical history significant for hyperlipidemia, ED, GERD, RLS and recent NSTEMI 11/11/17 who presents today for hospital follow-up.  His max troponin was 0.22.  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 A successful PCI to mid LAD was completed without complication. Occlusion of D1 could not be crossed with BMW wire, consistent with CTO. Recommendations were made for aggressive secondary prevention and continued medical therapy of diagonal and LCX/OM disease. Consider adding Imdur if recurs. He will need DAPT with ASA and Brilinta for a minimum of 12 months duration. Echocardiogram revealed LV EF 60-65% and NWMA.  Mr. David Conner is here today with his wife for complaints of elevated BP and intermittent chest tightness. He has been doing some gentle walking and went back to work on Monday and Tuesday. Today he reports that he is very nervous about his heart and what he is able to do. He has been taking his blood pressure 10-20 times per day and has noted some intermittent back pain and mild chest pressure mostly in the afternoon. Sometimes he feels it after some mild exertion like doing laundry or standing at the sink doing dishes. It does not last long and is not associated with any shortness of breath or other symptoms. He has not taken any nitroglycerin for it.   On Tuesday evening after working all day he went  to [place] for shopping and noted that he became very fatigued and had mild chest pressure and this scared him. He has not used any SL NTG.  He also notes that he has been awakening at night wet with sweat. He denies orthopnea, PND, lightheadedness or syncope. He has occasional feeling of skipped heart beat, no sustained palpitations.   He had called into the office with concerns of elevated BP and HCT with Kdur was called in for him, however, he has not picked it up from the pharmacy as he wanted to be checked out first. He has an extensive list of his BPs with most running in the 130's-70's-80's with occ up to 140's/90.   Past Medical History:  Diagnosis Date  . Allergy   . Complication of anesthesia    upper gi done, had anxiety with versed  . Coronary artery disease    11/13/17:PCI to mid LAD using Resolute Onyx 3.0 x 18 mm DES  . Depression    slight, on sertraline  . ED (erectile dysfunction)   . GERD (gastroesophageal reflux disease)   . Hyperlipidemia   . Nausea occasional  . Restless legs syndrome     Past Surgical History:  Procedure Laterality Date  . CHOLECYSTECTOMY N/A 02/18/2013   Procedure: LAPAROSCOPIC CHOLECYSTECTOMY WITH INTRAOPERATIVE CHOLANGIOGRAM, DIAGNOSTIC LAPAROSCOPY, LYSIS OF ADHESIONS ;  Surgeon: [name], MD;  Location: [facility];  Service: General;  Laterality: N/A;  . colonscopy  2 years ago  . CORONARY  STENT INTERVENTION  11/13/2017  . CORONARY STENT INTERVENTION N/A 11/13/2017   Procedure: CORONARY STENT INTERVENTION;  Surgeon: Nelva Bush, MD;  Location: [facility];  Service: Cardiovascular;  Laterality: N/A;  . cortisone injection Left 3 weeks ago   dr thinks is bursitis  . esophagus stretching     7-8 times  . LAPAROSCOPIC APPENDECTOMY N/A 02/18/2013   Procedure: APPENDECTOMY LAPAROSCOPIC;  Surgeon: [name], MD;  Location: [facility];  Service: General;  Laterality: N/A;  DR Redmond Pulling SPOKE WITH PT FAMILY DURING SURGERY ABOUT  REMOVING PT APPENDIX DUE TO MALROTATION. FAMILY STATED TO PROCEDE WITH REMOVAL OF APPENDIX.   [name] LEFT HEART CATH AND CORONARY ANGIOGRAPHY N/A 11/13/2017   Procedure: LEFT HEART CATH AND CORONARY ANGIOGRAPHY;  Surgeon: Nelva Bush, MD;  Location: [facility];  Service: Cardiovascular;  Laterality: N/A;    Current Medications: Current Meds  Medication Sig  . aspirin EC 81 MG tablet Take 81 mg by mouth once.  [name] atorvastatin (LIPITOR) 80 MG tablet Take 1 tablet (80 mg total) by mouth daily.  [name] esomeprazole (NEXIUM) 40 MG capsule Take 40 mg by mouth daily.   . metoprolol succinate (TOPROL-XL) 100 MG 24 hr tablet Take 1 tablet (100 mg total) by mouth daily. Take with or immediately following a meal.  . nitroGLYCERIN (NITROSTAT) 0.3 MG SL tablet Place 0.3 mg under the tongue every 5 (five) minutes as needed for chest pain.   . Testosterone (ANDROGEL PUMP) 20.25 MG/ACT (1.62%) GEL Place 40.5 mg onto the skin daily. 2 pumps  . ticagrelor (BRILINTA) 90 MG TABS tablet Take 1 tablet (90 mg total) by mouth 2 (two) times daily.  [name] zolpidem (AMBIEN) 10 MG tablet Take 10 mg by mouth at bedtime.  . [DISCONTINUED] hydrochlorothiazide (HYDRODIURIL) 25 MG tablet Take 1 tablet (25 mg total) by mouth daily.  . [DISCONTINUED] metoprolol succinate (TOPROL-XL) 50 MG 24 hr tablet Take 1 tablet (50 mg total) by mouth daily. Take with or immediately following a meal.  . [DISCONTINUED] potassium chloride SA (K-DUR,KLOR-CON) 20 MEQ tablet Take 1 tablet (20 mEq total) by mouth daily.     Allergies:   Lansoprazole; Ofloxacin; Penicillin g; and Versed [midazolam]   Social History   Socioeconomic History  . Marital status: Married    Spouse name: Not on file  . Number of children: Not on file  . Years of education: Not on file  . Highest education level: Not on file  Occupational History  . Not on file  Social Needs  . Financial resource strain: Not on file  . Food insecurity:    Worry: Not on file     Inability: Not on file  . Transportation needs:    Medical: Not on file    Non-medical: Not on file  Tobacco Use  . Smoking status: Never Smoker  . Smokeless tobacco: Never Used  Substance and Sexual Activity  . Alcohol use: No  . Drug use: No  . Sexual activity: Yes    Birth control/protection: Sponge  Lifestyle  . Physical activity:    Days per week: Not on file    Minutes per session: Not on file  . Stress: Not on file  Relationships  . Social connections:    Talks on phone: Not on file    Gets together: Not on file    Attends religious service: Not on file    Active member of club or organization: Not on file    Attends meetings  of clubs or organizations: Not on file    Relationship status: Not on file  Other Topics Concern  . Not on file  Social History Narrative  . Not on file     Family History: The patient's family history includes Bladder Cancer in his father; CAD in his maternal grandfather and mother; Diabetes in his father; Healthy in his sister; Hyperlipidemia in his mother; Hypertension in his father and mother. ROS:   Please see the history of present illness.     All other systems reviewed and are negative.  EKGs/Labs/Other Studies Reviewed:    The following studies were reviewed today:  Cardiac catheterization 11/13/17: Conclusions: 1. Multivessel CAD including 99% mid LAD and chronic total occlusion of distal LAD and D1. 2. 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 Mild to moderate, diffuse disease involving non-dominant RCA. 3. Mildly elevated LVEDP with normal left ventricular ejection fraction. 4. Successful PCI to mid LAD using Resolute Onyx 3.0 x 18 mm DES with 0% residual stenosis and TIMI-3. 5. Occlusion of D1 could not be crossed with BMW wire, consistent with CTO.  Recommendations: 1. Aggressive secondary prevention. 2. Medical therapy of diagonal and LCx/OM disease. 3. Discontinue NTG infusion; consider adding isosorbide mononitrate if chest pain  recurs.  Recommend uninterrupted dual antiplatelet therapy with Aspirin 81mg  daily and Ticagrelor 90mg  twice dailyfor a minimum of 12 months (ACS - Class I recommendation). _____________  Echocardiogram 11/14/17:  Study Conclusions - Left ventricle: The cavity size was normal. Wall thickness was increased in a pattern of mild LVH. Systolic function was normal. The estimated ejection fraction was in the range of 60% to 65%. The study is not technically sufficient to allow evaluation of LV diastolic function.    EKG:  EKG is  ordered today.  The ekg ordered today demonstrates NSR with diffuse T wave inversions.   Recent Labs: 11/12/2017: ALT 22 11/14/2017: BUN 8; Creatinine, Ser 1.10; Hemoglobin 14.7; Platelets 278; Potassium 3.9; Sodium 139   Recent Lipid Panel    Component Value Date/Time   CHOL 191 11/11/2017 2043   TRIG 38 11/11/2017 2043   HDL 41 11/11/2017 2043   CHOLHDL 4.7 11/11/2017 2043   VLDL 8 11/11/2017 2043   LDLCALC 142 (H) 11/11/2017 2043    Physical Exam:    VS:  BP 122/82   Pulse 90   Ht 5\' 10"  (1.778 m)   Wt 211 lb 12.8 oz (96.1 kg)   SpO2 98%   BMI 30.39 kg/m     Wt Readings from Last 3 Encounters:  11/23/17 211 lb 12.8 oz (96.1 kg)  11/14/17 216 lb 0.8 oz (98 kg)  03/01/13 210 lb 3.2 oz (95.3 kg)     Physical Exam  Constitutional: He is oriented to person, place, and time. He appears well-developed and well-nourished. No distress.  HENT:  Head: Normocephalic and atraumatic.  Neck: Normal range of motion. Neck supple. No JVD present.  Cardiovascular: Normal rate, regular rhythm, normal heart sounds and intact distal pulses. Exam reveals no gallop and no friction rub.  No murmur heard. Pulmonary/Chest: Effort normal and breath sounds normal. No respiratory distress. He has no wheezes. He has no rales.  Abdominal: Soft. Bowel sounds are normal.  Musculoskeletal: Normal range of motion. He exhibits no edema or deformity.    Neurological: He is alert and oriented to person, place, and time.  Skin: Skin is warm and dry.  Psychiatric: He has a normal mood and affect. His behavior is normal. Judgment and thought content  normal.  Vitals reviewed.    ASSESSMENT:    1. NSTEMI (non-ST elevated myocardial infarction) ([place])   2. Mixed hyperlipidemia   3. Essential (primary) hypertension    PLAN:    In order of problems listed above:  NSTEMI 11/11/17: [place] 11/12/2017 showed multivessel CAD, s/p PCI to mLAD with DES. Occlusion of D1 could not be crossed with BMW wire, consistent with CTO. 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 LVEF normal with mildly LVEDP.  -Medical therapy includes beta-blocker, statin, and dual antiplatelet therapy with aspirin and Brilinta for at least one year without interruption. -Pt returns today with complaints of occ mild chest pressure and he is very concerned about the residual disease that has not been intervened on. -Pt has new TWI, reviewed with Dr. Acie Fredrickson. Unclear significance. Possible reperfusion changes.  -Dr Acie Fredrickson and I reveiwed his cath films extensively and he does have other disease. The likely cause of his continued chest pressure is a very distal LAD lesion which is too small for intervention. He also has severe OM disease but this is a very small vessel and not likely to be intervened on successfully. I discussed this in detail with the pat and his wife along with plan for aggressive secondary prevention with BP and cholesterol management, diet and exercise. Will increase his Toprol and add Imdur for medical management.  -I reviewed proper use of Sl NTG and when to seek medical attention, ie. Chest pressure not resolved with rest/NTG that continues for over 10 minutes. I explained that if he does have significant chest pain esp if accompanied by SOB or other symptoms, not resolved with above, he may need to go back  for cath.  -Cardiac rehab phase 2 encouraged. I think this would greatly help his anxiety. Unfortunately, per pt there is a 2 month wait.  -Follow up in 1 week.  -Pt encouraged to see his PCP for management of his anxiety.   Hyperlipidemia - LDL was found to be elevated in the 140's. He was started on atorvastatin 80mg  daily -We will check follow-up lipid panel and LFTs in 6 weeks  Hypertension -On Toprol-XL 50 mg. Pt told not to start HCTZ as recently ordered but not picked up. I will increase BB and add Imdur as above.   Medication Adjustments/Labs and Tests Ordered: Current medicines are reviewed at length with the patient today.  Concerns regarding medicines are outlined above. Labs and tests ordered and medication changes are outlined in the patient instructions below:  Patient Instructions  Medication Instructions:  STOP: Hydrochlorothiazide  STOP: Potassium Chloride (KDur)  START: Isosorbide 30 MG daily  INCREASE: Metoprolol to 100 MG daily   If you need a refill on your cardiac medications before your next appointment, please call your pharmacy.   Lab work: Your physician recommends that you return for a FASTING lipid profile and Hepatic function test in 6 weeks  If you have labs (blood work) drawn today and your tests are completely normal, you will receive your results only by: [name] MyChart Message (if you have MyChart) OR . A paper copy in the mail If you have any lab test that is abnormal or we need to change your treatment, we will call you to review the results.  Testing/Procedures: None  Follow-Up: Please schedule a follow up in 1 week with [name], NP or Dr. Acie Fredrickson  Any Other Special Instructions Will Be Listed Below (If Applicable).  DASH  Eating Plan DASH stands for "Dietary Approaches to Stop Hypertension." The DASH eating plan is a healthy eating plan that has been shown to reduce high blood pressure (hypertension). It may also reduce your risk for type  2 diabetes, heart disease, and stroke. The DASH eating plan may also help with weight loss. What are tips for following this plan? General guidelines  Avoid eating more than 2,300 mg (milligrams) of salt (sodium) a day. If you have hypertension, you may need to reduce your sodium intake to 1,500 mg a day.  Limit alcohol intake to no more than 1 drink a day for nonpregnant women and 2 drinks a day for men. One drink equals 12 oz of beer, 5 oz of wine, or 1 oz of hard liquor.  Work with your health care provider to maintain a healthy body weight or to lose weight. Ask what an ideal weight is for you.  Get at least 30 minutes of exercise that causes your heart to beat faster (aerobic exercise) most days of the week. Activities may include walking, swimming, or biking.  Work with your health care provider or diet and nutrition specialist (dietitian) to adjust your eating plan to your individual calorie needs. Reading food labels  Check food labels for the amount of sodium per serving. Choose foods with less than 5 percent of the Daily Value of sodium. Generally, foods with less than 300 mg of sodium per serving fit into this eating plan.  To find whole grains, look for the word "whole" as the first word in the ingredient list. Shopping  Buy products labeled as "low-sodium" or "no salt added."  Buy fresh foods. Avoid canned foods and premade or frozen meals. Cooking  Avoid adding salt when cooking. Use salt-free seasonings or herbs instead of table salt or sea salt. Check with your health care provider or pharmacist before using salt substitutes.  Do not fry foods. Cook foods using healthy methods such as baking, boiling, grilling, and broiling instead.  Cook with heart-healthy oils, such as olive, canola, soybean, or sunflower oil. Meal planning   Eat a balanced diet that includes: ? 5 or more servings of fruits and vegetables each day. At each meal, try to fill half of your plate with  fruits and vegetables. ? Up to 6-8 servings of whole grains each day. ? Less than 6 oz of lean meat, poultry, or fish each day. A 3-oz serving of meat is about the same size as a deck of cards. One egg equals 1 oz. ? 2 servings of low-fat dairy each day. ? A serving of nuts, seeds, or beans 5 times each week. ? Heart-healthy fats. Healthy fats called Omega-3 fatty acids are found in foods such as flaxseeds and coldwater fish, like sardines, salmon, and mackerel.  Limit how much you eat of the following: ? Canned or prepackaged foods. ? Food that is high in trans fat, such as fried foods. ? Food that is high in saturated fat, such as fatty meat. ? Sweets, desserts, sugary drinks, and other foods with added sugar. ? Full-fat dairy products.  Do not salt foods before eating.  Try to eat at least 2 vegetarian meals each week.  Eat more home-cooked food and less restaurant, buffet, and fast food.  When eating at a restaurant, ask that your food be prepared with less salt or no salt, if possible. What foods are recommended? The items listed may not be a complete list. Talk with your dietitian about what  dietary choices are best for you. Grains Whole-grain or whole-wheat bread. Whole-grain or whole-wheat pasta. Brown rice. Modena Morrow. Bulgur. Whole-grain and low-sodium cereals. Pita bread. Low-fat, low-sodium crackers. Whole-wheat flour tortillas. Vegetables Fresh or frozen vegetables (raw, steamed, roasted, or grilled). Low-sodium or reduced-sodium tomato and vegetable juice. Low-sodium or reduced-sodium tomato sauce and tomato paste. Low-sodium or reduced-sodium canned vegetables. Fruits All fresh, dried, or frozen fruit. Canned fruit in natural juice (without added sugar). Meat and other protein foods Skinless chicken or [place]. Ground chicken or [place]. Pork with fat trimmed off. Fish and seafood. Egg whites. Dried beans, peas, or lentils. Unsalted nuts, nut butters, and seeds.  Unsalted canned beans. Lean cuts of beef with fat trimmed off. Low-sodium, lean deli meat. Dairy Low-fat (1%) or fat-free (skim) milk. Fat-free, low-fat, or reduced-fat cheeses. Nonfat, low-sodium ricotta or cottage cheese. Low-fat or nonfat yogurt. Low-fat, low-sodium cheese. Fats and oils Soft margarine without trans fats. Vegetable oil. Low-fat, reduced-fat, or light mayonnaise and salad dressings (reduced-sodium). Canola, safflower, olive, soybean, and sunflower oils. Avocado. Seasoning and other foods Herbs. Spices. Seasoning mixes without salt. Unsalted popcorn and pretzels. Fat-free sweets. What foods are not recommended? The items listed may not be a complete list. Talk with your dietitian about what dietary choices are best for you. Grains Baked goods made with fat, such as croissants, muffins, or some breads. Dry pasta or rice meal packs. Vegetables Creamed or fried vegetables. Vegetables in a cheese sauce. Regular canned vegetables (not low-sodium or reduced-sodium). Regular canned tomato sauce and paste (not low-sodium or reduced-sodium). Regular tomato and vegetable juice (not low-sodium or reduced-sodium). Angie Fava. Olives. Fruits Canned fruit in a light or heavy syrup. Fried fruit. Fruit in cream or butter sauce. Meat and other protein foods Fatty cuts of meat. Ribs. Fried meat. Berniece Salines. Sausage. Bologna and other processed lunch meats. Salami. Fatback. Hotdogs. Bratwurst. Salted nuts and seeds. Canned beans with added salt. Canned or smoked fish. Whole eggs or egg yolks. Chicken or [place] with skin. Dairy Whole or 2% milk, cream, and half-and-half. Whole or full-fat cream cheese. Whole-fat or sweetened yogurt. Full-fat cheese. Nondairy creamers. Whipped toppings. Processed cheese and cheese spreads. Fats and oils Butter. Stick margarine. Lard. Shortening. Ghee. Bacon fat. Tropical oils, such as coconut, palm kernel, or palm oil. Seasoning and other foods Salted popcorn and  pretzels. Onion salt, garlic salt, seasoned salt, table salt, and sea salt. Worcestershire sauce. Tartar sauce. Barbecue sauce. Teriyaki sauce. Soy sauce, including reduced-sodium. Steak sauce. Cann Coronary Artery Disease, Male Coronary artery disease (CAD) is a condition in which the arteries that lead to the heart (coronary arteries) become narrow or blocked. The narrowing or blockage can lead to decreased blood flow to the heart. Prolonged reduced blood flow can cause a heart attack (myocardial infarction or MI). This condition may also be called coronary heart disease. Because CAD is the leading cause of death in men, it is important to understand what causes this condition and how it is treated. What are the causes? CAD is most often caused by atherosclerosis. This is the buildup of fat and cholesterol (plaque) on the inside of the arteries. Over time, the plaque may narrow or block the artery, reducing blood flow to the heart. Plaque can also become weak and break off within a coronary artery and cause a sudden blockage. Other less common causes of CAD include:  An embolism or blood clot in a coronary artery.  A tearing of the artery (spontaneous coronary artery dissection).  An aneurysm.  Inflammation (vasculitis) in the artery wall.  What increases the risk? The following factors may make you more likely to develop this condition:  Age. Men over age 28 are at a greater risk of CAD.  Family history of CAD.  Gender. Men often develop CAD earlier in life than women.  High blood pressure (hypertension).  Diabetes.  High cholesterol levels.  Tobacco use.  Excessive alcohol use.  Lack of exercise.  A diet high in saturated and trans fats, such as fried food and processed meat.  Other possible risk factors include:  High stress levels.  Depression.  Obesity.  Sleep apnea.  What are the signs or symptoms? Many people do not have any symptoms during the early stages  of CAD. As the condition progresses, symptoms may include:  Chest pain (angina). The pain can: ? Feel like a crushing or squeezing, or a tightness, pressure, fullness, or heaviness in the chest. ? Last more than a few minutes or can stop and recur. The pain tends to get worse with exercise or stress and to fade with rest.  Pain in the arms, neck, jaw, or back.  Unexplained heartburn or indigestion.  Shortness of breath.  Nausea or vomiting.  Sudden light-headedness.  Sudden cold sweats.  Fluttering or fast heartbeat (palpitations).  How is this diagnosed? This condition is diagnosed based on:  Your family and medical history.  A physical exam.  Tests, including: ? A test to check the electrical signals in your heart (electrocardiogram). ? Exercise stress test. This looks for signs of blockage when the heart is stressed with exercise, such as running on a treadmill. ? Pharmacologic stress test. This test looks for signs of blockage when the heart is being stressed with a medicine. ? Blood tests. ? Coronary angiogram. This is a procedure to look at the coronary arteries to see if there is any blockage. During this test, a dye is injected into your arteries so they appear on an X-ray. ? A test that uses sound waves to take a picture of your heart (echocardiogram). ? Chest X-ray.  How is this treated? This condition may be treated by:  Healthy lifestyle changes to reduce risk factors.  Medicines such as: ? Antiplatelet medicines and blood-thinning medicines, such as aspirin. These help to prevent blood clots. ? Nitroglycerin. ? Blood pressure medicines. ? Cholesterol-lowering medicine.  Coronary angioplasty and stenting. During this procedure, a thin, flexible tube is inserted through a blood vessel and into a blocked artery. A balloon or similar device on the end of the tube is inflated to open up the artery. In some cases, a small, mesh tube (stent) is inserted into the  artery to keep it open.  Coronary artery bypass surgery. During this surgery, veins or arteries from other parts of the body are used to create a bypass around the blockage and allow blood to reach your heart.  Follow these instructions at home: Medicines  Take over-the-counter and prescription medicines only as told by your health care provider.  Do not take the following medicines unless your health care provider approves: ? NSAIDs, such as ibuprofen, naproxen, or celecoxib. ? Vitamin supplements that contain vitamin A, vitamin E, or both. Lifestyle  Follow an exercise program approved by your health care provider. Aim for 150 minutes of moderate exercise or 75 minutes of vigorous exercise each week.  Maintain a healthy weight or lose weight as approved by your health care provider.  Rest when you are tired.  Learn to  manage stress or try to limit your stress. Ask your health care provider for suggestions if you need help.  Get screened for depression and seek treatment, if needed.  Do not use any products that contain nicotine or tobacco, such as cigarettes and e-cigarettes. If you need help quitting, ask your health care provider.  Do not use illegal drugs. Eating and drinking  Follow a heart-healthy diet. A dietitian can help educate you about healthy food options and changes. In general, eat plenty of fruits and vegetables, lean meats, and whole grains.  Avoid foods high in: ? Sugar. ? Salt (sodium). ? Saturated fat, such as processed or fatty meat. ? Trans fat, such as fried foods.  Use healthy cooking methods such as roasting, grilling, broiling, baking, poaching, steaming, or stir-frying.  If you drink alcohol, and your health care provider approves, limit your alcohol intake to no more than 2 drinks per day. One drink equals 12 ounces of beer, 5 ounces of wine, or 1 ounces of hard liquor. General instructions  Manage any other health conditions, such as  hypertension and diabetes. These conditions affect your heart.  Your health care provider may ask you to monitor your blood pressure. Ideally, your blood pressure should be below 130/80.  Keep all follow-up visits as told by your health care provider. This is important. Get help right away if:  You have pain in your chest, neck, arm, jaw, stomach, or back that: ? Lasts more than a few minutes. ? Is recurring. ? Is not relieved by taking medicine under your tongue (sublingualnitroglycerin).  You have too much (profuse) sweating without cause.  You have unexplained: ? Heartburn or indigestion. ? Shortness of breath or difficulty breathing. ? Fluttering or fast heartbeat (palpitations). ? Nausea or vomiting. ? Fatigue. ? Feelings of nervousness or anxiety. ? Weakness. ? Diarrhea.  You have sudden light-headedness or dizziness.  You faint.  You feel like hurting yourself or think about taking your own life. These symptoms may represent a serious problem that is an emergency. Do not wait to see if the symptoms will go away. Get medical help right away. Call your local emergency services (911 in the [place] Do not drive yourself to the hospital. Summary  Coronary artery disease (CAD) is a process in which the arteries that lead to the heart (coronary arteries) become narrow or blocked. The narrowing or blockage can lead to a heart attack.  Many people do not have any symptoms during the early stages of CAD. This is called "silent CAD."  CAD can be treated with lifestyle changes, medicines, surgery, or a combination of these treatments. This information is not intended to replace advice given to you by your health care provider. Make sure you discuss any questions you have with your health care provider. Document Released: 07/31/2013 Document Revised: 12/25/2015 Document Reviewed: 12/25/2015 Elsevier Interactive Patient Education  2018 [employer]. ed and packaged gravies. Fish  sauce. Oyster sauce. Cocktail sauce. Horseradish that you find on the shelf. Ketchup. Mustard. Meat flavorings and tenderizers. Bouillon cubes. Hot sauce and Tabasco sauce. Premade or packaged marinades. Premade or packaged taco seasonings. Relishes. Regular salad dressings. Where to find more information:  National Heart, Lung, and [place]: https://wilson-eaton.com/  American Heart Association: www.heart.org Summary  The DASH eating plan is a healthy eating plan that has been shown to reduce high blood pressure (hypertension). It may also reduce your risk for type 2 diabetes, heart disease, and stroke.  With the DASH eating plan,  you should limit salt (sodium) intake to 2,300 mg a day. If you have hypertension, you may need to reduce your sodium intake to 1,500 mg a day.  When on the DASH eating plan, aim to eat more fresh fruits and vegetables, whole grains, lean proteins, low-fat dairy, and heart-healthy fats.  Work with your health care provider or diet and nutrition specialist (dietitian) to adjust your eating plan to your individual calorie needs. This information is not intended to replace advice given to you by your health care provider. Make sure you discuss any questions you have with your health care provider. Document Released: 12/23/2010 Document Revised: 12/28/2015 Document Reviewed: 12/28/2015 Elsevier Interactive Patient Education  2018 [employer].   Nitroglycerin sublingual tablets What is this medicine? NITROGLYCERIN (nye troe GLI ser in) is a type of vasodilator. It relaxes blood vessels, increasing the blood and oxygen supply to your heart. This medicine is used to relieve chest pain caused by angina. It is also used to prevent chest pain before activities like climbing stairs, going outdoors in cold weather, or sexual activity. This medicine may be used for other purposes; ask your health care provider or pharmacist if you have questions. COMMON BRAND NAME(S):  Nitroquick, Nitrostat, Nitrotab What should I tell my health care provider before I take this medicine? They need to know if you have any of these conditions: -anemia -head injury, recent stroke, or bleeding in the brain -liver disease -previous heart attack -an unusual or allergic reaction to nitroglycerin, other medicines, foods, dyes, or preservatives -pregnant or trying to get pregnant -breast-feeding How should I use this medicine? Take this medicine by mouth as needed. At the first sign of an angina attack (chest pain or tightness) place one tablet under your tongue. You can also take this medicine 5 to 10 minutes before an event likely to produce chest pain. Follow the directions on the prescription label. Let the tablet dissolve under the tongue. Do not swallow whole. Replace the dose if you accidentally swallow it. It will help if your mouth is not dry. Saliva around the tablet will help it to dissolve more quickly. Do not eat or drink, smoke or chew tobacco while a tablet is dissolving. If you are not better within 5 minutes after taking ONE dose of nitroglycerin, call 9-1-1 immediately to seek emergency medical care. Do not take more than 3 nitroglycerin tablets over 15 minutes. If you take this medicine often to relieve symptoms of angina, your doctor or health care professional may provide you with different instructions to manage your symptoms. If symptoms do not go away after following these instructions, it is important to call 9-1-1 immediately. Do not take more than 3 nitroglycerin tablets over 15 minutes. Talk to your pediatrician regarding the use of this medicine in children. Special care may be needed. Overdosage: If you think you have taken too much of this medicine contact a poison control center or emergency room at once. NOTE: This medicine is only for you. Do not share this medicine with others. What if I miss a dose? This does not apply. This medicine is only used as  needed. What may interact with this medicine? Do not take this medicine with any of the following medications: -certain migraine medicines like ergotamine and dihydroergotamine (DHE) -medicines used to treat erectile dysfunction like sildenafil, tadalafil, and vardenafil -riociguat This medicine may also interact with the following medications: -alteplase -aspirin -heparin -medicines for high blood pressure -medicines for mental depression -other medicines used  to treat angina -phenothiazines like chlorpromazine, mesoridazine, prochlorperazine, thioridazine This list may not describe all possible interactions. Give your health care provider a list of all the medicines, herbs, non-prescription drugs, or dietary supplements you use. Also tell them if you smoke, drink alcohol, or use illegal drugs. Some items may interact with your medicine. What should I watch for while using this medicine? Tell your doctor or health care professional if you feel your medicine is no longer working. Keep this medicine with you at all times. Sit or lie down when you take your medicine to prevent falling if you feel dizzy or faint after using it. Try to remain calm. This will help you to feel better faster. If you feel dizzy, take several deep breaths and lie down with your feet propped up, or bend forward with your head resting between your knees. You may get drowsy or dizzy. Do not drive, use machinery, or do anything that needs mental alertness until you know how this drug affects you. Do not stand or sit up quickly, especially if you are an older patient. This reduces the risk of dizzy or fainting spells. Alcohol can make you more drowsy and dizzy. Avoid alcoholic drinks. Do not treat yourself for coughs, colds, or pain while you are taking this medicine without asking your doctor or health care professional for advice. Some ingredients may increase your blood pressure. What side effects may I notice from  receiving this medicine? Side effects that you should report to your doctor or health care professional as soon as possible: -blurred vision -dry mouth -skin rash -sweating -the feeling of extreme pressure in the head -unusually weak or tired Side effects that usually do not require medical attention (report to your doctor or health care professional if they continue or are bothersome): -flushing of the face or neck -headache -irregular heartbeat, palpitations -nausea, vomiting This list may not describe all possible side effects. Call your doctor for medical advice about side effects. You may report side effects to FDA at 1-800-FDA-1088. Where should I keep my medicine? Keep out of the reach of children. Store at room temperature between 20 and 25 degrees C (68 and 77 degrees F). Store in [employer]. Protect from light and moisture. Keep tightly closed. Throw away any unused medicine after the expiration date. NOTE: This sheet is a summary. It may not cover all possible information. If you have questions about this medicine, talk to your doctor, pharmacist, or health care provider.  2018 Elsevier/Gold Standard (2012-11-01 17:57:36)           Signed, Daune Perch, NP  11/23/2017 1:51 PM    [facility]

## 2017-11-23 NOTE — Patient Instructions (Addendum)
Medication Instructions:  STOP: Hydrochlorothiazide  STOP: Potassium Chloride (KDur)  START: Isosorbide 30 MG daily  INCREASE: Metoprolol to 100 MG daily   If you need a refill on your cardiac medications before your next appointment, please call your pharmacy.   Lab work: Your physician recommends that you return for a FASTING lipid profile and Hepatic function test in 6 weeks  If you have labs (blood work) drawn today and your tests are completely normal, you will receive your results only by: [name] MyChart Message (if you have MyChart) OR . A paper copy in the mail If you have any lab test that is abnormal or we need to change your treatment, we will call you to review the results.  Testing/Procedures: None  Follow-Up: Please schedule a follow up in 1 week with [name], NP or Dr. Acie Fredrickson  Any Other Special Instructions Will Be Listed Below (If Applicable).  DASH Eating Plan DASH stands for "Dietary Approaches to Stop Hypertension." The DASH eating plan is a healthy eating plan that has been shown to reduce high blood pressure (hypertension). It may also reduce your risk for type 2 diabetes, heart disease, and stroke. The DASH eating plan may also help with weight loss. What are tips for following this plan? General guidelines  Avoid eating more than 2,300 mg (milligrams) of salt (sodium) a day. If you have hypertension, you may need to reduce your sodium intake to 1,500 mg a day.  Limit alcohol intake to no more than 1 drink a day for nonpregnant women and 2 drinks a day for men. One drink equals 12 oz of beer, 5 oz of wine, or 1 oz of hard liquor.  Work with your health care provider to maintain a healthy body weight or to lose weight. Ask what an ideal weight is for you.  Get at least 30 minutes of exercise that causes your heart to beat faster (aerobic exercise) most days of the week. Activities may include walking, swimming, or biking.  Work with your health care  provider or diet and nutrition specialist (dietitian) to adjust your eating plan to your individual calorie needs. Reading food labels  Check food labels for the amount of sodium per serving. Choose foods with less than 5 percent of the Daily Value of sodium. Generally, foods with less than 300 mg of sodium per serving fit into this eating plan.  To find whole grains, look for the word "whole" as the first word in the ingredient list. Shopping  Buy products labeled as "low-sodium" or "no salt added."  Buy fresh foods. Avoid canned foods and premade or frozen meals. Cooking  Avoid adding salt when cooking. Use salt-free seasonings or herbs instead of table salt or sea salt. Check with your health care provider or pharmacist before using salt substitutes.  Do not fry foods. Cook foods using healthy methods such as baking, boiling, grilling, and broiling instead.  Cook with heart-healthy oils, such as olive, canola, soybean, or sunflower oil. Meal planning   Eat a balanced diet that includes: ? 5 or more servings of fruits and vegetables each day. At each meal, try to fill half of your plate with fruits and vegetables. ? Up to 6-8 servings of whole grains each day. ? Less than 6 oz of lean meat, poultry, or fish each day. A 3-oz serving of meat is about the same size as a deck of cards. One egg equals 1 oz. ? 2 servings of low-fat dairy each day. ?  A serving of nuts, seeds, or beans 5 times each week. ? Heart-healthy fats. Healthy fats called Omega-3 fatty acids are found in foods such as flaxseeds and coldwater fish, like sardines, salmon, and mackerel.  Limit how much you eat of the following: ? Canned or prepackaged foods. ? Food that is high in trans fat, such as fried foods. ? Food that is high in saturated fat, such as fatty meat. ? Sweets, desserts, sugary drinks, and other foods with added sugar. ? Full-fat dairy products.  Do not salt foods before eating.  Try to eat at  least 2 vegetarian meals each week.  Eat more home-cooked food and less restaurant, buffet, and fast food.  When eating at a restaurant, ask that your food be prepared with less salt or no salt, if possible. What foods are recommended? The items listed may not be a complete list. Talk with your dietitian about what dietary choices are best for you. Grains Whole-grain or whole-wheat bread. Whole-grain or whole-wheat pasta. Brown rice. Modena Morrow. Bulgur. Whole-grain and low-sodium cereals. Pita bread. Low-fat, low-sodium crackers. Whole-wheat flour tortillas. Vegetables Fresh or frozen vegetables (raw, steamed, roasted, or grilled). Low-sodium or reduced-sodium tomato and vegetable juice. Low-sodium or reduced-sodium tomato sauce and tomato paste. Low-sodium or reduced-sodium canned vegetables. Fruits All fresh, dried, or frozen fruit. Canned fruit in natural juice (without added sugar). Meat and other protein foods Skinless chicken or [place]. Ground chicken or [place]. Pork with fat trimmed off. Fish and seafood. Egg whites. Dried beans, peas, or lentils. Unsalted nuts, nut butters, and seeds. Unsalted canned beans. Lean cuts of beef with fat trimmed off. Low-sodium, lean deli meat. Dairy Low-fat (1%) or fat-free (skim) milk. Fat-free, low-fat, or reduced-fat cheeses. Nonfat, low-sodium ricotta or cottage cheese. Low-fat or nonfat yogurt. Low-fat, low-sodium cheese. Fats and oils Soft margarine without trans fats. Vegetable oil. Low-fat, reduced-fat, or light mayonnaise and salad dressings (reduced-sodium). Canola, safflower, olive, soybean, and sunflower oils. Avocado. Seasoning and other foods Herbs. Spices. Seasoning mixes without salt. Unsalted popcorn and pretzels. Fat-free sweets. What foods are not recommended? The items listed may not be a complete list. Talk with your dietitian about what dietary choices are best for you. Grains Baked goods made with fat, such as croissants,  muffins, or some breads. Dry pasta or rice meal packs. Vegetables Creamed or fried vegetables. Vegetables in a cheese sauce. Regular canned vegetables (not low-sodium or reduced-sodium). Regular canned tomato sauce and paste (not low-sodium or reduced-sodium). Regular tomato and vegetable juice (not low-sodium or reduced-sodium). Angie Fava. Olives. Fruits Canned fruit in a light or heavy syrup. Fried fruit. Fruit in cream or butter sauce. Meat and other protein foods Fatty cuts of meat. Ribs. Fried meat. Berniece Salines. Sausage. Bologna and other processed lunch meats. Salami. Fatback. Hotdogs. Bratwurst. Salted nuts and seeds. Canned beans with added salt. Canned or smoked fish. Whole eggs or egg yolks. Chicken or [place] with skin. Dairy Whole or 2% milk, cream, and half-and-half. Whole or full-fat cream cheese. Whole-fat or sweetened yogurt. Full-fat cheese. Nondairy creamers. Whipped toppings. Processed cheese and cheese spreads. Fats and oils Butter. Stick margarine. Lard. Shortening. Ghee. Bacon fat. Tropical oils, such as coconut, palm kernel, or palm oil. Seasoning and other foods Salted popcorn and pretzels. Onion salt, garlic salt, seasoned salt, table salt, and sea salt. Worcestershire sauce. Tartar sauce. Barbecue sauce. Teriyaki sauce. Soy sauce, including reduced-sodium. Steak sauce. Cann Coronary Artery Disease, Male Coronary artery disease (CAD) is a condition in which the arteries that lead  to the heart (coronary arteries) become narrow or blocked. The narrowing or blockage can lead to decreased blood flow to the heart. Prolonged reduced blood flow can cause a heart attack (myocardial infarction or MI). This condition may also be called coronary heart disease. Because CAD is the leading cause of death in men, it is important to understand what causes this condition and how it is treated. What are the causes? CAD is most often caused by atherosclerosis. This is the buildup of fat and cholesterol  (plaque) on the inside of the arteries. Over time, the plaque may narrow or block the artery, reducing blood flow to the heart. Plaque can also become weak and break off within a coronary artery and cause a sudden blockage. Other less common causes of CAD include:  An embolism or blood clot in a coronary artery.  A tearing of the artery (spontaneous coronary artery dissection).  An aneurysm.  Inflammation (vasculitis) in the artery wall.  What increases the risk? The following factors may make you more likely to develop this condition:  Age. Men over age 2 are at a greater risk of CAD.  Family history of CAD.  Gender. Men often develop CAD earlier in life than women.  High blood pressure (hypertension).  Diabetes.  High cholesterol levels.  Tobacco use.  Excessive alcohol use.  Lack of exercise.  A diet high in saturated and trans fats, such as fried food and processed meat.  Other possible risk factors include:  High stress levels.  Depression.  Obesity.  Sleep apnea.  What are the signs or symptoms? Many people do not have any symptoms during the early stages of CAD. As the condition progresses, symptoms may include:  Chest pain (angina). The pain can: ? Feel like a crushing or squeezing, or a tightness, pressure, fullness, or heaviness in the chest. ? Last more than a few minutes or can stop and recur. The pain tends to get worse with exercise or stress and to fade with rest.  Pain in the arms, neck, jaw, or back.  Unexplained heartburn or indigestion.  Shortness of breath.  Nausea or vomiting.  Sudden light-headedness.  Sudden cold sweats.  Fluttering or fast heartbeat (palpitations).  How is this diagnosed? This condition is diagnosed based on:  Your family and medical history.  A physical exam.  Tests, including: ? A test to check the electrical signals in your heart (electrocardiogram). ? Exercise stress test. This looks for signs of  blockage when the heart is stressed with exercise, such as running on a treadmill. ? Pharmacologic stress test. This test looks for signs of blockage when the heart is being stressed with a medicine. ? Blood tests. ? Coronary angiogram. This is a procedure to look at the coronary arteries to see if there is any blockage. During this test, a dye is injected into your arteries so they appear on an X-ray. ? A test that uses sound waves to take a picture of your heart (echocardiogram). ? Chest X-ray.  How is this treated? This condition may be treated by:  Healthy lifestyle changes to reduce risk factors.  Medicines such as: ? Antiplatelet medicines and blood-thinning medicines, such as aspirin. These help to prevent blood clots. ? Nitroglycerin. ? Blood pressure medicines. ? Cholesterol-lowering medicine.  Coronary angioplasty and stenting. During this procedure, a thin, flexible tube is inserted through a blood vessel and into a blocked artery. A balloon or similar device on the end of the tube is inflated to  open up the artery. In some cases, a small, mesh tube (stent) is inserted into the artery to keep it open.  Coronary artery bypass surgery. During this surgery, veins or arteries from other parts of the body are used to create a bypass around the blockage and allow blood to reach your heart.  Follow these instructions at home: Medicines  Take over-the-counter and prescription medicines only as told by your health care provider.  Do not take the following medicines unless your health care provider approves: ? NSAIDs, such as ibuprofen, naproxen, or celecoxib. ? Vitamin supplements that contain vitamin A, vitamin E, or both. Lifestyle  Follow an exercise program approved by your health care provider. Aim for 150 minutes of moderate exercise or 75 minutes of vigorous exercise each week.  Maintain a healthy weight or lose weight as approved by your health care provider.  Rest when  you are tired.  Learn to manage stress or try to limit your stress. Ask your health care provider for suggestions if you need help.  Get screened for depression and seek treatment, if needed.  Do not use any products that contain nicotine or tobacco, such as cigarettes and e-cigarettes. If you need help quitting, ask your health care provider.  Do not use illegal drugs. Eating and drinking  Follow a heart-healthy diet. A dietitian can help educate you about healthy food options and changes. In general, eat plenty of fruits and vegetables, lean meats, and whole grains.  Avoid foods high in: ? Sugar. ? Salt (sodium). ? Saturated fat, such as processed or fatty meat. ? Trans fat, such as fried foods.  Use healthy cooking methods such as roasting, grilling, broiling, baking, poaching, steaming, or stir-frying.  If you drink alcohol, and your health care provider approves, limit your alcohol intake to no more than 2 drinks per day. One drink equals 12 ounces of beer, 5 ounces of wine, or 1 ounces of hard liquor. General instructions  Manage any other health conditions, such as hypertension and diabetes. These conditions affect your heart.  Your health care provider may ask you to monitor your blood pressure. Ideally, your blood pressure should be below 130/80.  Keep all follow-up visits as told by your health care provider. This is important. Get help right away if:  You have pain in your chest, neck, arm, jaw, stomach, or back that: ? Lasts more than a few minutes. ? Is recurring. ? Is not relieved by taking medicine under your tongue (sublingualnitroglycerin).  You have too much (profuse) sweating without cause.  You have unexplained: ? Heartburn or indigestion. ? Shortness of breath or difficulty breathing. ? Fluttering or fast heartbeat (palpitations). ? Nausea or vomiting. ? Fatigue. ? Feelings of nervousness or anxiety. ? Weakness. ? Diarrhea.  You have sudden  light-headedness or dizziness.  You faint.  You feel like hurting yourself or think about taking your own life. These symptoms may represent a serious problem that is an emergency. Do not wait to see if the symptoms will go away. Get medical help right away. Call your local emergency services (911 in the [place] Do not drive yourself to the hospital. Summary  Coronary artery disease (CAD) is a process in which the arteries that lead to the heart (coronary arteries) become narrow or blocked. The narrowing or blockage can lead to a heart attack.  Many people do not have any symptoms during the early stages of CAD. This is called "silent CAD."  CAD can be treated with  lifestyle changes, medicines, surgery, or a combination of these treatments. This information is not intended to replace advice given to you by your health care provider. Make sure you discuss any questions you have with your health care provider. Document Released: 07/31/2013 Document Revised: 12/25/2015 Document Reviewed: 12/25/2015 Elsevier Interactive Patient Education  2018 [employer]. ed and packaged gravies. Fish sauce. Oyster sauce. Cocktail sauce. Horseradish that you find on the shelf. Ketchup. Mustard. Meat flavorings and tenderizers. Bouillon cubes. Hot sauce and Tabasco sauce. Premade or packaged marinades. Premade or packaged taco seasonings. Relishes. Regular salad dressings. Where to find more information:  National Heart, Lung, and [place]: https://wilson-eaton.com/  American Heart Association: www.heart.org Summary  The DASH eating plan is a healthy eating plan that has been shown to reduce high blood pressure (hypertension). It may also reduce your risk for type 2 diabetes, heart disease, and stroke.  With the DASH eating plan, you should limit salt (sodium) intake to 2,300 mg a day. If you have hypertension, you may need to reduce your sodium intake to 1,500 mg a day.  When on the DASH eating plan, aim to  eat more fresh fruits and vegetables, whole grains, lean proteins, low-fat dairy, and heart-healthy fats.  Work with your health care provider or diet and nutrition specialist (dietitian) to adjust your eating plan to your individual calorie needs. This information is not intended to replace advice given to you by your health care provider. Make sure you discuss any questions you have with your health care provider. Document Released: 12/23/2010 Document Revised: 12/28/2015 Document Reviewed: 12/28/2015 Elsevier Interactive Patient Education  2018 [employer].   Nitroglycerin sublingual tablets What is this medicine? NITROGLYCERIN (nye troe GLI ser in) is a type of vasodilator. It relaxes blood vessels, increasing the blood and oxygen supply to your heart. This medicine is used to relieve chest pain caused by angina. It is also used to prevent chest pain before activities like climbing stairs, going outdoors in cold weather, or sexual activity. This medicine may be used for other purposes; ask your health care provider or pharmacist if you have questions. COMMON BRAND NAME(S): Nitroquick, Nitrostat, Nitrotab What should I tell my health care provider before I take this medicine? They need to know if you have any of these conditions: -anemia -head injury, recent stroke, or bleeding in the brain -liver disease -previous heart attack -an unusual or allergic reaction to nitroglycerin, other medicines, foods, dyes, or preservatives -pregnant or trying to get pregnant -breast-feeding How should I use this medicine? Take this medicine by mouth as needed. At the first sign of an angina attack (chest pain or tightness) place one tablet under your tongue. You can also take this medicine 5 to 10 minutes before an event likely to produce chest pain. Follow the directions on the prescription label. Let the tablet dissolve under the tongue. Do not swallow whole. Replace the dose if you accidentally  swallow it. It will help if your mouth is not dry. Saliva around the tablet will help it to dissolve more quickly. Do not eat or drink, smoke or chew tobacco while a tablet is dissolving. If you are not better within 5 minutes after taking ONE dose of nitroglycerin, call 9-1-1 immediately to seek emergency medical care. Do not take more than 3 nitroglycerin tablets over 15 minutes. If you take this medicine often to relieve symptoms of angina, your doctor or health care professional may provide you with different instructions to manage your symptoms. If  symptoms do not go away after following these instructions, it is important to call 9-1-1 immediately. Do not take more than 3 nitroglycerin tablets over 15 minutes. Talk to your pediatrician regarding the use of this medicine in children. Special care may be needed. Overdosage: If you think you have taken too much of this medicine contact a poison control center or emergency room at once. NOTE: This medicine is only for you. Do not share this medicine with others. What if I miss a dose? This does not apply. This medicine is only used as needed. What may interact with this medicine? Do not take this medicine with any of the following medications: -certain migraine medicines like ergotamine and dihydroergotamine (DHE) -medicines used to treat erectile dysfunction like sildenafil, tadalafil, and vardenafil -riociguat This medicine may also interact with the following medications: -alteplase -aspirin -heparin -medicines for high blood pressure -medicines for mental depression -other medicines used to treat angina -phenothiazines like chlorpromazine, mesoridazine, prochlorperazine, thioridazine This list may not describe all possible interactions. Give your health care provider a list of all the medicines, herbs, non-prescription drugs, or dietary supplements you use. Also tell them if you smoke, drink alcohol, or use illegal drugs. Some items may  interact with your medicine. What should I watch for while using this medicine? Tell your doctor or health care professional if you feel your medicine is no longer working. Keep this medicine with you at all times. Sit or lie down when you take your medicine to prevent falling if you feel dizzy or faint after using it. Try to remain calm. This will help you to feel better faster. If you feel dizzy, take several deep breaths and lie down with your feet propped up, or bend forward with your head resting between your knees. You may get drowsy or dizzy. Do not drive, use machinery, or do anything that needs mental alertness until you know how this drug affects you. Do not stand or sit up quickly, especially if you are an older patient. This reduces the risk of dizzy or fainting spells. Alcohol can make you more drowsy and dizzy. Avoid alcoholic drinks. Do not treat yourself for coughs, colds, or pain while you are taking this medicine without asking your doctor or health care professional for advice. Some ingredients may increase your blood pressure. What side effects may I notice from receiving this medicine? Side effects that you should report to your doctor or health care professional as soon as possible: -blurred vision -dry mouth -skin rash -sweating -the feeling of extreme pressure in the head -unusually weak or tired Side effects that usually do not require medical attention (report to your doctor or health care professional if they continue or are bothersome): -flushing of the face or neck -headache -irregular heartbeat, palpitations -nausea, vomiting This list may not describe all possible side effects. Call your doctor for medical advice about side effects. You may report side effects to FDA at 1-800-FDA-1088. Where should I keep my medicine? Keep out of the reach of children. Store at room temperature between 20 and 25 degrees C (68 and 77 degrees F). Store in [employer]. Protect  from light and moisture. Keep tightly closed. Throw away any unused medicine after the expiration date. NOTE: This sheet is a summary. It may not cover all possible information. If you have questions about this medicine, talk to your doctor, pharmacist, or health care provider.  2018 Elsevier/Gold Standard (2012-11-01 17:57:36)

## 2017-11-24 ENCOUNTER — Telehealth: Payer: Self-pay | Admitting: Cardiovascular Disease

## 2017-11-24 NOTE — Telephone Encounter (Signed)
New message   Pt c/o medication issue:  1. Name of Medication: isosorbide mononitrate (IMDUR) 30 MG 24 hr tablet  2. How are you currently taking this medication (dosage and times per day)? 1 time a day  3. Are you having a reaction (difficulty breathing--STAT)?no   4. What is your medication issue?Patient states that this medication caused his bp to drop. Patient would like to know if he can continue to take this medication?    Pt c/o medication issue:  1. Name of Medication: metoprolol succinate (TOPROL-XL) 100 MG 24 hr tablet  2. How are you currently taking this medication (dosage and times per day)? (2)   50 mg pills in the morning.  3. Are you having a reaction (difficulty breathing--STAT)? No   4. What is your medication issue? This medication has been increased to 100mg . Patient wants to know if can continue to take this medication

## 2017-11-24 NOTE — Telephone Encounter (Signed)
I spoke to the patient who had concerns over his BP since being started on Imdur 30 mg 11/7 and a recent dosage increase of Metoprolol to 100 mg. (5:19 am 98/54 HR 74; 7 am 121/76 HR 78; 8 am 135/88 HR 87).  I spoke to Dr Acie Fredrickson who said that the patient needs to continue monitoring his BP and allow the medication to work.  I called the patient with Dr Elmarie Shiley suggestion.

## 2017-11-27 ENCOUNTER — Telehealth (HOSPITAL_COMMUNITY): Payer: Self-pay

## 2017-11-27 NOTE — Telephone Encounter (Signed)
Called patient to see if he was interested in participating in the Cardiac Rehab Program. Patient stated yes. Patient will come in for orientation on 01/25/2018 @ 8:30AM and will attend the 11:15AM exercise class.  Mailed homework package.

## 2017-11-30 ENCOUNTER — Encounter: Payer: Self-pay | Admitting: Cardiovascular Disease

## 2017-11-30 NOTE — Telephone Encounter (Signed)
We have a spot on hold for him on Monday, 2pm.  We can schedule him in it tomorrow after you see him.  I replied to let him know.

## 2017-12-01 ENCOUNTER — Encounter: Payer: Self-pay | Admitting: Nurse Practitioner

## 2017-12-01 ENCOUNTER — Ambulatory Visit (INDEPENDENT_AMBULATORY_CARE_PROVIDER_SITE_OTHER): Payer: 59 | Admitting: Cardiovascular Disease

## 2017-12-01 ENCOUNTER — Encounter: Payer: Self-pay | Admitting: Cardiovascular Disease

## 2017-12-01 VITALS — BP 102/80 | HR 87 | Ht 70.0 in | Wt 213.0 lb

## 2017-12-01 DIAGNOSIS — I214 Non-ST elevation (NSTEMI) myocardial infarction: Secondary | ICD-10-CM | POA: Diagnosis not present

## 2017-12-01 DIAGNOSIS — I25110 Atherosclerotic heart disease of native coronary artery with unstable angina pectoris: Secondary | ICD-10-CM | POA: Diagnosis not present

## 2017-12-01 MED ORDER — ROSUVASTATIN CALCIUM 40 MG PO TABS: 40.0000 mg | ORAL_TABLET | Freq: Every day | ORAL | 3 refills | Status: DC

## 2017-12-01 MED FILL — ROSUVASTATIN CALCIUM 40 MG: 40 | 30 days supply | Qty: 30 | Fill #0

## 2017-12-01 NOTE — Patient Instructions (Addendum)
Medication Instructions:  Your physician has recommended you make the following change in your medication:   STOP Atorvastatin START Rosuvastatin 40 mg once daily  If you need a refill on your cardiac medications before your next appointment, please call your pharmacy.    Lab work: Your physician recommends that you return for lab work in: 3 months on the day of or a few days before your office visit with Dr. Acie Fredrickson.  You will need to FAST for this appointment - nothing to eat or drink after midnight the night before except water.    Testing/Procedures: Your physician has requested that you have an exercise tolerance test. For further information please visit [email or web address]. Please also follow instruction sheet, as given.    Follow-Up: At [facility], you and your health needs are our priority.  As part of our continuing mission to provide you with exceptional heart care, we have created designated Provider Care Teams.  These Care Teams include your primary Cardiologist (physician) and Advanced Practice Providers (APPs -  Physician Assistants and Nurse Practitioners) who all work together to provide you with the care you need, when you need it. You will need a follow up appointment in:  3 months.  Please call our office 2 months in advance to schedule this appointment.  You may see Mertie Moores, MD or one of the following Advanced Practice Providers on your designated Care Team: Richardson Dopp, PA-C [place], [place] . Daune Perch, NP

## 2017-12-01 NOTE — H&P (View-Only) (Signed)
Cardiology Office Note:    Date:  12/01/2017   ID:  David Conner, DOB [birth year], MRN [id number]  PCP:  Orpah Melter, MD  Cardiologist:  Mertie Moores, MD  Electrophysiologist:  None   Referring MD: Orpah Melter, MD   Chief Complaint  Patient presents with  . Coronary Artery Disease  . Chest Pain    Nov. 15, 2019   David Conner is a 57 y.o. male with a recent diagnosis of coronary artery disease.  He was admitted to the hospital in October.  He was found to have a tight LAD stenosis which was stented.  He has a chronic total occlusion of a moderate-sized diagonal vessel.  He also has moderate to severe disease in his distal right coronary artery and obtuse marginal artery.  David Conner to have episodes of chest discomfort.  He saw [name], NP week and his  Toprol-XL was  doubled and Imdur 30  was added . Also added Xanax   Has not had any syncope or presyncope  He seems to have fewer chest pains since the increase in meds  Also admits that he has not done as much     Past Medical History:  Diagnosis Date  . Allergy   . Complication of anesthesia    upper gi done, had anxiety with versed  . Coronary artery disease    11/13/17:PCI to mid LAD using Resolute Onyx 3.0 x 18 mm DES  . Depression    slight, on sertraline  . ED (erectile dysfunction)   . GERD (gastroesophageal reflux disease)   . Hyperlipidemia   . Nausea occasional  . Restless legs syndrome     Past Surgical History:  Procedure Laterality Date  . CHOLECYSTECTOMY N/A 02/18/2013   Procedure: LAPAROSCOPIC CHOLECYSTECTOMY WITH INTRAOPERATIVE CHOLANGIOGRAM, DIAGNOSTIC LAPAROSCOPY, LYSIS OF ADHESIONS ;  Surgeon: [name], MD;  Location: [facility];  Service: General;  Laterality: N/A;  . colonscopy  2 years ago  . CORONARY STENT INTERVENTION  11/13/2017  . CORONARY STENT INTERVENTION N/A 11/13/2017   Procedure: CORONARY STENT INTERVENTION;  Surgeon: Nelva Bush, MD;  Location: [place]  [facility];  Service: Cardiovascular;  Laterality: N/A;  . cortisone injection Left 3 weeks ago   dr thinks is bursitis  . esophagus stretching     7-8 times  . LAPAROSCOPIC APPENDECTOMY N/A 02/18/2013   Procedure: APPENDECTOMY LAPAROSCOPIC;  Surgeon: [name], MD;  Location: [facility];  Service: General;  Laterality: N/A;  DR Redmond Pulling SPOKE WITH PT FAMILY DURING SURGERY ABOUT REMOVING PT APPENDIX DUE TO MALROTATION. FAMILY STATED TO PROCEDE WITH REMOVAL OF APPENDIX.   [name] LEFT HEART CATH AND CORONARY ANGIOGRAPHY N/A 11/13/2017   Procedure: LEFT HEART CATH AND CORONARY ANGIOGRAPHY;  Surgeon: Nelva Bush, MD;  Location: [place] [facility];  Service: Cardiovascular;  Laterality: N/A;    Current Medications: Current Meds  Medication Sig  . aspirin EC 81 MG tablet Take 81 mg by mouth once.  [name] esomeprazole (NEXIUM) 40 MG capsule Take 40 mg by mouth daily.   . isosorbide mononitrate (IMDUR) 30 MG 24 hr tablet Take 1 tablet (30 mg total) by mouth daily.  . metoprolol succinate (TOPROL-XL) 100 MG 24 hr tablet Take 1 tablet (100 mg total) by mouth daily. Take with or immediately following a meal.  . nitroGLYCERIN (NITROSTAT) 0.3 MG SL tablet Place 0.3 mg under the tongue every 5 (five) minutes as needed for chest pain.   . Testosterone (ANDROGEL PUMP) 20.25 MG/ACT (  1.62%) GEL Place 40.5 mg onto the skin daily. 2 pumps  . ticagrelor (BRILINTA) 90 MG TABS tablet Take 1 tablet (90 mg total) by mouth 2 (two) times daily.  [name] zolpidem (AMBIEN) 10 MG tablet Take 10 mg by mouth at bedtime.  . [DISCONTINUED] atorvastatin (LIPITOR) 80 MG tablet Take 1 tablet (80 mg total) by mouth daily.     Allergies:   Lansoprazole; Ofloxacin; Penicillin g; and Versed [midazolam]   Social History   Socioeconomic History  . Marital status: Married    Spouse name: Not on file  . Number of children: Not on file  . Years of education: Not on file  . Highest education level: Not on file  Occupational History  . Not on  file  Social Needs  . Financial resource strain: Not on file  . Food insecurity:    Worry: Not on file    Inability: Not on file  . Transportation needs:    Medical: Not on file    Non-medical: Not on file  Tobacco Use  . Smoking status: Never Smoker  . Smokeless tobacco: Never Used  Substance and Sexual Activity  . Alcohol use: No  . Drug use: No  . Sexual activity: Yes    Birth control/protection: Sponge  Lifestyle  . Physical activity:    Days per week: Not on file    Minutes per session: Not on file  . Stress: Not on file  Relationships  . Social connections:    Talks on phone: Not on file    Gets together: Not on file    Attends religious service: Not on file    Active member of club or organization: Not on file    Attends meetings of clubs or organizations: Not on file    Relationship status: Not on file  Other Topics Concern  . Not on file  Social History Narrative  . Not on file     Family History: The patient's family history includes Bladder Cancer in his father; CAD in his maternal grandfather and mother; Diabetes in his father; Healthy in his sister; Hyperlipidemia in his mother; Hypertension in his father and mother.  ROS:      EKGs/Labs/Other Studies Reviewed:       EKG:     Recent Labs: 11/12/2017: ALT 22 11/14/2017: BUN 8; Creatinine, Ser 1.10; Hemoglobin 14.7; Platelets 278; Potassium 3.9; Sodium 139  Recent Lipid Panel    Component Value Date/Time   CHOL 191 11/11/2017 2043   TRIG 38 11/11/2017 2043   HDL 41 11/11/2017 2043   CHOLHDL 4.7 11/11/2017 2043   VLDL 8 11/11/2017 2043   LDLCALC 142 (H) 11/11/2017 2043    Physical Exam:    VS:  BP 102/80 (BP Location: Right Arm, Cuff Size: Normal)   Pulse 87   Ht 5\' 10"  (1.778 m)   Wt 213 lb (96.6 kg)   SpO2 98%   BMI 30.56 kg/m     Wt Readings from Last 3 Encounters:  12/01/17 213 lb (96.6 kg)  11/23/17 211 lb 12.8 oz (96.1 kg)  11/14/17 216 lb 0.8 oz (98 kg)     GEN:  Well  nourished, well developed middle aged man  in no acute distress HEENT: Normal NECK: No JVD; No carotid bruits LYMPHATICS: No lymphadenopathy CARDIAC: RRR, no murmurs, rubs, gallops RESPIRATORY:  Clear to auscultation without rales, wheezing or rhonchi  ABDOMEN: Soft, non-tender, non-distended MUSCULOSKELETAL:  No edema; No deformity  SKIN: Warm and dry NEUROLOGIC:  Alert and oriented x 3 PSYCHIATRIC:  Normal affect   ASSESSMENT:    No diagnosis found. PLAN:    In order of problems listed above:   1.  Coronary artery disease: Cordarryl presents today for work in visit for continued episodes of chest discomfort.  He is status post stenting of his left anterior descending artery.  He still has significant disease including a chronic total occlusion of his first diagonal artery, significant disease in his neck and obtuse marginal artery, and in his distal right coronary artery/posterior descending artery. He seems to be doing ok on medical therapy Has lots of anxiety related to his CAD Cannot tell when he is having mild angina vs.  A normal ache Will have him return or a treadmill next week - on medical therapy including metoprolol and imdur  Will see him in several months  Needs aggressive lipid lowering   2.  Hyperlipidemia: Needs aggressive lipid lowering. Will DC atorva and start Rosuvastatin 40 mg a day  Check lipds , liver enz in 2 months     Medication Adjustments/Labs and Tests Ordered: Current medicines are reviewed at length with the patient today.  Concerns regarding medicines are outlined above.  No orders of the defined types were placed in this encounter.  Meds ordered this encounter  Medications  . rosuvastatin (CRESTOR) 40 MG tablet    Sig: Take 1 tablet (40 mg total) by mouth daily.    Dispense:  90 tablet    Refill:  3    Patient Instructions  Medication Instructions:  Your physician has recommended you make the following change in your medication:    STOP Atorvastatin START Rosuvastatin 40 mg once daily  If you need a refill on your cardiac medications before your next appointment, please call your pharmacy.    Lab work: Your physician recommends that you return for lab work in: 3 months on the day of or a few days before your office visit with Dr. Acie Fredrickson.  You will need to FAST for this appointment - nothing to eat or drink after midnight the night before except water.    Testing/Procedures: Your physician has requested that you have an exercise tolerance test. For further information please visit HugeFiesta.[email or web address]. Please also follow instruction sheet, as given.    Follow-Up: At [facility], you and your health needs are our priority.  As part of our continuing mission to provide you with exceptional heart care, we have created designated Provider Care Teams.  These Care Teams include your primary Cardiologist (physician) and Advanced Practice Providers (APPs -  Physician Assistants and Nurse Practitioners) who all work together to provide you with the care you need, when you need it. You will need a follow up appointment in:  3 months.  Please call our office 2 months in advance to schedule this appointment.  You may see Mertie Moores, MD or one of the following Advanced Practice Providers on your designated Care Team: Richardson Dopp, PA-C [place], [place] . Daune Perch, NP      Signed, Mertie Moores, MD  12/01/2017 12:59 PM    [facility]   Addendum:  Tristyn has continued to have CP, / angina  We have performed a stress test during which she walked on a standard Bruce protocol for 9 minutes.  There were no ST changes.  He did not have any angina. I tried to reassure him that things appear to be stable but he continues to have lots  of angina with any amount of exercise.  We have added him to the catheterization schedule for November 26 at 7:30 AM. We discussed  risks, benefits,  options.  He understands and agrees to proceed.    Mertie Moores, MD  12/11/2017 9:51 AM    [address],  [place], [place]  [address] Pager [phone] Phone: [phone]; Fax: [phone]

## 2017-12-01 NOTE — Progress Notes (Addendum)
Cardiology Office Note:    Date:  12/01/2017   ID:  David Conner, DOB [birth year], MRN [id number]  PCP:  David Melter, MD  Cardiologist:  David Moores, MD  Electrophysiologist:  None   Referring MD: David Melter, MD   Chief Complaint  Patient presents with  . Coronary Artery Disease  . Chest Pain    Nov. 15, 2019   David Conner is a 57 y.o. male with a recent diagnosis of coronary artery disease.  He was admitted to the hospital in October.  He was found to have a tight LAD stenosis which was stented.  He has a chronic total occlusion of a moderate-sized diagonal vessel.  He also has moderate to severe disease in his distal right coronary artery and obtuse marginal artery.  David Conner to have episodes of chest discomfort.  He saw David Ades, NP week and his  Toprol-XL was  doubled and Imdur 30  was added . Also added Xanax   Has not had any syncope or presyncope  He seems to have fewer chest pains since the increase in meds  Also admits that he has not done as much     Past Medical History:  Diagnosis Date  . Allergy   . Complication of anesthesia    upper gi done, had anxiety with versed  . Coronary artery disease    11/13/17:PCI to mid LAD using Resolute Onyx 3.0 x 18 mm DES  . Depression    slight, on sertraline  . ED (erectile dysfunction)   . GERD (gastroesophageal reflux disease)   . Hyperlipidemia   . Nausea occasional  . Restless legs syndrome     Past Surgical History:  Procedure Laterality Date  . CHOLECYSTECTOMY N/A 02/18/2013   Procedure: LAPAROSCOPIC CHOLECYSTECTOMY WITH INTRAOPERATIVE CHOLANGIOGRAM, DIAGNOSTIC LAPAROSCOPY, LYSIS OF ADHESIONS ;  Surgeon: David Curry, MD;  Location: [facility];  Service: General;  Laterality: N/A;  . colonscopy  2 years ago  . CORONARY STENT INTERVENTION  11/13/2017  . CORONARY STENT INTERVENTION N/A 11/13/2017   Procedure: CORONARY STENT INTERVENTION;  Surgeon: David Bush, MD;  Location: [place]  [facility];  Service: Cardiovascular;  Laterality: N/A;  . cortisone injection Left 3 weeks ago   dr thinks is bursitis  . esophagus stretching     7-8 times  . LAPAROSCOPIC APPENDECTOMY N/A 02/18/2013   Procedure: APPENDECTOMY LAPAROSCOPIC;  Surgeon: David Curry, MD;  Location: [facility];  Service: General;  Laterality: N/A;  DR David Conner SPOKE WITH PT FAMILY DURING SURGERY ABOUT REMOVING PT APPENDIX DUE TO MALROTATION. FAMILY STATED TO PROCEDE WITH REMOVAL OF APPENDIX.   [name] LEFT HEART CATH AND CORONARY ANGIOGRAPHY N/A 11/13/2017   Procedure: LEFT HEART CATH AND CORONARY ANGIOGRAPHY;  Surgeon: David Bush, MD;  Location: [place] [facility];  Service: Cardiovascular;  Laterality: N/A;    Current Medications: Current Meds  Medication Sig  . aspirin EC 81 MG tablet Take 81 mg by mouth once.  [name] esomeprazole (NEXIUM) 40 MG capsule Take 40 mg by mouth daily.   . isosorbide mononitrate (IMDUR) 30 MG 24 hr tablet Take 1 tablet (30 mg total) by mouth daily.  . metoprolol succinate (TOPROL-XL) 100 MG 24 hr tablet Take 1 tablet (100 mg total) by mouth daily. Take with or immediately following a meal.  . nitroGLYCERIN (NITROSTAT) 0.3 MG SL tablet Place 0.3 mg under the tongue every 5 (five) minutes as needed for chest pain.   . Testosterone (ANDROGEL PUMP) 20.25 MG/ACT (  1.62%) GEL Place 40.5 mg onto the skin daily. 2 pumps  . ticagrelor (BRILINTA) 90 MG TABS tablet Take 1 tablet (90 mg total) by mouth 2 (two) times daily.  [name] zolpidem (AMBIEN) 10 MG tablet Take 10 mg by mouth at bedtime.  . [DISCONTINUED] atorvastatin (LIPITOR) 80 MG tablet Take 1 tablet (80 mg total) by mouth daily.     Allergies:   Lansoprazole; Ofloxacin; Penicillin g; and Versed [midazolam]   Social History   Socioeconomic History  . Marital status: Married    Spouse name: Not on file  . Number of children: Not on file  . Years of education: Not on file  . Highest education level: Not on file  Occupational History  . Not on  file  Social Needs  . Financial resource strain: Not on file  . Food insecurity:    Worry: Not on file    Inability: Not on file  . Transportation needs:    Medical: Not on file    Non-medical: Not on file  Tobacco Use  . Smoking status: Never Smoker  . Smokeless tobacco: Never Used  Substance and Sexual Activity  . Alcohol use: No  . Drug use: No  . Sexual activity: Yes    Birth control/protection: Sponge  Lifestyle  . Physical activity:    Days per week: Not on file    Minutes per session: Not on file  . Stress: Not on file  Relationships  . Social connections:    Talks on phone: Not on file    Gets together: Not on file    Attends religious service: Not on file    Active member of club or organization: Not on file    Attends meetings of clubs or organizations: Not on file    Relationship status: Not on file  Other Topics Concern  . Not on file  Social History Narrative  . Not on file     Family History: The patient's family history includes Bladder Cancer in his father; CAD in his maternal grandfather and mother; Diabetes in his father; Healthy in his sister; Hyperlipidemia in his mother; Hypertension in his father and mother.  ROS:      EKGs/Labs/Other Studies Reviewed:       EKG:     Recent Labs: 11/12/2017: ALT 22 11/14/2017: BUN 8; Creatinine, Ser 1.10; Hemoglobin 14.7; Platelets 278; Potassium 3.9; Sodium 139  Recent Lipid Panel    Component Value Date/Time   CHOL 191 11/11/2017 2043   TRIG 38 11/11/2017 2043   HDL 41 11/11/2017 2043   CHOLHDL 4.7 11/11/2017 2043   VLDL 8 11/11/2017 2043   LDLCALC 142 (H) 11/11/2017 2043    Physical Exam:    VS:  BP 102/80 (BP Location: Right Arm, Cuff Size: Normal)   Pulse 87   Ht 5\' 10"  (1.778 m)   Wt 213 lb (96.6 kg)   SpO2 98%   BMI 30.56 kg/m     Wt Readings from Last 3 Encounters:  12/01/17 213 lb (96.6 kg)  11/23/17 211 lb 12.8 oz (96.1 kg)  11/14/17 216 lb 0.8 oz (98 kg)     GEN:  Well  nourished, well developed middle aged man  in no acute distress HEENT: Normal NECK: No JVD; No carotid bruits LYMPHATICS: No lymphadenopathy CARDIAC: RRR, no murmurs, rubs, gallops RESPIRATORY:  Clear to auscultation without rales, wheezing or rhonchi  ABDOMEN: Soft, non-tender, non-distended MUSCULOSKELETAL:  No edema; No deformity  SKIN: Warm and dry NEUROLOGIC:  Alert and oriented x 3 PSYCHIATRIC:  Normal affect   ASSESSMENT:    No diagnosis found. PLAN:    In order of problems listed above:   1.  Coronary artery disease: David Conner presents today for work in visit for continued episodes of chest discomfort.  He is status post stenting of his left anterior descending artery.  He still has significant disease including a chronic total occlusion of his first diagonal artery, significant disease in his neck and obtuse marginal artery, and in his distal right coronary artery/posterior descending artery. He seems to be doing ok on medical therapy Has lots of anxiety related to his CAD Cannot tell when he is having mild angina vs.  A normal ache Will have him return or a treadmill next week - on medical therapy including metoprolol and imdur  Will see him in several months  Needs aggressive lipid lowering   2.  Hyperlipidemia: Needs aggressive lipid lowering. Will DC atorva and start Rosuvastatin 40 mg a day  Check lipds , liver enz in 2 months     Medication Adjustments/Labs and Tests Ordered: Current medicines are reviewed at length with the patient today.  Concerns regarding medicines are outlined above.  No orders of the defined types were placed in this encounter.  Meds ordered this encounter  Medications  . rosuvastatin (CRESTOR) 40 MG tablet    Sig: Take 1 tablet (40 mg total) by mouth daily.    Dispense:  90 tablet    Refill:  3    Patient Instructions  Medication Instructions:  Your physician has recommended you make the following change in your medication:    STOP Atorvastatin START Rosuvastatin 40 mg once daily  If you need a refill on your cardiac medications before your next appointment, please call your pharmacy.    Lab work: Your physician recommends that you return for lab work in: 3 months on the day of or a few days before your office visit with Dr. Acie Fredrickson.  You will need to FAST for this appointment - nothing to eat or drink after midnight the night before except water.    Testing/Procedures: Your physician has requested that you have an exercise tolerance test. For further information please visit [email or web address]. Please also follow instruction sheet, as given.    Follow-Up: At [facility], you and your health needs are our priority.  As part of our continuing mission to provide you with exceptional heart care, we have created designated Provider Care Teams.  These Care Teams include your primary Cardiologist (physician) and Advanced Practice Providers (APPs -  Physician Assistants and Nurse Practitioners) who all work together to provide you with the care you need, when you need it. You will need a follow up appointment in:  3 months.  Please call our office 2 months in advance to schedule this appointment.  You may see David Moores, MD or one of the following Advanced Practice Providers on your designated Care Team: Richardson Dopp, PA-C [place], [place] . Daune Perch, NP      Signed, David Moores, MD  12/01/2017 12:59 PM    [facility]   Addendum:  David Conner has continued to have CP, / angina  We have performed a stress test during which she walked on a standard Bruce protocol for 9 minutes.  There were no ST changes.  He did not have any angina. I tried to reassure him that things appear to be stable but he continues to have lots  of angina with any amount of exercise.  We have added him to the catheterization schedule for November 26 at 7:30 AM. We discussed  risks, benefits,  options.  He understands and agrees to proceed.    David Moores, MD  12/11/2017 9:51 AM    [address],  [place], [place]  [address] Pager [phone] Phone: [phone]; Fax: [phone]

## 2017-12-04 ENCOUNTER — Other Ambulatory Visit: Payer: Self-pay | Admitting: Cardiovascular Disease

## 2017-12-04 ENCOUNTER — Ambulatory Visit (INDEPENDENT_AMBULATORY_CARE_PROVIDER_SITE_OTHER): Payer: 59

## 2017-12-04 DIAGNOSIS — R079 Chest pain, unspecified: Secondary | ICD-10-CM | POA: Diagnosis not present

## 2017-12-04 DIAGNOSIS — I25110 Atherosclerotic heart disease of native coronary artery with unstable angina pectoris: Secondary | ICD-10-CM | POA: Diagnosis not present

## 2017-12-04 DIAGNOSIS — I214 Non-ST elevation (NSTEMI) myocardial infarction: Secondary | ICD-10-CM

## 2017-12-04 LAB — EXERCISE TOLERANCE TEST
Estimated workload: 10.1 METS
Exercise duration (min): 9 min
Exercise duration (sec): 0 s
MPHR: 163 {beats}/min
Peak HR: 146 {beats}/min
Percent HR: 90 %
RPE: 16
Rest HR: 81 {beats}/min

## 2017-12-05 ENCOUNTER — Telehealth: Payer: Self-pay | Admitting: Cardiovascular Disease

## 2017-12-05 ENCOUNTER — Other Ambulatory Visit: Payer: 59

## 2017-12-05 DIAGNOSIS — R0602 Shortness of breath: Secondary | ICD-10-CM

## 2017-12-05 DIAGNOSIS — I25110 Atherosclerotic heart disease of native coronary artery with unstable angina pectoris: Secondary | ICD-10-CM

## 2017-12-05 NOTE — Telephone Encounter (Signed)
Mr. Cafaro complains of SOB that started last night around 1900-2000. He states he spoke with Dr. Acie Fredrickson yesterday afternoon and was given the results of his GXT. He was instructed to take Toprol 100mg  BID. He took his second dose of Toprol 100 mg shortly after speaking with Dr. Acie Fredrickson and symptoms started shortly after that.  He is concerned either the extra dose of Toprol is the cause of his SOB or the fact he had a stress test is the cause.  He is not in acute distress, but states he just feels it's hard to catch his breath at times. He says it is worst at rest.  He reports no other symptoms.  This AM, his BP was 126/84. HR 72. The patient is taking all medications as instructed (including Brilinta).  He understands he will be called with Dr. Elmarie Shiley recommendations.

## 2017-12-05 NOTE — Telephone Encounter (Signed)
   It may take him a while to get used to the new meds and new doses that we are prescribing Please continue with the prescribed meds Remind him to take the Brilinta with a drink containing caffeine.

## 2017-12-05 NOTE — Telephone Encounter (Signed)
New Message   Pt c/o medication issue:  1. Name of Medication: metoprolol  2. How are you currently taking this medication (dosage and times per day)?   3. Are you having a reaction (difficulty breathing--STAT)? Shortness of breath  4. What is your medication issue? Patient states that his metoprolol was increased last night and since he has been experiencing shortness of breath

## 2017-12-05 NOTE — Telephone Encounter (Signed)
Spoke with the patient about Dr. Elmarie Shiley recommendations. He accepted and advised if the symptoms worsen to call the office back.

## 2017-12-06 MED ORDER — METOPROLOL SUCCINATE ER 100 MG PO TB24: 100.0000 mg | ORAL_TABLET | Freq: Two times a day (BID) | ORAL | 3 refills | Status: DC

## 2017-12-06 MED FILL — METOPROLOL SUCCINATE ER 100: 100 | 30 days supply | Qty: 60 | Fill #0

## 2017-12-06 NOTE — Addendum Note (Signed)
Addended by: Emmaline Life on: 12/06/2017 09:07 AM   Modules accepted: Orders

## 2017-12-06 NOTE — Telephone Encounter (Signed)
Spoke with patient who states he is feeling better this morning after taking his Brilinta with caffeine last night and this morning. I advised that Dr. Acie Fredrickson would like to order an echo to evaluate SOB. Patient is scheduled for echo on Friday 11/22. He thanked me for the call.  After talking with Dr. Acie Fredrickson, I will send patient a message through MyChart that if SOB is completely resolved by taking the Brilinta with caffeine that we can cancel the echo.

## 2017-12-07 ENCOUNTER — Other Ambulatory Visit: Payer: Self-pay

## 2017-12-07 ENCOUNTER — Ambulatory Visit (HOSPITAL_COMMUNITY): Payer: 59 | Attending: Cardiovascular Disease

## 2017-12-07 DIAGNOSIS — R0602 Shortness of breath: Secondary | ICD-10-CM

## 2017-12-07 DIAGNOSIS — I25110 Atherosclerotic heart disease of native coronary artery with unstable angina pectoris: Secondary | ICD-10-CM | POA: Diagnosis not present

## 2017-12-08 ENCOUNTER — Other Ambulatory Visit (HOSPITAL_COMMUNITY): Payer: 59

## 2017-12-08 MED FILL — ZOLPIDEM TARTRATE 10 MG TAB: 10 | 30 days supply | Qty: 30 | Fill #2

## 2017-12-11 ENCOUNTER — Telehealth: Payer: Self-pay | Admitting: Cardiovascular Disease

## 2017-12-11 ENCOUNTER — Telehealth: Payer: Self-pay | Admitting: *Deleted

## 2017-12-11 ENCOUNTER — Other Ambulatory Visit: Payer: 59 | Admitting: *Deleted

## 2017-12-11 DIAGNOSIS — I25110 Atherosclerotic heart disease of native coronary artery with unstable angina pectoris: Secondary | ICD-10-CM

## 2017-12-11 DIAGNOSIS — Z01812 Encounter for preprocedural laboratory examination: Secondary | ICD-10-CM | POA: Diagnosis not present

## 2017-12-11 LAB — BASIC METABOLIC PANEL
BUN/Creatinine Ratio: 9 (ref 9–20)
BUN: 9 mg/dL (ref 6–24)
CO2: 26 mmol/L (ref 20–29)
Calcium: 9.4 mg/dL (ref 8.7–10.2)
Chloride: 102 mmol/L (ref 96–106)
Creatinine, Ser: 0.96 mg/dL (ref 0.76–1.27)
GFR calc Af Amer: 101 mL/min/{1.73_m2} (ref >59)
GFR calc non Af Amer: 87 mL/min/{1.73_m2} (ref >59)
Glucose: 113 mg/dL — ABNORMAL HIGH (ref 65–99)
Potassium: 4.6 mmol/L (ref 3.5–5.2)
Sodium: 137 mmol/L (ref 134–144)

## 2017-12-11 LAB — CBC
Hematocrit: 45.7 % (ref 37.5–51.0)
Hemoglobin: 15.5 g/dL (ref 13.0–17.7)
MCH: 31.1 pg (ref 26.6–33.0)
MCHC: 33.9 g/dL (ref 31.5–35.7)
MCV: 92 fL (ref 79–97)
Platelets: 251 10*3/uL (ref 150–450)
RBC: 4.99 x10E6/uL (ref 4.14–5.80)
RDW: 13.7 % (ref 12.3–15.4)
WBC: 8.4 10*3/uL (ref 3.4–10.8)

## 2017-12-11 MED FILL — ALPRAZolam 0.5 MG TABS: 0.5 | 15 days supply | Qty: 60 | Fill #0

## 2017-12-11 MED FILL — BRILINTA 90 MG TABLET: 90 | 30 days supply | Qty: 60 | Fill #0

## 2017-12-11 NOTE — Telephone Encounter (Signed)
Pt contacted pre-catheterization scheduled at [facility] for: Tuesday December 12, 2017 7:30 AM Verified arrival time and place: [place] Entrance A at: 5:30 AM  No solid food after midnight prior to cath, clear liquids until 5 AM day of procedure. Verified no diabetes medications.  AM meds can be  taken pre-cath with sip of water including: ASA 81 mg Brilinta 90 mg  Confirmed patient has responsible person to drive home post procedure and for 24 hours after you arrive home: yes  Pt is going to come to the [facility] for pre-procedure BMP/CBC this morning, will order to be done STAT.

## 2017-12-11 NOTE — Telephone Encounter (Signed)
David Conner has continued to have episodes of chest discomfort.  He has known moderate to severe disease involving his small distal branches. He did fairly well on his treadmill test last week.  He was able to walk for 9 minutes without any signs or symptoms of ischemia.  Because his symptoms seem to be gradually worsening, we will schedule him for heart catheterization.  We have discussed the risks, benefits, options of heart catheterization.  He understands and agrees to proceed.    Mertie Moores, MD  12/11/2017 5:15 PM    [address],  [place], [place]  [address] Pager [phone] Phone: [phone]; Fax: [phone]

## 2017-12-12 ENCOUNTER — Ambulatory Visit (HOSPITAL_COMMUNITY)
Admission: RE | Admit: 2017-12-12 | Discharge: 2017-12-12 | Disposition: A | Discharge status: Home / Self Care | Payer: 59 | Source: Ambulatory Visit | Attending: Cardiology | Admitting: Cardiology

## 2017-12-12 ENCOUNTER — Other Ambulatory Visit: Payer: 59

## 2017-12-12 ENCOUNTER — Encounter (HOSPITAL_COMMUNITY)
Admission: RE | Disposition: A | Discharge status: Home / Self Care | Payer: Self-pay | Source: Ambulatory Visit | Attending: Cardiology

## 2017-12-12 ENCOUNTER — Other Ambulatory Visit: Payer: Self-pay

## 2017-12-12 DIAGNOSIS — Z8249 Family history of ischemic heart disease and other diseases of the circulatory system: Secondary | ICD-10-CM | POA: Insufficient documentation

## 2017-12-12 DIAGNOSIS — Z7982 Long term (current) use of aspirin: Secondary | ICD-10-CM | POA: Insufficient documentation

## 2017-12-12 DIAGNOSIS — Z79899 Other long term (current) drug therapy: Secondary | ICD-10-CM | POA: Insufficient documentation

## 2017-12-12 DIAGNOSIS — Z880 Allergy status to penicillin: Secondary | ICD-10-CM | POA: Insufficient documentation

## 2017-12-12 DIAGNOSIS — Z955 Presence of coronary angioplasty implant and graft: Secondary | ICD-10-CM

## 2017-12-12 DIAGNOSIS — I209 Angina pectoris, unspecified: Secondary | ICD-10-CM | POA: Diagnosis present

## 2017-12-12 DIAGNOSIS — Z881 Allergy status to other antibiotic agents status: Secondary | ICD-10-CM | POA: Diagnosis not present

## 2017-12-12 DIAGNOSIS — E785 Hyperlipidemia, unspecified: Secondary | ICD-10-CM | POA: Diagnosis present

## 2017-12-12 DIAGNOSIS — K219 Gastro-esophageal reflux disease without esophagitis: Secondary | ICD-10-CM | POA: Diagnosis not present

## 2017-12-12 DIAGNOSIS — I252 Old myocardial infarction: Secondary | ICD-10-CM | POA: Insufficient documentation

## 2017-12-12 DIAGNOSIS — G2581 Restless legs syndrome: Secondary | ICD-10-CM | POA: Insufficient documentation

## 2017-12-12 DIAGNOSIS — I2510 Atherosclerotic heart disease of native coronary artery without angina pectoris: Secondary | ICD-10-CM

## 2017-12-12 DIAGNOSIS — Z9861 Coronary angioplasty status: Secondary | ICD-10-CM

## 2017-12-12 DIAGNOSIS — I25119 Atherosclerotic heart disease of native coronary artery with unspecified angina pectoris: Secondary | ICD-10-CM | POA: Insufficient documentation

## 2017-12-12 DIAGNOSIS — I10 Essential (primary) hypertension: Secondary | ICD-10-CM | POA: Diagnosis present

## 2017-12-12 HISTORY — PX: LEFT HEART CATH AND CORONARY ANGIOGRAPHY: CATH118249

## 2017-12-12 LAB — POCT ACTIVATED CLOTTING TIME: Activated Clotting Time: 340 seconds

## 2017-12-12 SURGERY — LEFT HEART CATH AND CORONARY ANGIOGRAPHY
Anesthesia: LOCAL

## 2017-12-12 MED ORDER — NITROGLYCERIN 1 MG/10 ML FOR IR/CATH LAB
INTRA_ARTERIAL | Status: AC
  Filled 2017-12-12: qty 10

## 2017-12-12 MED ORDER — SODIUM CHLORIDE 0.9 % IV SOLN
INTRAVENOUS | Status: AC | PRN
  Administered 2017-12-12: 250 mL via INTRAVENOUS

## 2017-12-12 MED ORDER — SODIUM CHLORIDE 0.9 % WEIGHT BASED INFUSION
3.0000 mL/kg/h | INTRAVENOUS | Status: AC
  Administered 2017-12-12: 3 mL/kg/h via INTRAVENOUS

## 2017-12-12 MED ORDER — FENTANYL CITRATE (PF) 100 MCG/2ML IJ SOLN
INTRAMUSCULAR | Status: AC
  Filled 2017-12-12: qty 2

## 2017-12-12 MED ORDER — ASPIRIN 81 MG PO CHEW: 81.0000 mg | CHEWABLE_TABLET | ORAL | Status: DC

## 2017-12-12 MED ORDER — HEPARIN (PORCINE) IN NACL 1000-0.9 UT/500ML-% IV SOLN
INTRAVENOUS | Status: AC
  Filled 2017-12-12: qty 500

## 2017-12-12 MED ORDER — HEPARIN (PORCINE) IN NACL 1000-0.9 UT/500ML-% IV SOLN
INTRAVENOUS | Status: DC | PRN
  Administered 2017-12-12 (×2): 500 mL

## 2017-12-12 MED ORDER — SODIUM CHLORIDE 0.9% FLUSH: 3.0000 mL | INTRAVENOUS | Status: DC | PRN

## 2017-12-12 MED ORDER — SODIUM CHLORIDE 0.9 % IV SOLN: 250.0000 mL | INTRAVENOUS | Status: DC | PRN

## 2017-12-12 MED ORDER — HEPARIN SODIUM (PORCINE) 1000 UNIT/ML IJ SOLN
INTRAMUSCULAR | Status: DC | PRN
  Administered 2017-12-12 (×2): 5000 [IU] via INTRAVENOUS

## 2017-12-12 MED ORDER — IOHEXOL 350 MG/ML SOLN
INTRAVENOUS | Status: DC | PRN
  Administered 2017-12-12: 170 mL via INTRAVENOUS

## 2017-12-12 MED ORDER — ACETAMINOPHEN 325 MG PO TABS: 650.0000 mg | ORAL_TABLET | ORAL | Status: DC | PRN

## 2017-12-12 MED ORDER — VERAPAMIL HCL 2.5 MG/ML IV SOLN
INTRAVENOUS | Status: AC
  Filled 2017-12-12: qty 2

## 2017-12-12 MED ORDER — SODIUM CHLORIDE 0.9 % WEIGHT BASED INFUSION: 1.0000 mL/kg/h | INTRAVENOUS | Status: DC

## 2017-12-12 MED ORDER — LIDOCAINE HCL (PF) 1 % IJ SOLN
INTRAMUSCULAR | Status: AC
  Filled 2017-12-12: qty 30

## 2017-12-12 MED ORDER — ONDANSETRON HCL 4 MG/2ML IJ SOLN: 4.0000 mg | Freq: Four times a day (QID) | INTRAMUSCULAR | Status: DC | PRN

## 2017-12-12 MED ORDER — SODIUM CHLORIDE 0.9% FLUSH: 3.0000 mL | Freq: Two times a day (BID) | INTRAVENOUS | Status: DC

## 2017-12-12 MED ORDER — FENTANYL CITRATE (PF) 100 MCG/2ML IJ SOLN
INTRAMUSCULAR | Status: DC | PRN
  Administered 2017-12-12 (×2): 25 ug via INTRAVENOUS

## 2017-12-12 MED ORDER — HEPARIN SODIUM (PORCINE) 1000 UNIT/ML IJ SOLN
INTRAMUSCULAR | Status: AC
  Filled 2017-12-12: qty 1

## 2017-12-12 MED ORDER — MIDAZOLAM HCL 2 MG/2ML IJ SOLN
INTRAMUSCULAR | Status: DC | PRN
  Administered 2017-12-12 (×2): 1 mg via INTRAVENOUS

## 2017-12-12 MED ORDER — VERAPAMIL HCL 2.5 MG/ML IV SOLN
INTRAVENOUS | Status: DC | PRN
  Administered 2017-12-12: 10 mL via INTRA_ARTERIAL

## 2017-12-12 MED ORDER — LIDOCAINE HCL (PF) 1 % IJ SOLN
INTRAMUSCULAR | Status: DC | PRN
  Administered 2017-12-12: 2 mL via INTRADERMAL

## 2017-12-12 MED ORDER — MIDAZOLAM HCL 2 MG/2ML IJ SOLN
INTRAMUSCULAR | Status: AC
  Filled 2017-12-12: qty 2

## 2017-12-12 SURGICAL SUPPLY — 17 items
BALLN SAPPHIRE 2.5X15 (BALLOONS) ×2
BALLN SAPPHIRE ~~LOC~~ 2.75X18 (BALLOONS) ×1 IMPLANT
BALLOON SAPPHIRE 2.5X15 (BALLOONS) IMPLANT
CATH 5FR JL3.5 JR4 ANG PIG MP (CATHETERS) ×1 IMPLANT
CATH LAUNCHER 6FR EBU3.5 (CATHETERS) ×1 IMPLANT
DEVICE RAD COMP TR BAND LRG (VASCULAR PRODUCTS) ×1 IMPLANT
GLIDESHEATH SLEND SS 6F .021 (SHEATH) ×1 IMPLANT
GUIDEWIRE INQWIRE 1.5J.035X260 (WIRE) IMPLANT
INQWIRE 1.5J .035X260CM (WIRE) ×2
KIT ENCORE 26 ADVANTAGE (KITS) ×1 IMPLANT
KIT HEART LEFT (KITS) ×2 IMPLANT
PACK CARDIAC CATHETERIZATION (CUSTOM PROCEDURE TRAY) ×2 IMPLANT
STENT SYNERGY DES 2.5X20 (Permanent Stent) ×1 IMPLANT
STENT SYNERGY DES 2.5X28 (Permanent Stent) ×1 IMPLANT
TRANSDUCER W/STOPCOCK (MISCELLANEOUS) ×2 IMPLANT
TUBING CIL FLEX 10 FLL-RA (TUBING) ×2 IMPLANT
WIRE MINAMO 190 (WIRE) ×1 IMPLANT

## 2017-12-12 NOTE — Discharge Summary (Signed)
Discharge Summary    Patient ID: David Conner MRN: [id number]; DOB: 1961-01-12  Admit date: 12/12/2017 Discharge date: 12/12/2017  Primary Care Provider: Orpah Melter, MD  Primary Cardiologist: Mertie Moores, MD   Discharge Diagnoses    Principal Problem:   Angina pectoris [facility]) Active Problems:   Hyperlipemia   CAD S/P percutaneous coronary angioplasty   Essential (primary) hypertension   Allergies Allergies  Allergen Reactions  . Lansoprazole Other (See Comments)    Dizziness   . Ofloxacin Other (See Comments)    insomnia  . Penicillin G Hives and Other (See Comments)    Childhood allergic reaction Has patient had a PCN reaction causing immediate rash, facial/tongue/throat swelling, SOB or lightheadedness with hypotension: Yes Has patient had a PCN reaction causing severe rash involving mucus membranes or skin necrosis: No Has patient had a PCN reaction that required hospitalization: No Has patient had a PCN reaction occurring within the last 10 years: No If all of the above answers are "NO", then may proceed with Cephalosporin use.  . Versed [Midazolam] Anxiety    Pt reports that he had anxiety 24hr post procedure. Versed was given during previous catherization without adverse affects.     Diagnostic Studies/Procedures    LEFT HEART CATH AND CORONARY ANGIOGRAPHY  Conclusion     Acute Mrg-1 lesion is 85% stenosed.  Acute Mrg-2 lesion is 90% stenosed.  Prox RCA to Mid RCA lesion is 40% stenosed.  Previously placed Prox LAD to Mid LAD stent (unknown type) is widely patent.  Mid LAD lesion is 70% stenosed.  Dist LAD lesion is 99% stenosed.  Ost 1st Diag to 1st Diag lesion is 100% stenosed.  1st Mrg lesion is 75% stenosed.  2nd Mrg lesion is 75% stenosed.  Post intervention, there is a 0% residual stenosis.  A drug-eluting stent was successfully placed using a STENT SYNERGY DES 2.5X28.  Post intervention, there is a 0% residual  stenosis.  A drug-eluting stent was successfully placed using a STENT SYNERGY DES 2.5X20.  LV end diastolic pressure is normal.   1. 3 vessel obstructive CAD.    - the stent in the mid LAD is patent. The mid to distal LAD is severely and diffusely diseased all the way to the apex    - 100% CTO of the first diagonal with collaterals.    - 75% OM1    - 75% OM2    - 90% RV marginal branch 2. Normal LVEDP 3. Successful PCI of the first OM with DES x 1 4. Successful PCI of the second OM with DES x 1.  Plan: continue aggressive risk factor modification and medical management. Residual disease in the LAD, diagonal, and RCA are not amenable to PCI and also are not suitable targets for CABG.   Recommend uninterrupted dual antiplatelet therapy with Aspirin 81mg  daily and Ticagrelor 90mg  twice daily for a minimum of 12 months (ACS - Class I recommendation).     History of Present Illness     David Conner is a 57 y.o. male with a past medical history significant for hyperlipidemia, ED, GERD, RLS and recent dx of CAD presented for cath.   Cath 11/13/17 showed showed multivessel CAD including 99%,mid LAD and chronic total occlusion of distal LAD and D1, moderateto severe OM diseaseand mild to moderate, diffuse disease involving non-dominant RCA. A successful PCI to mid [place] completed without complication.Occlusion of D1 could not be crossed with BMW wire, consistent with CTO.Recommendations were made for aggressive secondary  prevention and continued medical therapy of diagonal and LCX/OM disease.  He continued to have going pain pain when seen by APP. Started on Imdur 30mg  and Increase metoprolol to 100mg  daily. Due to going symptoms patient had repeat echo which showed normal LVEF. Exercise myoview without evidence of provokable ischemia.  He was able to walk for 9 minutes without any signs or symptoms of ischemia. However, recommended cath for ongoing chest pain.   Hospital Course      Consultants: None  Cath showed patent stent.  Details as above.  100% CT of diagonal with collaterals.  3.1, creatinine 1.0 75% OM1 and 75% OM 2.  She underwent successful PCI of first OM with DES x1 and second OM with DES x1. No  Immediate complications.  Tolerated procedure well.  Deemed a good candidate for same-day PCI. No hematoma.    Discharge Vitals Blood pressure 103/72, pulse 76, temperature 98.3 F (36.8 C), temperature source Oral, resp. rate 15, height 5' 10.5" (1.791 m), weight 93 kg, SpO2 98 %.  Filed Weights   12/12/17 0603  Weight: 93 kg   Physical Exam  Constitutional: He is oriented to person, place, and time and well-developed, well-nourished, and in no distress.  HENT:  Head: Normocephalic and atraumatic.  Eyes: Pupils are equal, round, and reactive to light.  Neck: Normal range of motion. Neck supple.  Cardiovascular: Normal rate and regular rhythm.  Right radial cath site without hematoma   Pulmonary/Chest: Effort normal and breath sounds normal.  Abdominal: Soft. Bowel sounds are normal.  Musculoskeletal: Normal range of motion.  Neurological: He is alert and oriented to person, place, and time.  Skin: Skin is warm and dry.  Psychiatric: Affect normal.   Labs & Radiologic Studies    CBC Recent Labs    12/11/17 1154  WBC 8.4  HGB 15.5  HCT 45.7  MCV 92  PLT 751   Basic Metabolic Panel Recent Labs    12/11/17 1154  NA 137  K 4.6  CL 102  CO2 26  GLUCOSE 113*  BUN 9  CREATININE 0.96  CALCIUM 9.4    Disposition   Pt is being discharged home today in good condition.  Follow-up Plans & Appointments    Follow-up Information    Daune Perch, NP. Go on 12/25/2017.   Specialty:  Nurse Practitioner Why:  @noon  for cath follow up  Contact information: [place] [phone]          Discharge Instructions    Amb Referral to Cardiac Rehabilitation   Complete by:  As directed     Diagnosis:  Coronary Stents   Diet - low sodium heart healthy   Complete by:  As directed    Discharge instructions   Complete by:  As directed    No driving for 48 hours. No lifting over 5 lbs for 1 week. No sexual activity for 1 week. You may return to work on 12/18/17. Keep procedure site clean & dry. If you notice increased pain, swelling, bleeding or pus, call/return!  You may shower, but no soaking baths/hot tubs/pools for 1 week.   Increase activity slowly   Complete by:  As directed       Discharge Medications   Allergies as of 12/12/2017      Reactions   Lansoprazole Other (See Comments)   Dizziness   Ofloxacin Other (See Comments)   insomnia   Penicillin G Hives, Other (See Comments)  Childhood allergic reaction Has patient had a PCN reaction causing immediate rash, facial/tongue/throat swelling, SOB or lightheadedness with hypotension: Yes Has patient had a PCN reaction causing severe rash involving mucus membranes or skin necrosis: No Has patient had a PCN reaction that required hospitalization: No Has patient had a PCN reaction occurring within the last 10 years: No If all of the above answers are "NO", then may proceed with Cephalosporin use.   Versed [midazolam] Anxiety   Pt reports that he had anxiety 24hr post procedure. Versed was given during previous catherization without adverse affects.       Medication List    TAKE these medications   ALPRAZolam 0.5 MG tablet Commonly known as:  XANAX Take 0.5 mg by mouth 2 (two) times daily as needed for anxiety.   ANDROGEL PUMP 20.25 MG/ACT (1.62%) Gel Generic drug:  Testosterone Place 40.5 mg onto the skin daily. 2 pumps   aspirin EC 81 MG tablet Take 81 mg by mouth daily.   esomeprazole 40 MG capsule Commonly known as:  NEXIUM Take 40 mg by mouth daily.   isosorbide mononitrate 30 MG 24 hr tablet Commonly known as:  IMDUR Take 1 tablet (30 mg total) by mouth daily.   metoprolol succinate 100 MG 24 hr  tablet Commonly known as:  TOPROL-XL Take 1 tablet (100 mg total) by mouth 2 (two) times daily. Take with or immediately following a meal.   nitroGLYCERIN 0.3 MG SL tablet Commonly known as:  NITROSTAT Place 0.3 mg under the tongue every 5 (five) minutes as needed for chest pain.   rosuvastatin 40 MG tablet Commonly known as:  CRESTOR Take 1 tablet (40 mg total) by mouth daily.   ticagrelor 90 MG Tabs tablet Commonly known as:  BRILINTA Take 1 tablet (90 mg total) by mouth 2 (two) times daily.   zolpidem 10 MG tablet Commonly known as:  AMBIEN Take 10 mg by mouth at bedtime.        Acute coronary syndrome (MI, NSTEMI, STEMI, etc) this admission?: No.    Outstanding Labs/Studies  LFTS and Lipid pane in few weeks as schedule   Duration of Discharge Encounter   Greater than 30 minutes including physician time.  Mahalia Longest [place], PA 12/12/2017, 2:18 PM

## 2017-12-12 NOTE — Discharge Instructions (Signed)

## 2017-12-12 NOTE — Interval H&P Note (Signed)
History and Physical Interval Note:  12/12/2017 7:23 AM  David Katherine Conner  has presented today for surgery, with the diagnosis of cp  The various methods of treatment have been discussed with the patient and family. After consideration of risks, benefits and other options for treatment, the patient has consented to  Procedure(s): LEFT HEART CATH AND CORONARY ANGIOGRAPHY (N/A) as a surgical intervention .  The patient's history has been reviewed, patient examined, no change in status, stable for surgery.  I have reviewed the patient's chart and labs.  Questions were answered to the patient's satisfaction.   Cath Lab Visit (complete for each Cath Lab visit)  Clinical Evaluation Leading to the Procedure:   ACS: No.  Non-ACS:    Anginal Classification: CCS II  Anti-ischemic medical therapy: Maximal Therapy (2 or more classes of medications)  Non-Invasive Test Results: Low-risk stress test findings: cardiac mortality <1%/year  Prior CABG: No previous CABG        Gibran Veselka Martinique MD,FACC 12/12/2017 7:23 AM

## 2017-12-12 NOTE — Progress Notes (Signed)
CARDIAC REHAB PHASE I   Reviewed stent ed with pt and wife. Encouraged continued use of ASA, Brilinta, and NTG. Reviewed restrictions and exercise guidelines. Pt having a hard time coping with previous MI and stent placement. Noted increased fears and anxiety. Provided encouragement to pt, and urged him to give himself some time to heal. Pt referred to [facility] [place].  [phone] Rufina Falco, RN BSN 12/12/2017 12:01 PM

## 2017-12-13 ENCOUNTER — Encounter (HOSPITAL_COMMUNITY): Payer: Self-pay | Admitting: Cardiology

## 2017-12-15 ENCOUNTER — Encounter (HOSPITAL_COMMUNITY): Payer: Self-pay | Admitting: Cardiology

## 2017-12-18 MED FILL — ISOSORBIDE MN ER 30 MG TAB: 30 | 30 days supply | Qty: 30 | Fill #1

## 2017-12-25 ENCOUNTER — Telehealth (HOSPITAL_COMMUNITY): Payer: Self-pay

## 2017-12-25 ENCOUNTER — Encounter: Payer: Self-pay | Admitting: Cardiovascular Disease

## 2017-12-25 ENCOUNTER — Ambulatory Visit: Payer: 59 | Admitting: Cardiovascular Disease

## 2017-12-25 ENCOUNTER — Ambulatory Visit: Payer: 59 | Admitting: Cardiology

## 2017-12-25 VITALS — BP 110/72 | HR 68 | Ht 70.5 in | Wt 207.0 lb

## 2017-12-25 DIAGNOSIS — E782 Mixed hyperlipidemia: Secondary | ICD-10-CM

## 2017-12-25 DIAGNOSIS — I2510 Atherosclerotic heart disease of native coronary artery without angina pectoris: Secondary | ICD-10-CM

## 2017-12-25 MED ORDER — RANOLAZINE ER 500 MG PO TB12: 500.0000 mg | ORAL_TABLET | Freq: Two times a day (BID) | ORAL | 11 refills | Status: DC

## 2017-12-25 MED FILL — POTASSIUM CHLORIDE CRYS ER: 20 | 30 days supply | Qty: 30 | Fill #1

## 2017-12-25 MED FILL — RANOLAZINE ER 500 MG TABLET: 500 | 30 days supply | Qty: 60 | Fill #0

## 2017-12-25 MED FILL — ROSUVASTATIN CALCIUM 40 MG: 40 | 30 days supply | Qty: 30 | Fill #1

## 2017-12-25 MED FILL — HYDROCHLOROTHIAZIDE 25 MG T: 25 | 30 days supply | Qty: 30 | Fill #1

## 2017-12-25 NOTE — Patient Instructions (Addendum)
Medication Instructions:  Your physician has recommended you make the following change in your medication:   START Ranexa (Ranolazine) 500 mg twice daily  If you need a refill on your cardiac medications before your next appointment, please call your pharmacy.    Lab work: Your physician recommends that you return for lab work in: 1 months on the day of or a few days before your office visit with Dr. Acie Fredrickson.  You will need to FAST for this appointment - nothing to eat or drink after midnight the night before except water.    Testing/Procedures: None Ordered    Follow-Up: At [facility], you and your health needs are our priority.  As part of our continuing mission to provide you with exceptional heart care, we have created designated Provider Care Teams.  These Care Teams include your primary Cardiologist (physician) and Advanced Practice Providers (APPs -  Physician Assistants and Nurse Practitioners) who all work together to provide you with the care you need, when you need it. You will need a follow up appointment in:  1 months.   Thursday January 16 at 10:00 am

## 2017-12-25 NOTE — Progress Notes (Signed)
Cardiology Office Note:    Date:  12/25/2017   ID:  [name], DOB 1960-10-27, MRN [id number]  PCP:  Orpah Melter, MD  Cardiologist:  Mertie Moores, MD  Electrophysiologist:  None   Referring MD: Orpah Melter, MD   Chief Complaint  Patient presents with  . Coronary Artery Disease    Nov. 15, 2019   David Conner is a 57 y.o. male with a recent diagnosis of coronary artery disease.  He was admitted to the hospital in October.  He was found to have a tight LAD stenosis which was stented.  He has a chronic total occlusion of a moderate-sized diagonal vessel.  He also has moderate to severe disease in his distal right coronary artery and obtuse marginal artery.  David Conner to have episodes of chest discomfort.  He saw David Ades, NP week and his  Toprol-XL was  doubled and Imdur 30  was added . Also added Xanax   Has not had any syncope or presyncope  He seems to have fewer chest pains since the increase in meds  Also admits that he has not done as much   Dec. 9, 2019:  David Conner seen today for follow-up visit.  He was admitted to the hospital with symptoms of unstable angina.  He had PCI of his obtuse marginal arteries.  He still has severe diffuse disease involving his distal  left anterior descending artery. His D1 is 100%  occluded.   His angina is clearly better.   Has been taking it easy  Still has some chest pressure when he over did it last week  Is walking on the treadmill -  No angina when walking on the treadmill    Past Medical History:  Diagnosis Date  . Allergy   . Complication of anesthesia    upper gi done, had anxiety with versed  . Coronary artery disease    11/13/17:PCI to mid LAD using Resolute Onyx 3.0 x 18 mm DES  . Depression    slight, on sertraline  . ED (erectile dysfunction)   . GERD (gastroesophageal reflux disease)   . Hyperlipidemia   . Nausea occasional  . Restless legs syndrome     Past Surgical History:  Procedure  Laterality Date  . CHOLECYSTECTOMY N/A 02/18/2013   Procedure: LAPAROSCOPIC CHOLECYSTECTOMY WITH INTRAOPERATIVE CHOLANGIOGRAM, DIAGNOSTIC LAPAROSCOPY, LYSIS OF ADHESIONS ;  Surgeon: [name], MD;  Location: [facility];  Service: General;  Laterality: N/A;  . colonscopy  2 years ago  . CORONARY STENT INTERVENTION  11/13/2017  . CORONARY STENT INTERVENTION N/A 11/13/2017   Procedure: CORONARY STENT INTERVENTION;  Surgeon: Nelva Bush, MD;  Location: [place] [facility];  Service: Cardiovascular;  Laterality: N/A;  . cortisone injection Left 3 weeks ago   dr thinks is bursitis  . esophagus stretching     7-8 times  . LAPAROSCOPIC APPENDECTOMY N/A 02/18/2013   Procedure: APPENDECTOMY LAPAROSCOPIC;  Surgeon: [name], MD;  Location: [facility];  Service: General;  Laterality: N/A;  DR Redmond Pulling SPOKE WITH PT FAMILY DURING SURGERY ABOUT REMOVING PT APPENDIX DUE TO MALROTATION. FAMILY STATED TO PROCEDE WITH REMOVAL OF APPENDIX.   David Conner LEFT HEART CATH AND CORONARY ANGIOGRAPHY N/A 11/13/2017   Procedure: LEFT HEART CATH AND CORONARY ANGIOGRAPHY;  Surgeon: Nelva Bush, MD;  Location: [place] [facility];  Service: Cardiovascular;  Laterality: N/A;  . LEFT HEART CATH AND CORONARY ANGIOGRAPHY N/A 12/12/2017   Procedure: LEFT HEART CATH AND CORONARY ANGIOGRAPHY;  Surgeon: Martinique,  Ander Slade, MD;  Location: [place] [facility];  Service: Cardiovascular;  Laterality: N/A;    Current Medications: Current Meds  Medication Sig  . ALPRAZolam (XANAX) 0.5 MG tablet Take 0.5 mg by mouth 2 (two) times daily as needed for anxiety.  David Conner aspirin EC 81 MG tablet Take 81 mg by mouth daily.   David Conner esomeprazole (NEXIUM) 40 MG capsule Take 40 mg by mouth daily.   . isosorbide mononitrate (IMDUR) 30 MG 24 hr tablet Take 1 tablet (30 mg total) by mouth daily.  . metoprolol succinate (TOPROL-XL) 100 MG 24 hr tablet Take 1 tablet (100 mg total) by mouth 2 (two) times daily. Take with or immediately following a meal.  . nitroGLYCERIN  (NITROSTAT) 0.3 MG SL tablet Place 0.3 mg under the tongue every 5 (five) minutes as needed for chest pain.   . rosuvastatin (CRESTOR) 40 MG tablet Take 1 tablet (40 mg total) by mouth daily.  . Testosterone (ANDROGEL PUMP) 20.25 MG/ACT (1.62%) GEL Place 40.5 mg onto the skin daily. 2 pumps  . ticagrelor (BRILINTA) 90 MG TABS tablet Take 1 tablet (90 mg total) by mouth 2 (two) times daily.  David Conner zolpidem (AMBIEN) 10 MG tablet Take 10 mg by mouth at bedtime.     Allergies:   Lansoprazole; Ofloxacin; Penicillin g; and Versed [midazolam]   Social History   Socioeconomic History  . Marital status: Married    Spouse name: Not on file  . Number of children: Not on file  . Years of education: Not on file  . Highest education level: Not on file  Occupational History  . Not on file  Social Needs  . Financial resource strain: Not on file  . Food insecurity:    Worry: Not on file    Inability: Not on file  . Transportation needs:    Medical: Not on file    Non-medical: Not on file  Tobacco Use  . Smoking status: Never Smoker  . Smokeless tobacco: Never Used  Substance and Sexual Activity  . Alcohol use: No  . Drug use: No  . Sexual activity: Yes    Birth control/protection: Sponge  Lifestyle  . Physical activity:    Days per week: Not on file    Minutes per session: Not on file  . Stress: Not on file  Relationships  . Social connections:    Talks on phone: Not on file    Gets together: Not on file    Attends religious service: Not on file    Active member of club or organization: Not on file    Attends meetings of clubs or organizations: Not on file    Relationship status: Not on file  Other Topics Concern  . Not on file  Social History Narrative  . Not on file     Family History: The patient's family history includes Bladder Cancer in his father; CAD in his maternal grandfather and mother; Diabetes in his father; Healthy in his sister; Hyperlipidemia in his mother;  Hypertension in his father and mother.  ROS:      EKGs/Labs/Other Studies Reviewed:       EKG:     Recent Labs: 11/12/2017: ALT 22 12/11/2017: BUN 9; Creatinine, Ser 0.96; Hemoglobin 15.5; Platelets 251; Potassium 4.6; Sodium 137  Recent Lipid Panel    Component Value Date/Time   CHOL 191 11/11/2017 2043   TRIG 38 11/11/2017 2043   HDL 41 11/11/2017 2043   CHOLHDL 4.7 11/11/2017 2043   VLDL  8 11/11/2017 2043   LDLCALC 142 (H) 11/11/2017 2043    Physical Exam:    Physical Exam: Blood pressure 110/72, pulse 68, height 5' 10.5" (1.791 m), weight 207 lb (93.9 kg), SpO2 96 %.  GEN:   Middle age male,  Anxious but in NAD  HEENT: Normal NECK: No JVD; No carotid bruits LYMPHATICS: No lymphadenopathy CARDIAC: RRR , no murmurs, rubs, gallops RESPIRATORY:  Clear to auscultation without rales, wheezing or rhonchi  ABDOMEN: Soft, non-tender, non-distended MUSCULOSKELETAL:  No edema; No deformity  SKIN: Warm and dry NEUROLOGIC:  Alert and oriented x 3   ASSESSMENT:    1. Coronary artery disease involving native coronary artery of native heart without angina pectoris   2. Mixed hyperlipidemia    PLAN:    In order of problems listed above:   1.  Coronary artery disease:  Appears to be doing well No angina at this point but he has been taking it easy Is set to start cardiac rehab.   Will  start Ranexa 500 mg BID -   see him again in 1 to 2 months for an office visit and EKG.  encouraged him take NTG as needed.   Restarting cardiac rehab next month.  I have encouraged him to just exercise very lightly until he starts cardiac rehab.  He is very anxious and scared about the possibility of having another heart attack. Tried to reassure him that were taken all the normal precautions and that overall I think he is doing very well.  2.  Hyperlipidemia: Rosuvastatin 40 mg a day for about a month.  We will recheck labs when I see him again in 1 month.    Medication  Adjustments/Labs and Tests Ordered: Current medicines are reviewed at length with the patient today.  Concerns regarding medicines are outlined above.  Orders Placed This Encounter  Procedures  . Lipid Profile  . Basic Metabolic Panel (BMET)  . Hepatic function panel   Meds ordered this encounter  Medications  . ranolazine (RANEXA) 500 MG 12 hr tablet    Sig: Take 1 tablet (500 mg total) by mouth 2 (two) times daily.    Dispense:  60 tablet    Refill:  11    Patient Instructions  Medication Instructions:  Your physician has recommended you make the following change in your medication:   START Ranexa (Ranolazine) 500 mg twice daily  If you need a refill on your cardiac medications before your next appointment, please call your pharmacy.    Lab work: Your physician recommends that you return for lab work in: 1 months on the day of or a few days before your office visit with Dr. Acie Fredrickson.  You will need to FAST for this appointment - nothing to eat or drink after midnight the night before except water.    Testing/Procedures: None Ordered    Follow-Up: At [facility], you and your health needs are our priority.  As part of our continuing mission to provide you with exceptional heart care, we have created designated Provider Care Teams.  These Care Teams include your primary Cardiologist (physician) and Advanced Practice Providers (APPs -  Physician Assistants and Nurse Practitioners) who all work together to provide you with the care you need, when you need it. You will need a follow up appointment in:  1 months.   Thursday January 16 at 10:00 am

## 2017-12-28 ENCOUNTER — Telehealth: Payer: Self-pay | Admitting: Cardiovascular Disease

## 2017-12-28 DIAGNOSIS — R5383 Other fatigue: Secondary | ICD-10-CM

## 2017-12-28 DIAGNOSIS — I2510 Atherosclerotic heart disease of native coronary artery without angina pectoris: Secondary | ICD-10-CM

## 2017-12-28 NOTE — Telephone Encounter (Signed)
Called patient to review Dr. Elmarie Shiley advice. Upon further discussion, determined that patient's symptoms may be coming from Crestor 40 mg daily. He has generalized fatigue and muscle pain when he does simple tasks.  I discussed with Dr. Acie Fredrickson and he advised patient may hold Crestor for one week and report back on symptoms. Patient agrees to proceed with this plan rather than decreasing Metoprolol at this time. He requests that we do lab work to check his liver function and glucose which I have scheduled for tomorrow. I advised patient to call back next week to let us know if his symptoms are improving. He asks about taking Xanax and I advised that since it can also cause fatigue, that he should try to take it only for severe anxiety. Patient verbalized understanding and agreement with plan and thanked me for the call.

## 2017-12-28 NOTE — Telephone Encounter (Signed)
I think the back discomfort is likely a musculoskelatal pain  Continue current meds He should discuss these various aches and pains with his primary  md

## 2017-12-28 NOTE — Telephone Encounter (Signed)
Spoke with patient who states he has a fatigued feeling across his body, particularly across his back. He states this started before he started Ranexa. States it began one week ago and he does not associate it with increased dose of Toprol because it has been several weeks since he increased that dose. States Xanax helps ease the discomfort and he is less anxious since his ov on 12/9 so he does not think anxiety is related. He denies discomfort with sleeping but does note discomfort with movement. States the feeling is like sitting hunched over a computer too long and he feels it immediately upon standing. He has started the Ranexa and states he feels improvement in angina since starting. He admits to not having a good appetite recently and we discussed the impact of not getting enough nutrients with fatigue. Also has nausea and reports walking is uncomfortable. States also notes breathlessness different from when he started [place]. I answered questions to his satisfaction and advised I will forward to Dr. Acie Fredrickson for advice. He verbalized understanding and agreement and thanked me for the call.

## 2017-12-28 NOTE — Telephone Encounter (Signed)
New Message:      Pt says he having been feeling extremely fatigued. He also said he has a tired feeling in the muscle of his upper back,especialy when he moves around.

## 2017-12-28 NOTE — Telephone Encounter (Signed)
He continues to have fatigue. He is on a high dose of metoprolol Lets try reducing the metoprolol to 50 mg BID and see if his fatigue improves.

## 2017-12-29 ENCOUNTER — Other Ambulatory Visit: Payer: 59 | Admitting: *Deleted

## 2017-12-29 DIAGNOSIS — I2510 Atherosclerotic heart disease of native coronary artery without angina pectoris: Secondary | ICD-10-CM

## 2017-12-29 DIAGNOSIS — R5383 Other fatigue: Secondary | ICD-10-CM | POA: Diagnosis not present

## 2017-12-29 LAB — CBC
Hematocrit: 46.1 % (ref 37.5–51.0)
Hemoglobin: 15.7 g/dL (ref 13.0–17.7)
MCH: 30.9 pg (ref 26.6–33.0)
MCHC: 34.1 g/dL (ref 31.5–35.7)
MCV: 91 fL (ref 79–97)
Platelets: 279 10*3/uL (ref 150–450)
RBC: 5.08 x10E6/uL (ref 4.14–5.80)
RDW: 12.7 % (ref 12.3–15.4)
WBC: 7 10*3/uL (ref 3.4–10.8)

## 2017-12-29 LAB — BASIC METABOLIC PANEL
BUN/Creatinine Ratio: 7 — ABNORMAL LOW (ref 9–20)
BUN: 8 mg/dL (ref 6–24)
CO2: 24 mmol/L (ref 20–29)
Calcium: 9.7 mg/dL (ref 8.7–10.2)
Chloride: 101 mmol/L (ref 96–106)
Creatinine, Ser: 1.08 mg/dL (ref 0.76–1.27)
GFR calc Af Amer: 88 mL/min/{1.73_m2} (ref >59)
GFR calc non Af Amer: 76 mL/min/{1.73_m2} (ref >59)
Glucose: 109 mg/dL — ABNORMAL HIGH (ref 65–99)
Potassium: 4 mmol/L (ref 3.5–5.2)
Sodium: 142 mmol/L (ref 134–144)

## 2017-12-29 LAB — HEPATIC FUNCTION PANEL
ALT: 52 IU/L — ABNORMAL HIGH (ref 0–44)
AST: 37 IU/L (ref 0–40)
Albumin: 4.7 g/dL (ref 3.5–5.5)
Alkaline Phosphatase: 67 IU/L (ref 39–117)
Bilirubin Total: 0.7 mg/dL (ref 0.0–1.2)
Bilirubin, Direct: 0.22 mg/dL (ref 0.00–0.40)
Total Protein: 6.6 g/dL (ref 6.0–8.5)

## 2017-12-29 MED FILL — SERTRALINE HCL 50 MG TABLET: 50 | 30 days supply | Qty: 30 | Fill #0

## 2018-01-01 ENCOUNTER — Telehealth: Payer: Self-pay | Admitting: Cardiovascular Disease

## 2018-01-01 DIAGNOSIS — E782 Mixed hyperlipidemia: Secondary | ICD-10-CM

## 2018-01-01 DIAGNOSIS — Z789 Other specified health status: Secondary | ICD-10-CM

## 2018-01-01 NOTE — Telephone Encounter (Signed)
Called patient to discuss symptoms. He states he feels worse now than prior to 2 cardiac stents and is very fatigued from light activity such as rolling the trash cans up the driveway or just walking around the house. He states at times his chest discomfort is worse while sitting than when he is exerting himself. He has been holding Crestor since 12/12 and states he does not notice much improvement. I advised him to restart Crestor at 40 mg if no change in symptoms after holding Crestor x 1 week. I asked if symptoms worsened at the time he started Ranexa and he denies. His BP is marginally low so I do not think he will tolerate increasing the dose at this time. I advised that per Dr. Acie Fredrickson, he needs to increase his exercise capacity gradually to peak HR of 140 bpm. Patient states he was doing this consistently until the stents and now he cannot hardly complete his daily activities without fatigue. I asked about diet and he admits to decreased appetite and eating very little during the day, for example today he has had 2 bananas and a graham cracker. I advised that some of his fatigue may be linked to lack of sufficient caloric intake. I advised him to increase calories, specifically from intake of healthy protein-rich foods such as grilled or baked chicken or fish. He admits to a complicated hx of GI problems and has an appointment with his GI doctor in mid-January. I asked him to try Ensure or similar products if he is unable to tolerate other foods and to call for sooner GI appointment.  He states he feels better when he takes Xanax but also felt better yesterday after taking a NTG. His PCP recently prescribed Zoloft for anxiety/depression instead of Xanax but he did not have an office visit with PCP, the change was made by telephone. I asked him to check with PCP on side effects and if he thinks this is best option with his cardiac medications. Patient states he will call back Friday to report symptoms after  holding Crestor for 1 week. I spent > 25 minutes on phone with patient and he thanked me for my time.

## 2018-01-01 NOTE — Telephone Encounter (Signed)
New message   Pt c/o of Chest Pain: STAT if CP now or developed within 24 hours  1. Are you having CP right now? Yes   2. Are you experiencing any other symptoms (ex. SOB, nausea, vomiting, sweating)? Nausea   3. How long have you been experiencing CP? Since about 8:30 am this morning  4. Is your CP continuous or coming and going? Continuous   5. Have you taken Nitroglycerin? Nitroglycerin was taken about 30 minutes ago ?

## 2018-01-01 NOTE — Telephone Encounter (Signed)
Mr. Acuna reports almost daily angina since seeing Dr. Acie Fredrickson 12/9. He has had anginal symptoms since this morning at 0830. He took 1 tablet of NTG at 1030 and he is currently asymptomatic. He states he has taken NTG tablets 4-5 times over the last week and it alleviates the CP.  On days he has not taken NTG, he takes Xanax and that alleviates his symptoms.  He is taking his medications as directed (including Imdur and Ranexa).  His BP usually runs around 120/80 and HR in the 70s (he is diligent about checking VS several times daily).   He understands he will be called with Dr. Elmarie Shiley recommendations.

## 2018-01-01 NOTE — Telephone Encounter (Signed)
Agree with note by Michelle Swinyer, RN  

## 2018-01-02 DIAGNOSIS — R1084 Generalized abdominal pain: Secondary | ICD-10-CM | POA: Diagnosis not present

## 2018-01-02 DIAGNOSIS — R634 Abnormal weight loss: Secondary | ICD-10-CM | POA: Diagnosis not present

## 2018-01-04 MED FILL — ALPRAZolam 0.5 MG TABS: 0.5 | 30 days supply | Qty: 90 | Fill #0

## 2018-01-05 NOTE — Telephone Encounter (Signed)
See telephone encounter from 12/16 with updated information

## 2018-01-05 NOTE — Telephone Encounter (Signed)
Called patient and advised him that per Dr. Acie Fredrickson would like him to continue Brilinta at this time and that we will reassess in a few weeks. Patient verbalized understanding and agreement with plan and thanked me for the call.

## 2018-01-05 NOTE — Telephone Encounter (Signed)
I agree with note by Christen Bame, RN. I do not think the Brilinta is causing his GI issues but it cannot be ruled out. Have him continue brilinta for another 2 weeks and see how he is feeling

## 2018-01-05 NOTE — Telephone Encounter (Signed)
Received call and MyChart message from patient this morning with a list of questions pertaining to symptoms of fatigue, loss of appetite, and anxiety. Patient had GI consult yesterday and is moving forward with work-up for GI issues. I answered questions to his satisfaction and advised that per Dr. Acie Fredrickson he may proceed with receiving either anesthesia or contrast for procedures. He would not be able to hold his Brilinta for cutting/biopsy at this time due to stenting 12/12/17.  In regards to Crestor, Dr. Acie Fredrickson feels it would be best to proceed with referral to lipid clinic for initiation of PCSK9 inhibitor due to less side effects. Patient is scheduled with [facility] pharmacist on 12/24.   Patient asks about switching from Brilinta to Plavix due to he has read about less side effects. I advised that I will forward to Dr. Acie Fredrickson for his review. He also has questions about side effects of Metoprolol. I advised that we should not change multiple medications at one time so that we can determine resolution of side effects associated with each medication. I advised I will call back with Dr. Elmarie Shiley advice regarding Brilinta. Patient verbalized understanding and agreement with plan of care.

## 2018-01-07 MED FILL — BRILINTA 90 MG TABLET: 90 | 30 days supply | Qty: 60 | Fill #1

## 2018-01-07 MED FILL — METOPROLOL SUCCINATE ER 100: 100 | 30 days supply | Qty: 60 | Fill #1

## 2018-01-08 MED FILL — ZOLPIDEM TARTRATE 10 MG TAB: 10 | 30 days supply | Qty: 30 | Fill #3

## 2018-01-08 NOTE — Progress Notes (Signed)
Patient ID: David Conner                 DOB: 12-Mar-1960                    MRN: [id number]     HPI: David Conner is a 57 y.o. male patient of Dr. Acie Fredrickson that presents today for lipid evaluation.  PMH includes CAD. He was admitted to the hospital in October 2019.  He was found to have a tight LAD stenosis which was stented.  He has a chronic total occlusion of a moderate-sized diagonal vessel.  He also has moderate to severe disease in his distal right coronary artery and obtuse marginal artery.  He presents today for discussion of cholesterol. Has lost appetite and lost 23 lbs over the last few weeks. He stopped his rosuvastatin about 10 days ago. He states he is unsure if this has improved his back pain as he started Xanax around the same time and believes that this takes the edge off. He thinks there may have been some improvement. He reports that he was prescribed atorvastatin previously, but never actually took a dose of the medication.   Risk Factors: CAD s/p stenting LDL Goal: <70  Current Medications: none Intolerances: Crestor (muscle aches)  Diet: not eating much currently. 2/3rd out at cafeteria so can get vegetables. He eats oatmeal bananas and fruit. He is trying to eat better. He eats mostly chicken. He beef about once per week, but it is lean. He drinks mostly water. He drinks diet soda.   Exercise: He is walking at work. He is limited due to "hitting a wall."  Family History: Bladder Cancer in his father; CAD in his maternal grandfather and mother; Diabetes in his father; Healthy in his sister; Hyperlipidemia in his mother; Hypertension in his father and mother.  Social History: denies alcohol  Labs: 11/11/17: TC 191, TG 38, HDL 41, LDL 142 (no therapy)  Past Medical History:  Diagnosis Date  . Allergy   . Complication of anesthesia    upper gi done, had anxiety with versed  . Coronary artery disease    11/13/17:PCI to mid LAD using Resolute Onyx 3.0 x 18 mm  DES  . Depression    slight, on sertraline  . ED (erectile dysfunction)   . GERD (gastroesophageal reflux disease)   . Hyperlipidemia   . Nausea occasional  . Restless legs syndrome     Current Outpatient Medications on File Prior to Visit  Medication Sig Dispense Refill  . ALPRAZolam (XANAX) 0.5 MG tablet Take 0.5 mg by mouth 2 (two) times daily as needed for anxiety.    [name] aspirin EC 81 MG tablet Take 81 mg by mouth daily.     [name] esomeprazole (NEXIUM) 40 MG capsule Take 40 mg by mouth daily.     . isosorbide mononitrate (IMDUR) 30 MG 24 hr tablet Take 1 tablet (30 mg total) by mouth daily. 90 tablet 3  . metoprolol succinate (TOPROL-XL) 100 MG 24 hr tablet Take 1 tablet (100 mg total) by mouth 2 (two) times daily. Take with or immediately following a meal. 180 tablet 3  . nitroGLYCERIN (NITROSTAT) 0.3 MG SL tablet Place 0.3 mg under the tongue every 5 (five) minutes as needed for chest pain.     . ranolazine (RANEXA) 500 MG 12 hr tablet Take 1 tablet (500 mg total) by mouth 2 (two) times daily. 60 tablet 11  . Testosterone (ANDROGEL PUMP) 20.25  MG/ACT (1.62%) GEL Place 40.5 mg onto the skin daily. 2 pumps    . ticagrelor (BRILINTA) 90 MG TABS tablet Take 1 tablet (90 mg total) by mouth 2 (two) times daily. 180 tablet 9  . zolpidem (AMBIEN) 10 MG tablet Take 10 mg by mouth at bedtime.  1   No current facility-administered medications on file prior to visit.     Allergies  Allergen Reactions  . Lansoprazole Other (See Comments)    Dizziness   . Ofloxacin Other (See Comments)    insomnia  . Penicillin G Hives and Other (See Comments)    Childhood allergic reaction Has patient had a PCN reaction causing immediate rash, facial/tongue/throat swelling, SOB or lightheadedness with hypotension: Yes Has patient had a PCN reaction causing severe rash involving mucus membranes or skin necrosis: No Has patient had a PCN reaction that required hospitalization: No Has patient had a PCN  reaction occurring within the last 10 years: No If all of the above answers are "NO", then may proceed with Cephalosporin use.  . Versed [Midazolam] Anxiety    Pt reports that he had anxiety 24hr post procedure. Versed was given during previous catherization without adverse affects.     Assessment/Plan: Hyperlipidemia: LDL is not at goal on most recent panel and pt unable to tolerate rosuvastatin due to muscle symptoms. He has only trialed one statin. Will try atorvastatin at low dose to see if able to tolerate. Discussed that may need to increase or add ezetimibe to bring LDL to goal 70mg /dL. Also discussed PCSK9i therapy as an option if he is unable to tolerate statin therapy. Provided him phone number to lipid clinic to call if unable to tolerate atorvastatin. Will follow up in 4 weeks to determine tolerability and discuss dose titration vs. Schedule labs.     Thank you,  Lelan Pons. Patterson Hammersmith, [facility]  01/08/2018 4:13 PM

## 2018-01-09 ENCOUNTER — Encounter: Payer: Self-pay | Admitting: Pharmacist

## 2018-01-09 ENCOUNTER — Ambulatory Visit: Payer: 59 | Admitting: Pharmacist

## 2018-01-09 DIAGNOSIS — E782 Mixed hyperlipidemia: Secondary | ICD-10-CM | POA: Diagnosis not present

## 2018-01-09 MED ORDER — ATORVASTATIN CALCIUM 10 MG PO TABS: 10.0000 mg | ORAL_TABLET | Freq: Every day | ORAL | 3 refills | Status: DC

## 2018-01-09 MED FILL — ATORVASTATIN 10 MG TABLET: 10 | 30 days supply | Qty: 30 | Fill #0

## 2018-01-09 NOTE — Patient Instructions (Addendum)
START atorvastatin 10mg  once daily in the evening next week.   If you have any issues with the medication please contact the clinic at [phone].  We will contact you in about 4 weeks to determine your tolerability and schedule labs at that time.   If unable to tolerate we will consider injections (Repatha or Praluent).    Cholesterol Cholesterol is a white, waxy, fat-like substance that is needed by the human body in small amounts. The liver makes all the cholesterol we need. Cholesterol is carried from the liver by the blood through the blood vessels. Deposits of cholesterol (plaques) may build up on blood vessel (artery) walls. Plaques make the arteries narrower and stiffer. Cholesterol plaques increase the risk for heart attack and stroke. You cannot feel your cholesterol level even if it is very high. The only way to know that it is high is to have a blood test. Once you know your cholesterol levels, you should keep a record of the test results. Work with your health care provider to keep your levels in the desired range. What do the results mean?  Total cholesterol is a rough measure of all the cholesterol in your blood.  LDL (low-density lipoprotein) is the "bad" cholesterol. This is the type that causes plaque to build up on the artery walls. You want this level to be low.  HDL (high-density lipoprotein) is the "good" cholesterol because it cleans the arteries and carries the LDL away. You want this level to be high.  Triglycerides are fat that the body can either burn for energy or store. High levels are closely linked to heart disease. What are the desired levels of cholesterol?  Total cholesterol below 200.  LDL below 100 for people who are at risk, below 70 for people at very high risk.  HDL above 40 is good. A level of 60 or higher is considered to be protective against heart disease.  Triglycerides below 150. How can I lower my cholesterol? Diet Follow your diet  program as told by your health care provider.  Choose fish or white meat chicken and [place], roasted or baked. Limit fatty cuts of red meat, fried foods, and processed meats, such as sausage and lunch meats.  Eat lots of fresh fruits and vegetables.  Choose whole grains, beans, pasta, potatoes, and cereals.  Choose olive oil, corn oil, or canola oil, and use only small amounts.  Avoid butter, mayonnaise, shortening, or palm kernel oils.  Avoid foods with trans fats.  Drink skim or nonfat milk and eat low-fat or nonfat yogurt and cheeses. Avoid whole milk, cream, ice cream, egg yolks, and full-fat cheeses.  Healthier desserts include angel food cake, ginger snaps, animal crackers, hard candy, popsicles, and low-fat or nonfat frozen yogurt. Avoid pastries, cakes, pies, and cookies.  Exercise  Follow your exercise program as told by your health care provider. A regular program: ? Helps to decrease LDL and raise HDL. ? Helps with weight control.  Do things that increase your activity level, such as gardening, walking, and taking the stairs.  Ask your health care provider about ways that you can be more active in your daily life. Medicine  Take over-the-counter and prescription medicines only as told by your health care provider. ? Medicine may be prescribed by your health care provider to help lower cholesterol and decrease the risk for heart disease. This is usually done if diet and exercise have failed to bring down cholesterol levels. ? If you have several risk  factors, you may need medicine even if your levels are normal. This information is not intended to replace advice given to you by your health care provider. Make sure you discuss any questions you have with your health care provider. Document Released: 09/28/2000 Document Revised: 08/01/2015 Document Reviewed: 07/04/2015 Elsevier Interactive Patient Education  [employer].

## 2018-01-12 ENCOUNTER — Other Ambulatory Visit: Payer: 59

## 2018-01-12 DIAGNOSIS — K5900 Constipation, unspecified: Secondary | ICD-10-CM | POA: Diagnosis not present

## 2018-01-12 DIAGNOSIS — K7689 Other specified diseases of liver: Secondary | ICD-10-CM | POA: Diagnosis not present

## 2018-01-12 DIAGNOSIS — R1084 Generalized abdominal pain: Secondary | ICD-10-CM | POA: Diagnosis not present

## 2018-01-12 DIAGNOSIS — N200 Calculus of kidney: Secondary | ICD-10-CM | POA: Diagnosis not present

## 2018-01-12 DIAGNOSIS — R634 Abnormal weight loss: Secondary | ICD-10-CM | POA: Diagnosis not present

## 2018-01-15 ENCOUNTER — Telehealth (HOSPITAL_COMMUNITY): Payer: Self-pay

## 2018-01-15 DIAGNOSIS — Z23 Encounter for immunization: Secondary | ICD-10-CM | POA: Diagnosis not present

## 2018-01-15 NOTE — Telephone Encounter (Signed)
Cardiac Rehab Medication Review by a Pharmacist  Does the patient  feel that his/her medications are working for him/her?  yes  Has the patient been experiencing any side effects to the medications prescribed?  no  Does the patient measure his/her own blood pressure or blood glucose at home?  Yes   Does the patient have any problems obtaining medications due to transportation or finances?   no  Understanding of regimen: good Understanding of indications: good Potential of compliance: good    Pharmacist comments:  Patient reports no new symptoms. He reports he has burning in sternum related to when he "over dose it." He has started taking Xanax 2-3 times a day and reports that it helps him with normal activities and relieves the burning sensation. Patient also reports he has lack of appetite along that accompanies the pain and has lost 23lbs in two months. GI and cardiology are aware of the patient's current issues.  Patient does report he is discouraged with the ongoing pain and feels like he cannot live a normal life. He is worried Xanax is masking symptoms. Patient denies suicidal thoughts. He was prescribe sertraline by his PCP but has not started it due to potential side effects. Patient encouraged to discuss with PCP and try sertraline if still indicated as it is a safer anxiolytic for long term use.   Isaias Sakai, Sherian Rein D PGY1 Pharmacy Resident  01/15/2018      3:58 PM

## 2018-01-16 ENCOUNTER — Other Ambulatory Visit: Payer: Self-pay | Admitting: Cardiovascular Disease

## 2018-01-16 ENCOUNTER — Telehealth: Payer: Self-pay | Admitting: Cardiovascular Disease

## 2018-01-16 MED ORDER — RANOLAZINE ER 1000 MG PO TB12: 1000.0000 mg | ORAL_TABLET | Freq: Two times a day (BID) | ORAL | 11 refills | Status: DC

## 2018-01-16 MED FILL — RANOLAZINE ER 1000 MG TB12: 1000 | 30 days supply | Qty: 60 | Fill #0

## 2018-01-16 NOTE — Telephone Encounter (Signed)
Called patient and reviewed Dr. Elmarie Shiley advice with him. He verbalized agreement to increase Ranexa to 1000 mg BID. I scheduled him for EKG/Nurse visit on 1/2 with me. I advised patient to call back with questions or concerns prior to appointment and he thanked me for the call.

## 2018-01-16 NOTE — Telephone Encounter (Signed)
I received a message from cardiac rehab that David Conner continues to have sternal pain. We are aware of this pain . He has severe CAD involving the distal LAD and there are no options for revascularization.  He has had a repeat cath. He has walked on a Bruce protocol GXT for 9 minutes and did not have any arrhythmias or severe CP  He is on Ranexa 500 mg BID Please increase his Ranexa to 100 mg PO BID. Will see how he is doing when he comes to his office visit in several weeks    Please also ask him to come in for an ECG this week to make sure his QT interval is OK .    Will get a repeat ECG at his office visit in several weeks .

## 2018-01-16 NOTE — Addendum Note (Signed)
Addended by: Emmaline Life on: 01/16/2018 10:16 AM   Modules accepted: Orders

## 2018-01-18 ENCOUNTER — Ambulatory Visit (INDEPENDENT_AMBULATORY_CARE_PROVIDER_SITE_OTHER): Payer: 59 | Admitting: Nurse Practitioner

## 2018-01-18 VITALS — BP 110/80 | HR 64 | Ht 70.5 in | Wt 204.4 lb

## 2018-01-18 DIAGNOSIS — R0789 Other chest pain: Secondary | ICD-10-CM | POA: Diagnosis not present

## 2018-01-18 DIAGNOSIS — I214 Non-ST elevation (NSTEMI) myocardial infarction: Secondary | ICD-10-CM

## 2018-01-18 DIAGNOSIS — R5383 Other fatigue: Secondary | ICD-10-CM

## 2018-01-18 MED FILL — TESTOSTERONE 20.25 MG/ACT (: 20.25 MG/AC | 30 days supply | Qty: 75 | Fill #0

## 2018-01-18 NOTE — Patient Instructions (Addendum)
Medication Instructions:  Your physician recommends that you continue on your current medications as directed. Please refer to the Current Medication list given to you today.  If you need a refill on your cardiac medications before your next appointment, please call your pharmacy.   Lab work: None Ordered  If you have labs (blood work) drawn today and your tests are completely normal, you will receive your results only by: [name] MyChart Message (if you have MyChart) OR . A paper copy in the mail If you have any lab test that is abnormal or we need to change your treatment, we will call you to review the results.   Testing/Procedures: Your physician has requested that you have a cardiac MRI. Cardiac MRI uses a computer to create images of your heart as its beating, producing both still and moving pictures of your heart and major blood vessels. For further information please visit [email or web address] Please follow the instruction sheet given to you today for more information.    Follow-Up: Your physician recommends that you keep your follow-up appointment on January 16 with Dr. Acie Fredrickson

## 2018-01-18 NOTE — Progress Notes (Signed)
1.) Reason for visit: EKG for evaluation of QT interval for increase of Ranexa to 1000 mg BID on 12/31  2.) Name of MD requesting visit: Dr. Acie Fredrickson  3.) H&P: Patient presents for EKG after increasing Ranexa to 1000 mg BID on 12/31. He reports he has had 4 doses thus far and is feeling relatively the same as before the increase.   4.) ROS related to problem: Patient continues to c/o midsternal chest pain that arises after activity and in association with nausea and an unusual feeling in his stomach  5.) Assessment and plan per MD: EKG reviewed by Dr. Lovena Le, DOD, who reports no concerns. Plan of care was  discussed with Dr. Acie Fredrickson on 12/31 who advised patient may benefit from cardiac MRI since symptoms persist despite aggressive medical therapy. Patient would like to proceed. Follow-up on 02/01/18 is already scheduled. Patient verbalized understanding and agreement with plan of care and left the office in NAD.

## 2018-01-19 DIAGNOSIS — Z955 Presence of coronary angioplasty implant and graft: Secondary | ICD-10-CM | POA: Diagnosis not present

## 2018-01-19 DIAGNOSIS — R1013 Epigastric pain: Secondary | ICD-10-CM | POA: Diagnosis not present

## 2018-01-19 DIAGNOSIS — I2510 Atherosclerotic heart disease of native coronary artery without angina pectoris: Secondary | ICD-10-CM | POA: Diagnosis not present

## 2018-01-22 ENCOUNTER — Telehealth (HOSPITAL_COMMUNITY): Payer: Self-pay | Admitting: *Deleted

## 2018-01-22 NOTE — Telephone Encounter (Signed)
In preparation for pt upcoming appt for orientation, pt needs to have a cardiac MRI for ?anginal equivalent symptoms on exertion despite aggressive medical treatment.  Talked with pt extensively regarding the contraindications for participation in Cardiac Rehab ie unstable angina.  Advised pt that it would be best for him to hold off on coming to orientation until results of the MRI can be evaluated and a course of treatment identified.  Pt verbalized some disappointment and talked more regarding the usage of xanax and whether he could ever get off it.  Pt does endorse that he does not have the discomfort when he takes it but it also aware he has residual disease.  Asked pt to please contact cardiac rehab when the MRI has been scheduled.  Note that pt has follow up appt with Dr. Acie Fredrickson on 02/01/18. Cherre Huger, BSN Cardiac and Training and development officer

## 2018-01-24 ENCOUNTER — Telehealth: Payer: Self-pay | Admitting: Pharmacist

## 2018-01-24 MED ORDER — RANOLAZINE ER 500 MG PO TB12: 500.0000 mg | ORAL_TABLET | Freq: Two times a day (BID) | ORAL | 11 refills | Status: DC

## 2018-01-24 MED FILL — RANOLAZINE ER 500 MG TABLET: 500 | 30 days supply | Qty: 60 | Fill #0

## 2018-01-24 NOTE — Telephone Encounter (Signed)
Ranexa 500 mg BID called in to pharmacy.  Patient notified.

## 2018-01-24 NOTE — Addendum Note (Signed)
Addended by: Harland German A on: 01/24/2018 05:38 PM   Modules accepted: Orders

## 2018-01-24 NOTE — Telephone Encounter (Addendum)
Patient called stating her has had increased nausea and no appetitie. He has recently had an increase in ranexa to 1000mg  BID and was started on atorvastatin 10mg  daily. He was wondering if it would be ok to hold atorvastatin for a few days. I recommended that he hold atorvastatin and reduce the dose of ranexa to 500mg  BID for several days to see if his symptoms improve. If he starts to feel better, I recommended he reintroduce one by one.   Cannot split ranexa tablets due to be being slow release. I will route to MD for approval to send rx for 500mg  BID to [facility] pharmacy

## 2018-01-24 NOTE — Telephone Encounter (Signed)
Agree with the plan .  If he does not tolerate the statin, will need to strongly consider him for PCSK9 inhibitor He has had abdominal pain long before we started the Ranexa so I doubt that that is the issue.  I am okay with decreasing the dose to see if it helps. He did not mention that the abdominal pain increased when he increase the dose of Ranexa a week ago. I discussed the case with Dr. Greer Pickerel of [facility] surgery. He will be doing a CT angiogram of the abdomen to look for bowel ischemia. We Also doing a cardiac MRI.

## 2018-01-25 ENCOUNTER — Ambulatory Visit (HOSPITAL_COMMUNITY): Payer: 59

## 2018-01-26 ENCOUNTER — Telehealth: Payer: Self-pay | Admitting: Cardiovascular Disease

## 2018-01-26 ENCOUNTER — Other Ambulatory Visit: Payer: Self-pay | Admitting: General Surgery

## 2018-01-26 DIAGNOSIS — K219 Gastro-esophageal reflux disease without esophagitis: Secondary | ICD-10-CM | POA: Diagnosis not present

## 2018-01-26 DIAGNOSIS — R1013 Epigastric pain: Secondary | ICD-10-CM

## 2018-01-26 DIAGNOSIS — E291 Testicular hypofunction: Secondary | ICD-10-CM | POA: Diagnosis not present

## 2018-01-26 MED ORDER — ISOSORBIDE MONONITRATE ER 60 MG PO TB24: 60.0000 mg | ORAL_TABLET | Freq: Every day | ORAL | 11 refills | Status: DC

## 2018-01-26 MED FILL — PROMETHAZINE 12.5 MG TABLET: 12.5 | 5 days supply | Qty: 30 | Fill #0

## 2018-01-26 NOTE — Telephone Encounter (Signed)
New Message   Pt c/o medication issue:  1. Name of Medication: Renexa  2. How are you currently taking this medication (dosage and times per day)? 500mg  twice a day  3. Are you having a reaction (difficulty breathing--STAT)? Severe Nausea   4. What is your medication issue? Patient having severe nausea and believe it's the Renexa and wants to discontinue the med to see if it's the issue or a medical issue.

## 2018-01-26 NOTE — Telephone Encounter (Signed)
Spoke with patient who states he believes his nausea may be related to taking Ranexa. He would like to discontinue the Ranexa. I advised that I have reviewed with Dr. Acie Fredrickson and he agrees and would like patient to increase Imdur to 60 mg daily. I answered his questions about timing of medications and advised he may continue to take Xanax as needed. He has an appointment with Dr. Acie Fredrickson on Thursday 1/16 and will call back prior to that time with questions or concerns. He thanked me for the call.

## 2018-01-29 ENCOUNTER — Ambulatory Visit (HOSPITAL_COMMUNITY): Payer: 59

## 2018-01-30 ENCOUNTER — Telehealth (HOSPITAL_COMMUNITY): Payer: Self-pay | Admitting: *Deleted

## 2018-01-30 ENCOUNTER — Telehealth: Payer: Self-pay | Admitting: Pharmacist

## 2018-01-30 NOTE — Telephone Encounter (Signed)
Called and left message for pt to re schedule for cardiac rehab. Received verbal okay from Dr. Acie Fredrickson nurse ok to proceed with orientation with the hope that his Cardiac MRI will be completed soon prior to his follow up app on 1/16.  Noted that we have a cancellation for next Tuesday 1/21 at 8:00.  Checking for his availability to attend. Contact information left for pt. Cherre Huger, BSN Cardiac and Training and development officer

## 2018-01-30 NOTE — Telephone Encounter (Signed)
   Patient is very anxious and states that he really needs to talk to Dr Acie Fredrickson today and it cannot wait till his visit on 02/01/18. He's had nausea, anxiety, appetite loss and severe fatigue/weakness

## 2018-01-30 NOTE — Telephone Encounter (Signed)
Patient calling complaining of increased anxity, loss of concentration and loss of appetite. We recently stopped patient atorvastatin and reduced ranexa dose to see if this helped. Patient is now off of ranexa and his imdur dose was doubled. Patient calling asking if metoprolol, brilinita or imdur could be causing his symptoms. Patient takes xanex tid for his nausea and anxiety but no longer controlling his symptoms. However overnight he could go 12 or more hours without xanex dose. He has an abdominal scan workup on Friday for his continued nausea.  Although associated with nausea is associated with metoprolol (1%) and brilinita (4%). I do not think this is causing patients symptoms. Imdur is associated with loss of concentration and anxiety and increased dose could be worsening symptoms, but I do not think it is the whole cause. Patient states he took 2 does of zoloft over the weekend but then stopped- again, this could be contributing, but im not sure how much 2 doses would affect this. Patient concerned he needs a higher dose of xanex. I told him I could not advise this. He would need to speak with his doctor how is prescribing it. He states his GI dr Laurian Brim titrate it and his PCP told him to see a physiatrist and wont write for more. I agree he should see a physiatrist as benzo can be very addicting and cause symptoms to be worse I advised patient that he needs to talk to Dr. Cathie Olden before stopping any of his heart medications as they are very important.  I stressed the importance of his GI workup as I am not entirely convinced his medications are the cause (or atleast the sole cause) of his symptoms

## 2018-01-31 ENCOUNTER — Ambulatory Visit (HOSPITAL_COMMUNITY): Payer: 59

## 2018-01-31 NOTE — Telephone Encounter (Signed)
Communication sent to patient via MyChart

## 2018-02-01 ENCOUNTER — Encounter: Payer: Self-pay | Admitting: Cardiovascular Disease

## 2018-02-01 ENCOUNTER — Ambulatory Visit (INDEPENDENT_AMBULATORY_CARE_PROVIDER_SITE_OTHER): Payer: 59 | Admitting: Cardiovascular Disease

## 2018-02-01 VITALS — BP 110/72 | HR 77 | Ht 70.5 in | Wt 198.8 lb

## 2018-02-01 DIAGNOSIS — Z9861 Coronary angioplasty status: Secondary | ICD-10-CM | POA: Diagnosis not present

## 2018-02-01 DIAGNOSIS — E782 Mixed hyperlipidemia: Secondary | ICD-10-CM

## 2018-02-01 DIAGNOSIS — I2510 Atherosclerotic heart disease of native coronary artery without angina pectoris: Secondary | ICD-10-CM | POA: Diagnosis not present

## 2018-02-01 MED ORDER — ISOSORBIDE MONONITRATE ER 30 MG PO TB24: 30.0000 mg | ORAL_TABLET | Freq: Every day | ORAL | 11 refills | Status: DC

## 2018-02-01 MED FILL — ISOSORBIDE MN ER 60 MG TAB: 60 | 30 days supply | Qty: 30 | Fill #0

## 2018-02-01 NOTE — Progress Notes (Signed)
David Conner 58 y.o. male DOB 03-16-1960 MRN [id number]       Nutrition  No diagnosis found. Past Medical History:  Diagnosis Date  . Allergy   . Complication of anesthesia    upper gi done, had anxiety with versed  . Coronary artery disease    11/13/17:PCI to mid LAD using Resolute Onyx 3.0 x 18 mm DES  . Depression    slight, on sertraline  . ED (erectile dysfunction)   . GERD (gastroesophageal reflux disease)   . Hyperlipidemia   . Nausea occasional  . Restless legs syndrome    Meds reviewed.    Current Outpatient Medications (Endocrine & Metabolic):  [name]  Testosterone (ANDROGEL PUMP) 20.25 MG/ACT (1.62%) GEL, Place 40.5 mg onto the skin daily. 2 pumps  Current Outpatient Medications (Cardiovascular):  .  atorvastatin (LIPITOR) 10 MG tablet, Take 1 tablet (10 mg total) by mouth daily. (Patient not taking: Reported on 02/01/2018) .  isosorbide mononitrate (IMDUR) 30 MG 24 hr tablet, Take 1 tablet (30 mg total) by mouth daily. .  metoprolol succinate (TOPROL-XL) 100 MG 24 hr tablet, Take 1 tablet (100 mg total) by mouth 2 (two) times daily. Take with or immediately following a meal. .  nitroGLYCERIN (NITROSTAT) 0.3 MG SL tablet, Place 0.3 mg under the tongue every 5 (five) minutes as needed for chest pain.    Current Outpatient Medications (Analgesics):  .  aspirin EC 81 MG tablet, Take 81 mg by mouth daily.   Current Outpatient Medications (Hematological):  .  ticagrelor (BRILINTA) 90 MG TABS tablet, Take 1 tablet (90 mg total) by mouth 2 (two) times daily.  Current Outpatient Medications (Other):  [name]  ALPRAZolam (XANAX) 0.5 MG tablet, Take 0.5 mg by mouth 3 (three) times daily as needed for anxiety.  [name]  esomeprazole (NEXIUM) 40 MG capsule, Take 40 mg by mouth daily.  .  polyethylene glycol (MIRALAX / GLYCOLAX) packet, Take 17 g by mouth daily. [name]  zolpidem (AMBIEN) 10 MG tablet, Take 10 mg by mouth at bedtime.   HT: Ht Readings from Last 1 Encounters:  02/01/18 5'  10.5" (1.791 m)    WT: Wt Readings from Last 5 Encounters:  02/01/18 198 lb 12.8 oz (90.2 kg)  01/18/18 204 lb 6 oz (92.7 kg)  12/25/17 207 lb (93.9 kg)  12/12/17 205 lb (93 kg)  12/01/17 213 lb (96.6 kg)     BMI = 28.11 (02/01/18)  Current tobacco use? No       Labs:  Lipid Panel     Component Value Date/Time   CHOL 191 11/11/2017 2043   TRIG 38 11/11/2017 2043   HDL 41 11/11/2017 2043   CHOLHDL 4.7 11/11/2017 2043   VLDL 8 11/11/2017 2043   LDLCALC 142 (H) 11/11/2017 2043    Lab Results  Component Value Date   HGBA1C 5.3 11/11/2017   CBG (last 3)  No results for input(s): GLUCAP in the last 72 hours.  Nutrition Diagnosis ? Food-and nutrition-related knowledge deficit related to lack of exposure to information as related to diagnosis of: ? CVD  ? Overweight  related to excessive energy intake as evidenced by a BMI = 28.11 (02/01/18)   Nutrition Goal(s):  ? To be determined  Plan:  Pt to attend nutrition classes ? Nutrition I ? Nutrition II ? Portion Distortion  Will provide client-centered nutrition education as part of interdisciplinary care.   Monitor and evaluate progress toward nutrition goal with team.  Laurina Bustle, MS, RD, LDN  02/01/2018 7:52 PM

## 2018-02-01 NOTE — Progress Notes (Signed)
Cardiology Office Note:    Date:  02/01/2018   ID:  David Conner, DOB [birth year], MRN [id number]  PCP:  David Melter, MD  Cardiologist:  David Moores, MD  Electrophysiologist:  None   Referring MD: David Melter, MD   Chief Complaint  Patient presents with  . Coronary Artery Disease  . Anxiety    Nov. 15, 2019   David Conner is a 58 y.o. male with a recent diagnosis of coronary artery disease.  He was admitted to the hospital in October.  He was found to have a tight LAD stenosis which was stented.  He has a chronic total occlusion of a moderate-sized diagonal vessel.  He also has moderate to severe disease in his distal right coronary artery and obtuse marginal artery.  David Conner to have episodes of chest discomfort.  He saw David Ades, NP week and his  Toprol-XL was  doubled and Imdur 30  was added . Also added Xanax   Has not had any syncope or presyncope  He seems to have fewer chest pains since the increase in meds  Also admits that he has not done as much   Dec. 9, 2019:  David Conner seen today for follow-up visit.  Wt = 207 lbs.  He was admitted to the hospital with symptoms of unstable angina.  He had PCI of his obtuse marginal arteries.  He still has severe diffuse disease involving his distal  left anterior descending artery. His D1 is 100%  occluded.   His angina is clearly better.   Has been taking it easy  Still has some chest pressure when he over did it last week  Is walking on the treadmill -  No angina when walking on the treadmill  Jan. 16, 2020  David Conner is seen back today for follow up visit He is still consumed by his diagnosis.   Seems to have lots of anxiety.  He has had stenting of his mid LAD Oct. 28, 2019.  The terminal LAD remains very small and severely diseased and is not a good target for revascularization Because of his numerous and sometimes conflicting symptoms, we scheduled a GXT He walked for 9 minutes of a Bruce protocol .   Achieved a peak HR of 148 ( 90% predicted max HR ) and had no angina symptoms and no ECG changes.  He stopped due to fagiue and dyspnea.  We enrolled him in cardiac rehab but he has not started yet     He continued to have angina and returned to the cath lab on Nov. 26, 2019 and had stenting of his OM1 and OM2.  Because of his continued symptoms, we started Ranexa which is tolerated very poorly and has stopped   He is unable to eat and has lost weight. We have held is Rosuvastatin to see if this helps with the anorexia.    He has been evaluated by [facility] and has a CT angio of the abdomen and pelvis scheduled tomorrow  to look for bowel ischemia .   Has intermittant sharp pain in his chest .   Typically if he does some exercise for a prolonged time - 30 min - 1 hr.  Relieved with Xanax  We tried Ranexa but caused him significant GI pain , nausea  We have tried higher dose Imdur 60 mg - has not tried to walk on his own at this time   Takes Xanax 3 times a day .  Has lost 30 lbs  Has also been given Certriline but he became very resless and so he went back to the xanax .   Has been referred to a psychiatrist   His main complaint today is lack of appetite and subsequent fatigue   Past Medical History:  Diagnosis Date  . Allergy   . Complication of anesthesia    upper gi done, had anxiety with versed  . Coronary artery disease    11/13/17:PCI to mid LAD using Resolute Onyx 3.0 x 18 mm DES  . Depression    slight, on sertraline  . ED (erectile dysfunction)   . GERD (gastroesophageal reflux disease)   . Hyperlipidemia   . Nausea occasional  . Restless legs syndrome     Past Surgical History:  Procedure Laterality Date  . CHOLECYSTECTOMY N/A 02/18/2013   Procedure: LAPAROSCOPIC CHOLECYSTECTOMY WITH INTRAOPERATIVE CHOLANGIOGRAM, DIAGNOSTIC LAPAROSCOPY, LYSIS OF ADHESIONS ;  Surgeon: [name], MD;  Location: [facility];  Service: General;   Laterality: N/A;  . colonscopy  2 years ago  . CORONARY STENT INTERVENTION  11/13/2017  . CORONARY STENT INTERVENTION N/A 11/13/2017   Procedure: CORONARY STENT INTERVENTION;  Surgeon: David Bush, MD;  Location: [facility];  Service: Cardiovascular;  Laterality: N/A;  . cortisone injection Left 3 weeks ago   dr thinks is bursitis  . esophagus stretching     7-8 times  . LAPAROSCOPIC APPENDECTOMY N/A 02/18/2013   Procedure: APPENDECTOMY LAPAROSCOPIC;  Surgeon: [name], MD;  Location: [facility];  Service: General;  Laterality: N/A;  DR David Conner SPOKE WITH PT FAMILY DURING SURGERY ABOUT REMOVING PT APPENDIX DUE TO MALROTATION. FAMILY STATED TO PROCEDE WITH REMOVAL OF APPENDIX.   [name] LEFT HEART CATH AND CORONARY ANGIOGRAPHY N/A 11/13/2017   Procedure: LEFT HEART CATH AND CORONARY ANGIOGRAPHY;  Surgeon: David Bush, MD;  Location: [facility];  Service: Cardiovascular;  Laterality: N/A;  . LEFT HEART CATH AND CORONARY ANGIOGRAPHY N/A 12/12/2017   Procedure: LEFT HEART CATH AND CORONARY ANGIOGRAPHY;  Surgeon: Martinique, Peter M, MD;  Location: [facility];  Service: Cardiovascular;  Laterality: N/A;    Current Medications: Current Meds  Medication Sig  . ALPRAZolam (XANAX) 0.5 MG tablet Take 0.5 mg by mouth 3 (three) times daily as needed for anxiety.   [name] aspirin EC 81 MG tablet Take 81 mg by mouth daily.   [name] esomeprazole (NEXIUM) 40 MG capsule Take 40 mg by mouth daily.   . metoprolol succinate (TOPROL-XL) 100 MG 24 hr tablet Take 1 tablet (100 mg total) by mouth 2 (two) times daily. Take with or immediately following a meal.  . nitroGLYCERIN (NITROSTAT) 0.3 MG SL tablet Place 0.3 mg under the tongue every 5 (five) minutes as needed for chest pain.   . polyethylene glycol (MIRALAX / GLYCOLAX) packet Take 17 g by mouth daily.  . Testosterone (ANDROGEL PUMP) 20.25 MG/ACT (1.62%) GEL Place 40.5 mg onto the skin daily. 2 pumps  . ticagrelor (BRILINTA) 90 MG TABS tablet  Take 1 tablet (90 mg total) by mouth 2 (two) times daily.  [name] zolpidem (AMBIEN) 10 MG tablet Take 10 mg by mouth at bedtime.  . [DISCONTINUED] isosorbide mononitrate (IMDUR) 60 MG 24 hr tablet Take 1 tablet (60 mg total) by mouth daily.     Allergies:   Lansoprazole; Ofloxacin; Penicillin g; and Versed [midazolam]   Social History   Socioeconomic History  . Marital status: Married    Spouse name: Not on  file  . Number of children: Not on file  . Years of education: Not on file  . Highest education level: Not on file  Occupational History  . Not on file  Social Needs  . Financial resource strain: Not on file  . Food insecurity:    Worry: Not on file    Inability: Not on file  . Transportation needs:    Medical: Not on file    Non-medical: Not on file  Tobacco Use  . Smoking status: Never Smoker  . Smokeless tobacco: Never Used  Substance and Sexual Activity  . Alcohol use: No  . Drug use: No  . Sexual activity: Yes    Birth control/protection: Sponge  Lifestyle  . Physical activity:    Days per week: Not on file    Minutes per session: Not on file  . Stress: Not on file  Relationships  . Social connections:    Talks on phone: Not on file    Gets together: Not on file    Attends religious service: Not on file    Active member of club or organization: Not on file    Attends meetings of clubs or organizations: Not on file    Relationship status: Not on file  Other Topics Concern  . Not on file  Social History Narrative  . Not on file     Family History: The patient's family history includes Bladder Cancer in his father; CAD in his maternal grandfather and mother; Diabetes in his father; Healthy in his sister; Hyperlipidemia in his mother; Hypertension in his father and mother.  ROS:      EKGs/Labs/Other Studies Reviewed:       EKG:     Recent Labs: 12/29/2017: ALT 52; BUN 8; Creatinine, Ser 1.08; Hemoglobin 15.7; Platelets 279; Potassium 4.0; Sodium 142    Recent Lipid Panel    Component Value Date/Time   CHOL 191 11/11/2017 2043   TRIG 38 11/11/2017 2043   HDL 41 11/11/2017 2043   CHOLHDL 4.7 11/11/2017 2043   VLDL 8 11/11/2017 2043   LDLCALC 142 (H) 11/11/2017 2043    Physical Exam:    Physical Exam: Blood pressure 110/72, pulse 77, height 5' 10.5" (1.791 Conner), weight 198 lb 12.8 oz (90.2 kg), SpO2 96 %.  GEN:  Well nourished, well developed in no acute distress HEENT: Normal NECK: No JVD; No carotid bruits LYMPHATICS: No lymphadenopathy CARDIAC: RRR   RESPIRATORY:  Clear to auscultation without rales, wheezing or rhonchi  ABDOMEN: Soft, non-tender, non-distended MUSCULOSKELETAL:  No edema; No deformity  SKIN: Warm and dry NEUROLOGIC:  Alert and oriented x 3    ASSESSMENT:    No diagnosis found. PLAN:       1.  Coronary artery disease:  Jaeceon has known coronary artery disease.  He has lots of vague chest pains but I am not convinced that all of these are due to angina.  Clearly some of it is angina but a lot of it is either GI or anxiety related.  We have tried him on increasing increasing doses of isosorbide but this only resulted in more orthostatic hypotension.  It really did not change the frequency of his episodes of chest pain. We also tried him on Ranexa but he did not tolerate this at all.  This clearly caused lots of nausea and anorexia.  This point I told him that he would likely have to get used to having some degree of angina.  He walked on a  Bruce protocol treadmill test for 9 minutes and did not have any evidence of ischemia and did not have any symptoms. Encouraged him to participate regularly with cardiac rehab.  He is scheduled to start fairly soon.  There is a slight chance that Brilinta may be causing some of this nausea although it is certainly not a common side effect. Offered to stop the Brilinta and start him on Plavix but I would like to wait until he is at least 5 to 6 months out if  possible. If nothing is found from a GI standpoint and if he continues to lose weight and we may have to change him from Brilinta to Plavix just to see if that helps.  2.  Anorexia and weight loss: He says that he is lost about 30 pounds since his heart attack in October. Reassured him that I do not think that this is cardiac related.  He is having a CT angiogram of his abdomen to look for mesenteric ischemia tomorrow.  Suspect that there is some degree of anxiety related to this.  He will continue to just to discuss with his gastroenterologist and his primary medical doctor.   He wants to take a medical leave from his job for 3 months.  I have asked him to consult with his primary medical doctor or gastroenterologist for this issue.  I do not think his main issues are necessarily cardiac related at this point.     2.  Hyperlipidemia: He does not tolerate statins well at all.  We will have him return to the lipid clinic.  He will need to be considered for PCSK9 inhibitor.     Medication Adjustments/Labs and Tests Ordered: Current medicines are reviewed at length with the patient today.  Concerns regarding medicines are outlined above.  No orders of the defined types were placed in this encounter.  Meds ordered this encounter  Medications  . isosorbide mononitrate (IMDUR) 30 MG 24 hr tablet    Sig: Take 1 tablet (30 mg total) by mouth daily.    Dispense:  30 tablet    Refill:  11    Patient Instructions  Medication Instructions:  Your physician has recommended you make the following change in your medication:  DECREASE Isosorbide (Imdur) to 30 mg daily  If you need a refill on your cardiac medications before your next appointment, please call your pharmacy.   Lab work: None Ordered   Testing/Procedures: None Ordered    Follow-Up: Your physician recommends that you schedule a follow-up appointment with [facility]   At [facility], you and your health needs  are our priority.  As part of our continuing mission to provide you with exceptional heart care, we have created designated Provider Care Teams.  These Care Teams include your primary Cardiologist (physician) and Advanced Practice Providers (APPs -  Physician Assistants and Nurse Practitioners) who all work together to provide you with the care you need, when you need it. You will need a follow up appointment in:  3 months.   You may see David Moores, MD or one of the following Advanced Practice Providers on your designated Care Team: Richardson Dopp, PA-C [place], [place] . Daune Perch, NP

## 2018-02-01 NOTE — Patient Instructions (Addendum)
Medication Instructions:  Your physician has recommended you make the following change in your medication:  DECREASE Isosorbide (Imdur) to 30 mg daily  If you need a refill on your cardiac medications before your next appointment, please call your pharmacy.   Lab work: None Ordered   Testing/Procedures: None Ordered    Follow-Up: Your physician recommends that you schedule a follow-up appointment with [facility]   At [facility], you and your health needs are our priority.  As part of our continuing mission to provide you with exceptional heart care, we have created designated Provider Care Teams.  These Care Teams include your primary Cardiologist (physician) and Advanced Practice Providers (APPs -  Physician Assistants and Nurse Practitioners) who all work together to provide you with the care you need, when you need it. You will need a follow up appointment in:  3 months.   You may see Mertie Moores, MD or one of the following Advanced Practice Providers on your designated Care Team: Richardson Dopp, PA-C [place], [place] . Daune Perch, NP

## 2018-02-02 ENCOUNTER — Ambulatory Visit (HOSPITAL_COMMUNITY): Payer: 59

## 2018-02-02 ENCOUNTER — Telehealth: Payer: Self-pay | Admitting: Cardiovascular Disease

## 2018-02-02 ENCOUNTER — Ambulatory Visit
Admission: RE | Admit: 2018-02-02 | Discharge: 2018-02-02 | Disposition: A | Discharge status: Home / Self Care | Payer: 59 | Source: Ambulatory Visit | Attending: General Surgery | Admitting: General Surgery

## 2018-02-02 DIAGNOSIS — R1013 Epigastric pain: Secondary | ICD-10-CM

## 2018-02-02 DIAGNOSIS — K5730 Diverticulosis of large intestine without perforation or abscess without bleeding: Secondary | ICD-10-CM | POA: Diagnosis not present

## 2018-02-02 MED ORDER — IOPAMIDOL (ISOVUE-370) INJECTION 76%
100.0000 mL | Freq: Once | INTRAVENOUS | Status: AC | PRN
  Administered 2018-02-02: 100 mL via INTRAVENOUS

## 2018-02-02 NOTE — Telephone Encounter (Signed)
Spoke with the patient, he got the results from Dr. Redmond Pulling and they stated that his CT had some possible answers for his pain. He stated that he discuss with Dr. Acie Fredrickson. The patient wanted to talk with Dr. Acie Fredrickson directly to determine what discussion occurred.

## 2018-02-02 NOTE — Telephone Encounter (Signed)
° ° °  Patient is requesting Dr Acie Fredrickson to call him to discuss results of CT scan. Patient states he has already spoken to Dr Redmond Pulling, but would like to speak with Dr Acie Fredrickson

## 2018-02-05 ENCOUNTER — Encounter: Payer: Self-pay | Admitting: Cardiovascular Disease

## 2018-02-05 ENCOUNTER — Ambulatory Visit (HOSPITAL_COMMUNITY): Payer: 59

## 2018-02-05 NOTE — Telephone Encounter (Signed)
I called David Conner and discussed the results of his CT angio of the abd.   There are no signs of  mesenteric ischemia or other surgical solution to his abdominal pain / chest pain   He has known severe disease of his distal LAD and also a total occlusion of his first diagonal vessel. Tried multiple medications.  We have tried Ranexa but he did not tolerate it.  He is tolerating isosorbide 30 mg a day so far.  When we increased it to 60 mg a day he had several intolerances. We tried him on high-dose rosuvastatin but he also did not tolerate that.  We changed him to atorvastatin but now he now has stopped his atorvastatin lately.  He is seeing our pharmacologist in the lipid clinic to consider PCSK9 inhibitors.  I reviewed the angiograms once again with Dr. Martinique and there are no options for further PCI and none of the lesions are amenable to bypass grafting.  At this point I think that we are left with medical therapy and cholesterol reduction.  I advised him to work on cutting back on his fat intake.  There is some research to suggest that a plant-based diet can help reverse coronary artery disease.  He is to start cardiac rehab tomorrow.  He asked if he could hold his medicines prior to rehab.  I stressed the importance of him taking his medications for rehab.  Ill will be important for him to be on maximal medical therapy so that he can exercise safely.  Will see him in follow up     Mertie Moores, MD  02/05/2018 4:19 PM    [name],  [address] [place], [place]  [address] Pager [phone] Phone: [phone]; Fax: [phone]

## 2018-02-06 ENCOUNTER — Telehealth (HOSPITAL_COMMUNITY): Payer: Self-pay | Admitting: Cardiac Rehabilitation

## 2018-02-06 ENCOUNTER — Telehealth: Payer: Self-pay | Admitting: Pharmacist

## 2018-02-06 ENCOUNTER — Encounter (HOSPITAL_COMMUNITY): Payer: Self-pay

## 2018-02-06 ENCOUNTER — Encounter (HOSPITAL_COMMUNITY)
Admission: RE | Admit: 2018-02-06 | Discharge: 2018-02-06 | Disposition: A | Discharge status: Home / Self Care | Payer: 59 | Source: Ambulatory Visit | Attending: Cardiovascular Disease | Admitting: Cardiovascular Disease

## 2018-02-06 VITALS — BP 92/60 | HR 74 | Ht 70.5 in | Wt 198.9 lb

## 2018-02-06 DIAGNOSIS — I214 Non-ST elevation (NSTEMI) myocardial infarction: Secondary | ICD-10-CM

## 2018-02-06 DIAGNOSIS — Z955 Presence of coronary angioplasty implant and graft: Secondary | ICD-10-CM | POA: Diagnosis not present

## 2018-02-06 MED FILL — ALPRAZolam 0.5 MG TABS: 0.5 | 30 days supply | Qty: 90 | Fill #1

## 2018-02-06 MED FILL — METOPROLOL SUCCINATE ER 100: 100 | 30 days supply | Qty: 60 | Fill #2

## 2018-02-06 MED FILL — ZOLPIDEM TARTRATE 10 MG TAB: 10 | 30 days supply | Qty: 30 | Fill #4

## 2018-02-06 MED FILL — BRILINTA 90 MG TABLET: 90 | 30 days supply | Qty: 60 | Fill #2

## 2018-02-06 NOTE — Telephone Encounter (Signed)
Pt with noted anxiety at cardiac rehab orientation.   Pt accepting of offer to meet with Jeanella Craze for counseling.   appt scheduled 03/07/2018 @ 1pm.  Pt verbalized understanding.   Andi Hence, RN, BSN Cardiac Pulmonary Rehab

## 2018-02-06 NOTE — Telephone Encounter (Signed)
Patient returning call. He is not taking atorvastatin due to intolerability. He has an appointment next Tuesday with pharmacist to discuss PCSK9 inhibitors. Referred back to lipid clinic by Dr. Cathie Olden. Will discuss treatment options with patient at this visit.

## 2018-02-06 NOTE — Progress Notes (Signed)
Cardiac Individual Treatment Plan  Patient Details  Name: David Conner MRN: [id number] Date of Birth: [birth year] Referring Provider:     CARDIAC REHAB PHASE II ORIENTATION from 02/06/2018 in [facility]  Referring Provider  Nahser, Wonda Cheng, MD      Initial Encounter Date:    CARDIAC REHAB PHASE II ORIENTATION from 02/06/2018 in [facility]  Date  02/06/18      Visit Diagnosis: 12/12/2017 Stented coronary artery S/P DES LAD 11/13/17, S/P DES OM 1 and 2 12/12/17  11/11/2017 NSTEMI (non-ST elevated myocardial infarction) [facility])  Patient's Home Medications on Admission:  Current Outpatient Medications:  .  ALPRAZolam (XANAX) 0.5 MG tablet, Take 0.5 mg by mouth 3 (three) times daily as needed for anxiety. , Disp: , Rfl:  .  aspirin EC 81 MG tablet, Take 81 mg by mouth daily. , Disp: , Rfl:  .  atorvastatin (LIPITOR) 10 MG tablet, Take 1 tablet (10 mg total) by mouth daily. (Patient not taking: Reported on 02/01/2018), Disp: 90 tablet, Rfl: 3 .  esomeprazole (NEXIUM) 40 MG capsule, Take 40 mg by mouth daily. , Disp: , Rfl:  .  isosorbide mononitrate (IMDUR) 30 MG 24 hr tablet, Take 1 tablet (30 mg total) by mouth daily., Disp: 30 tablet, Rfl: 11 .  metoprolol succinate (TOPROL-XL) 100 MG 24 hr tablet, Take 1 tablet (100 mg total) by mouth 2 (two) times daily. Take with or immediately following a meal., Disp: 180 tablet, Rfl: 3 .  nitroGLYCERIN (NITROSTAT) 0.3 MG SL tablet, Place 0.3 mg under the tongue every 5 (five) minutes as needed for chest pain. , Disp: , Rfl:  .  polyethylene glycol (MIRALAX / GLYCOLAX) packet, Take 17 g by mouth daily., Disp: , Rfl:  .  Testosterone (ANDROGEL PUMP) 20.25 MG/ACT (1.62%) GEL, Place 40.5 mg onto the skin daily. 2 pumps, Disp: , Rfl:  .  ticagrelor (BRILINTA) 90 MG TABS tablet, Take 1 tablet (90 mg total) by mouth 2 (two) times daily., Disp: 180 tablet, Rfl: 9 .  zolpidem (AMBIEN) 10 MG  tablet, Take 10 mg by mouth at bedtime., Disp: , Rfl: 1  Past Medical History: Past Medical History:  Diagnosis Date  . Allergy   . Complication of anesthesia    upper gi done, had anxiety with versed  . Coronary artery disease    11/13/17:PCI to mid LAD using Resolute Onyx 3.0 x 18 mm DES  . Depression    slight, on sertraline  . ED (erectile dysfunction)   . GERD (gastroesophageal reflux disease)   . Hyperlipidemia   . Nausea occasional  . Restless legs syndrome     Tobacco Use: Social History   Tobacco Use  Smoking Status Never Smoker  Smokeless Tobacco Never Used    Labs: Recent Review Scientist, physiological    Labs for ITP Cardiac and Pulmonary Rehab Latest Ref Rng & Units 11/11/2017   Cholestrol 0 - 200 mg/dL 191   LDLCALC 0 - 99 mg/dL 142(H)   HDL >40 mg/dL 41   Trlycerides <150 mg/dL 38   Hemoglobin A1c 4.8 - 5.6 % 5.3      Capillary Blood Glucose: No results found for: GLUCAP   Exercise Target Goals: Exercise Program Goal: Individual exercise prescription set using results from initial 6 min walk test and THRR while considering  patient's activity barriers and safety.   Exercise Prescription Goal: Initial exercise prescription builds to 30-45 minutes a day of aerobic  activity, 2-3 days per week.  Home exercise guidelines will be given to patient during program as part of exercise prescription that the participant will acknowledge.  Activity Barriers & Risk Stratification: Activity Barriers & Cardiac Risk Stratification - 02/06/18 0924      Activity Barriers & Cardiac Risk Stratification   Activity Barriers  None    Cardiac Risk Stratification  High       6 Minute Walk: 6 Minute Walk    Row Name 02/06/18 0856         6 Minute Walk   Phase  Initial     Distance  1928 feet     Walk Time  6 minutes     # of Rest Breaks  0     MPH  3.65     METS  4.51     RPE  10     Perceived Dyspnea   0     VO2 Peak  15.77     Symptoms  No     Resting HR  74  bpm     Resting BP  92/60     Resting Oxygen Saturation   97 %     Exercise Oxygen Saturation  during 6 min walk  98 %     Max Ex. HR  98 bpm     Max Ex. BP  104/62     2 Minute Post BP  108/70        Oxygen Initial Assessment:   Oxygen Re-Evaluation:   Oxygen Discharge (Final Oxygen Re-Evaluation):   Initial Exercise Prescription: Initial Exercise Prescription - 02/06/18 1100      Date of Initial Exercise RX and Referring Provider   Date  02/06/18    Referring Provider  Nahser, Wonda Cheng, MD    Expected Discharge Date  05/14/18      Treadmill   MPH  2.5    Grade  1    Minutes  10    METs  3.26      Bike   Level  1.5    Minutes  10    METs  4.14      NuStep   Level  3    SPM  85    Minutes  10    METs  3      Prescription Details   Frequency (times per week)  3    Duration  Progress to 30 minutes of continuous aerobic without signs/symptoms of physical distress      Intensity   THRR 40-80% of Max Heartrate  65-131    Ratings of Perceived Exertion  11-13    Perceived Dyspnea  0-4      Progression   Progression  Continue to progress workloads to maintain intensity without signs/symptoms of physical distress.      Resistance Training   Training Prescription  Yes    Weight  4lbs    Reps  10-15       Perform Capillary Blood Glucose checks as needed.  Exercise Prescription Changes:   Exercise Comments:   Exercise Goals and Review:  Exercise Goals    Row Name 02/06/18 1610             Exercise Goals   Increase Physical Activity  Yes       Intervention  Provide advice, education, support and counseling about physical activity/exercise needs.;Develop an individualized exercise prescription for aerobic and resistive training based on initial evaluation findings, risk stratification, comorbidities and participant's  personal goals.       Expected Outcomes  Short Term: Attend rehab on a regular basis to increase amount of physical activity.;Long  Term: Add in home exercise to make exercise part of routine and to increase amount of physical activity.;Long Term: Exercising regularly at least 3-5 days a week.       Increase Strength and Stamina  Yes       Intervention  Provide advice, education, support and counseling about physical activity/exercise needs.;Develop an individualized exercise prescription for aerobic and resistive training based on initial evaluation findings, risk stratification, comorbidities and participant's personal goals.       Expected Outcomes  Short Term: Increase workloads from initial exercise prescription for resistance, speed, and METs.;Short Term: Perform resistance training exercises routinely during rehab and add in resistance training at home;Long Term: Improve cardiorespiratory fitness, muscular endurance and strength as measured by increased METs and functional capacity (6MWT)       Able to understand and use rate of perceived exertion (RPE) scale  Yes       Intervention  Provide education and explanation on how to use RPE scale       Expected Outcomes  Short Term: Able to use RPE daily in rehab to express subjective intensity level;Long Term:  Able to use RPE to guide intensity level when exercising independently       Knowledge and understanding of Target Heart Rate Range (THRR)  Yes       Intervention  Provide education and explanation of THRR including how the numbers were predicted and where they are located for reference       Expected Outcomes  Short Term: Able to state/look up THRR;Long Term: Able to use THRR to govern intensity when exercising independently;Short Term: Able to use daily as guideline for intensity in rehab       Able to check pulse independently  Yes       Intervention  Provide education and demonstration on how to check pulse in carotid and radial arteries.;Review the importance of being able to check your own pulse for safety during independent exercise       Expected Outcomes  Short  Term: Able to explain why pulse checking is important during independent exercise;Long Term: Able to check pulse independently and accurately       Understanding of Exercise Prescription  Yes       Intervention  Provide education, explanation, and written materials on patient's individual exercise prescription       Expected Outcomes  Short Term: Able to explain program exercise prescription;Long Term: Able to explain home exercise prescription to exercise independently          Exercise Goals Re-Evaluation :   Discharge Exercise Prescription (Final Exercise Prescription Changes):   Nutrition:  Target Goals: Understanding of nutrition guidelines, daily intake of sodium 1500mg , cholesterol 200mg , calories 30% from fat and 7% or less from saturated fats, daily to have 5 or more servings of fruits and vegetables.  Biometrics: Pre Biometrics - 02/06/18 0804      Pre Biometrics   Height  5' 10.5" (1.791 m)    Weight  90.2 kg    Waist Circumference  36.75 inches    Hip Circumference  43.75 inches    Waist to Hip Ratio  0.84 %    BMI (Calculated)  28.12    Triceps Skinfold  7 mm    % Body Fat  22.5 %    Grip Strength  51 kg  Flexibility  13.5 in    Single Leg Stand  21.18 seconds        Nutrition Therapy Plan and Nutrition Goals:   Nutrition Assessments:   Nutrition Goals Re-Evaluation:   Nutrition Goals Re-Evaluation:   Nutrition Goals Discharge (Final Nutrition Goals Re-Evaluation):   Psychosocial: Target Goals: Acknowledge presence or absence of significant depression and/or stress, maximize coping skills, provide positive support system. Participant is able to verbalize types and ability to use techniques and skills needed for reducing stress and depression.  Initial Review & Psychosocial Screening: Initial Psych Review & Screening - 02/06/18 1205      Initial Review   Current issues with  Current Depression;Current Anxiety/Panic      Family Dynamics    Good Support System?  Yes    Comments  Rumaldo says he has recently had a lot of fear, anxiety and worry and has an appointmenmt to meet a psychiatrist on 02/16/18      Barriers   Psychosocial barriers to participate in program  The patient should benefit from training in stress management and relaxation.      Screening Interventions   Interventions  Encouraged to exercise;To provide support and resources with identified psychosocial needs;Provide feedback about the scores to participant   Jaxxson has an appoinment to meet with the hospital chaplain 02/07/18   Expected Outcomes  Short Term goal: Utilizing psychosocial counselor, staff and physician to assist with identification of specific Stressors or current issues interfering with healing process. Setting desired goal for each stressor or current issue identified.;Long Term Goal: Stressors or current issues are controlled or eliminated.;Short Term goal: Identification and review with participant of any Quality of Life or Depression concerns found by scoring the questionnaire.;Long Term goal: The participant improves quality of Life and PHQ9 Scores as seen by post scores and/or verbalization of changes       Quality of Life Scores:  Scores of 19 and below usually indicate a poorer quality of life in these areas.  A difference of  2-3 points is a clinically meaningful difference.  A difference of 2-3 points in the total score of the Quality of Life Index has been associated with significant improvement in overall quality of life, self-image, physical symptoms, and general health in studies assessing change in quality of life.  PHQ-9: Recent Review Flowsheet Data    There is no flowsheet data to display.     Interpretation of Total Score  Total Score Depression Severity:  1-4 = Minimal depression, 5-9 = Mild depression, 10-14 = Moderate depression, 15-19 = Moderately severe depression, 20-27 = Severe depression   Psychosocial Evaluation and  Intervention:   Psychosocial Re-Evaluation:   Psychosocial Discharge (Final Psychosocial Re-Evaluation):   Vocational Rehabilitation: Provide vocational rehab assistance to qualifying candidates.   Vocational Rehab Evaluation & Intervention: Vocational Rehab - 02/06/18 1218      Initial Vocational Rehab Evaluation & Intervention   Assessment shows need for Vocational Rehabilitation  No   Briceson will be able to return to his job when his is able to do so and does not need vocatonal rehab at this time      Education: Education Goals: Education classes will be provided on a weekly basis, covering required topics. Participant will state understanding/return demonstration of topics presented.  Learning Barriers/Preferences: Learning Barriers/Preferences - 02/06/18 1216      Learning Barriers/Preferences   Learning Barriers  Sight   wears glasses   Learning Preferences  Audio;Skilled Demonstration;Verbal Instruction;Pictoral;Individual Instruction  Education Topics: Count Your Pulse:  -Group instruction provided by verbal instruction, demonstration, patient participation and written materials to support subject.  Instructors address importance of being able to find your pulse and how to count your pulse when at home without a heart monitor.  Patients get hands on experience counting their pulse with staff help and individually.   Heart Attack, Angina, and Risk Factor Modification:  -Group instruction provided by verbal instruction, video, and written materials to support subject.  Instructors address signs and symptoms of angina and heart attacks.    Also discuss risk factors for heart disease and how to make changes to improve heart health risk factors.   Functional Fitness:  -Group instruction provided by verbal instruction, demonstration, patient participation, and written materials to support subject.  Instructors address safety measures for doing things around the  house.  Discuss how to get up and down off the floor, how to pick things up properly, how to safely get out of a chair without assistance, and balance training.   Meditation and Mindfulness:  -Group instruction provided by verbal instruction, patient participation, and written materials to support subject.  Instructor addresses importance of mindfulness and meditation practice to help reduce stress and improve awareness.  Instructor also leads participants through a meditation exercise.    Stretching for Flexibility and Mobility:  -Group instruction provided by verbal instruction, patient participation, and written materials to support subject.  Instructors lead participants through series of stretches that are designed to increase flexibility thus improving mobility.  These stretches are additional exercise for major muscle groups that are typically performed during regular warm up and cool down.   Hands Only CPR:  -Group verbal, video, and participation provides a basic overview of AHA guidelines for community CPR. Role-play of emergencies allow participants the opportunity to practice calling for help and chest compression technique with discussion of AED use.   Hypertension: -Group verbal and written instruction that provides a basic overview of hypertension including the most recent diagnostic guidelines, risk factor reduction with self-care instructions and medication management.    Nutrition I class: Heart Healthy Eating:  -Group instruction provided by PowerPoint slides, verbal discussion, and written materials to support subject matter. The instructor gives an explanation and review of the Therapeutic Lifestyle Changes diet recommendations, which includes a discussion on lipid goals, dietary fat, sodium, fiber, plant stanol/sterol esters, sugar, and the components of a well-balanced, healthy diet.   Nutrition II class: Lifestyle Skills:  -Group instruction provided by PowerPoint  slides, verbal discussion, and written materials to support subject matter. The instructor gives an explanation and review of label reading, grocery shopping for heart health, heart healthy recipe modifications, and ways to make healthier choices when eating out.   Diabetes Question & Answer:  -Group instruction provided by PowerPoint slides, verbal discussion, and written materials to support subject matter. The instructor gives an explanation and review of diabetes co-morbidities, pre- and post-prandial blood glucose goals, pre-exercise blood glucose goals, signs, symptoms, and treatment of hypoglycemia and hyperglycemia, and foot care basics.   Diabetes Blitz:  -Group instruction provided by PowerPoint slides, verbal discussion, and written materials to support subject matter. The instructor gives an explanation and review of the physiology behind type 1 and type 2 diabetes, diabetes medications and rational behind using different medications, pre- and post-prandial blood glucose recommendations and Hemoglobin A1c goals, diabetes diet, and exercise including blood glucose guidelines for exercising safely.    Portion Distortion:  -Group instruction provided by PowerPoint slides,  verbal discussion, written materials, and food models to support subject matter. The instructor gives an explanation of serving size versus portion size, changes in portions sizes over the last 20 years, and what consists of a serving from each food group.   Stress Management:  -Group instruction provided by verbal instruction, video, and written materials to support subject matter.  Instructors review role of stress in heart disease and how to cope with stress positively.     Exercising on Your Own:  -Group instruction provided by verbal instruction, power point, and written materials to support subject.  Instructors discuss benefits of exercise, components of exercise, frequency and intensity of exercise, and end  points for exercise.  Also discuss use of nitroglycerin and activating EMS.  Review options of places to exercise outside of rehab.  Review guidelines for sex with heart disease.   Cardiac Drugs I:  -Group instruction provided by verbal instruction and written materials to support subject.  Instructor reviews cardiac drug classes: antiplatelets, anticoagulants, beta blockers, and statins.  Instructor discusses reasons, side effects, and lifestyle considerations for each drug class.   Cardiac Drugs II:  -Group instruction provided by verbal instruction and written materials to support subject.  Instructor reviews cardiac drug classes: angiotensin converting enzyme inhibitors (ACE-I), angiotensin II receptor blockers (ARBs), nitrates, and calcium channel blockers.  Instructor discusses reasons, side effects, and lifestyle considerations for each drug class.   Anatomy and Physiology of the Circulatory System:  Group verbal and written instruction and models provide basic cardiac anatomy and physiology, with the coronary electrical and arterial systems. Review of: AMI, Angina, Valve disease, Heart Failure, Peripheral Artery Disease, Cardiac Arrhythmia, Pacemakers, and the ICD.   Other Education:  -Group or individual verbal, written, or video instructions that support the educational goals of the cardiac rehab program.   Holiday Eating Survival Tips:  -Group instruction provided by PowerPoint slides, verbal discussion, and written materials to support subject matter. The instructor gives patients tips, tricks, and techniques to help them not only survive but enjoy the holidays despite the onslaught of food that accompanies the holidays.   Knowledge Questionnaire Score: Knowledge Questionnaire Score - 02/06/18 1134      Knowledge Questionnaire Score   Pre Score  24/24       Core Components/Risk Factors/Patient Goals at Admission: Personal Goals and Risk Factors at Admission - 02/06/18 1219       Core Components/Risk Factors/Patient Goals on Admission    Weight Management  Yes    Intervention  Weight Management: Develop a combined nutrition and exercise program designed to reach desired caloric intake, while maintaining appropriate intake of nutrient and fiber, sodium and fats, and appropriate energy expenditure required for the weight goal.;Weight Management: Provide education and appropriate resources to help participant work on and attain dietary goals.    Admit Weight  198 lb 13.7 oz (90.2 kg)    Lipids  Yes    Intervention  Provide education and support for participant on nutrition & aerobic/resistive exercise along with prescribed medications to achieve LDL 70mg , HDL >40mg .    Expected Outcomes  Short Term: Participant states understanding of desired cholesterol values and is compliant with medications prescribed. Participant is following exercise prescription and nutrition guidelines.;Long Term: Cholesterol controlled with medications as prescribed, with individualized exercise RX and with personalized nutrition plan. Value goals: LDL < 70mg , HDL > 40 mg.    Stress  Yes    Intervention  Offer individual and/or small group education and counseling on adjustment  to heart disease, stress management and health-related lifestyle change. Teach and support self-help strategies.;Refer participants experiencing significant psychosocial distress to appropriate mental health specialists for further evaluation and treatment. When possible, include family members and significant others in education/counseling sessions.    Expected Outcomes  Short Term: Participant demonstrates changes in health-related behavior, relaxation and other stress management skills, ability to obtain effective social support, and compliance with psychotropic medications if prescribed.;Long Term: Emotional wellbeing is indicated by absence of clinically significant psychosocial distress or social isolation.    Personal  Goal Other  Yes    Personal Goal  Be able to exercise without angina.    Intervention  Develop individualized exercise prescription including stretching, aerobic exercise, and resistance training at a level that does not cause angina.     Expected Outcomes  Patient will exercise at level below anginal threshold.       Core Components/Risk Factors/Patient Goals Review:    Core Components/Risk Factors/Patient Goals at Discharge (Final Review):    ITP Comments: ITP Comments    Row Name 02/06/18 0851           ITP Comments  Dr.Traci Radford Pax, Medical Director           Comments: Marguerite Olea attended orientation from [phone] to 0935 to review rules and guidelines for program. Completed 6 minute walk test, Intitial ITP, and exercise prescription.  VSS. Telemetry-Sinus Rhythm.  Asymptomatic. Wayland has experienced a lot of anxiety since his MI and St[place]. Appointment set up for [place] to meet with the hospital chaplain on tomorrow. Zebastian also has an appointment to see a psychiatrist on 02/15/17.Will continue to monitor the patient throughout  the program and provide emotional support as needed.Barnet Pall, RN,BSN 02/06/2018 12:36 PM

## 2018-02-06 NOTE — Telephone Encounter (Signed)
Calling to follow up on atorvastatin tolerability. It appears this was marked as not taking recently. Will need to discuss options if not taking or schedule for f/u labs as pt wanted to see effect before dose increase if taking.   LMOM to discuss above.

## 2018-02-07 ENCOUNTER — Ambulatory Visit (HOSPITAL_COMMUNITY): Payer: 59

## 2018-02-08 MED FILL — ISOSORBIDE MN ER 30 MG TAB: 30 | 30 days supply | Qty: 30 | Fill #3

## 2018-02-09 ENCOUNTER — Ambulatory Visit (HOSPITAL_COMMUNITY): Payer: 59

## 2018-02-12 ENCOUNTER — Ambulatory Visit (HOSPITAL_COMMUNITY): Payer: 59

## 2018-02-12 ENCOUNTER — Other Ambulatory Visit (HOSPITAL_COMMUNITY): Payer: 59

## 2018-02-12 ENCOUNTER — Encounter (HOSPITAL_COMMUNITY)
Admission: RE | Admit: 2018-02-12 | Discharge: 2018-02-12 | Disposition: A | Discharge status: Home / Self Care | Payer: 59 | Source: Ambulatory Visit | Attending: Cardiovascular Disease | Admitting: Cardiovascular Disease

## 2018-02-12 DIAGNOSIS — I214 Non-ST elevation (NSTEMI) myocardial infarction: Secondary | ICD-10-CM

## 2018-02-12 DIAGNOSIS — Z955 Presence of coronary angioplasty implant and graft: Secondary | ICD-10-CM

## 2018-02-12 NOTE — Progress Notes (Signed)
Daily Session Note  Patient Details  Name: David Conner MRN: [id number] Date of Birth: 07/27/1960 Referring Provider:     CARDIAC REHAB PHASE II ORIENTATION from 02/06/2018 in [place]  Referring Provider  Nahser, Wonda Cheng, MD      Encounter Date: 02/12/2018  Check In: Session Check In - 02/12/18 1459      Check-In   Supervising physician immediately available to respond to emergencies  [facility] immediately available    Physician(s)  Dr. Ree Kida    Location  [facility]    Staff Present  Barnet Pall, RN, Mosie Epstein, MS,ACSM CEP, Exercise Physiologist;Olinty Celesta Aver, MS, ACSM CEP, Exercise Physiologist    Medication changes reported      No    Fall or balance concerns reported     No    Tobacco Cessation  No Change    Warm-up and Cool-down  Performed as group-led instruction    Resistance Training Performed  Yes    VAD Patient?  No    PAD/SET Patient?  No      Pain Assessment   Currently in Pain?  No/denies    Multiple Pain Sites  No       Capillary Blood Glucose: No results found for this or any previous visit (from the past 24 hour(s)).    Social History   Tobacco Use  Smoking Status Never Smoker  Smokeless Tobacco Never Used    Goals Met:  Exercise tolerated well  Goals Unmet:  Not Applicable  Comments: [name] started cardiac rehab today.  Pt tolerated light exercise without difficulty. VSS, telemetry-Sinus Rhtyhm, asymptomatic.  Medication list reconciled. Pt denies barriers to medicaiton compliance.  PSYCHOSOCIAL ASSESSMENT:  PHQ-6. David Conner admits to being depressed. David Conner is currently taking alprazolam 3 times a day and started seeing a counselor this past Wednesday and see's the psychiatrist for the first time on this Thursday   Pt enjoys going to [place] with his wife  .   Pt oriented to exercise equipment and routine.    Understanding verbalized.   Dr. Fransico Him is Medical  Director for Cardiac Rehab at [facility]

## 2018-02-13 ENCOUNTER — Ambulatory Visit (INDEPENDENT_AMBULATORY_CARE_PROVIDER_SITE_OTHER): Payer: 59 | Admitting: Pharmacist

## 2018-02-13 DIAGNOSIS — E782 Mixed hyperlipidemia: Secondary | ICD-10-CM

## 2018-02-13 NOTE — Progress Notes (Signed)
Patient ID: David Conner                 DOB: 1960-06-06                    MRN: [id number]     HPI: David Conner is a 58 y.o. male patient referred to lipid clinic by Dr Acie Fredrickson. PMH is significant for NSTEMI in October 2019 with multivessel CAD including 99% mid LAD occlusion that was treated with PCI. He also had a chronic total occlusion of a moderate-sized diagonal vessel and moderate to severe disease in his distal right coronary artery and obtuse marginal artery. Upon discharge in October, patient was prescribed atorvastatin 80 mg daily. At follow up with Dr Acie Fredrickson, atorvastatin was changed to rosuvastatin 40 mg daily by Dr. Acie Fredrickson for more potent LDL lowering. Pt then presented back to the ER 11 days later due to chest pain. He underwent another cath that showed 3 vessel obstructive CAD, PCI s/p DES to first and second OM. Residual disease in the LAD, diagonal, and RCA were not amenable to PCI or CABG.  In December 2019, during lipid clinic visit with Tana Coast, PharmD, patient reported that he never took a dose of atorvastatin 80 mg daily. He also had stopped taking rosuvastatin 40 mg daily due to muscle aches. Patient was started on atorvastatin 10 mg daily. However, patient continued to experience muscle aches along with extreme anxiety, fatigue, nausea and loss of appetite (lost 30 pounds since October 2019) and was instructed to discontinue atorvastatin 10 mg 3 weeks ago. Anxiety more likely related to CAD diagnosis and chest pain. Patient presents today to discuss PCSK9i therapy.   Patient reports myalgias to rosuvastatin 40 mg daily and atorvastatin 10 mg daily. Symptom onset occurred within a few days and took a few weeks to disappear. Patient understands the importance of lowering LDL. Patient does not want to try another statin therapy and is interested in PCSK9i. Patient's diet is low in fat and patient started cardiac rehab and walking more often. Patient also reports that he  will be seeing a psychiatrist on Thursday and a therapist as well as he thinks that controlling his anxiety has been helping his nausea and loss of appetite.   Current Medications: None  Intolerances: rosuvastatin 40 mg daily - myalgias, atorvastatin 10 mg daily - myalgias  Risk Factors: CAD s/p NSTEMI and multiple DES, family hx of CAD  LDL goal: <70 mg/dL  Diet:  B: toast, biscuits, sausage L&D: grilled chicken, mashed potatoes, pinto beans  Exercise: started cardiac rehab (first date: 02/12/18), walking  Family History: Father with bladder cancer, diabetes, HTN. Mother with CAD, HLD, and HTN. Maternal grandfather with CAD  Social History: denies alcohol  Labs: 11/11/17: TC 191, TG 38, HDL 41, LDL 142 (no therapy)  Past Medical History:  Diagnosis Date  . Allergy   . Complication of anesthesia    upper gi done, had anxiety with versed  . Coronary artery disease    11/13/17:PCI to mid LAD using Resolute Onyx 3.0 x 18 mm DES  . Depression    slight, on sertraline  . ED (erectile dysfunction)   . GERD (gastroesophageal reflux disease)   . Hyperlipidemia   . Nausea occasional  . Restless legs syndrome     Current Outpatient Medications on File Prior to Visit  Medication Sig Dispense Refill  . ALPRAZolam (XANAX) 0.5 MG tablet Take 0.5 mg by mouth 3 (three) times daily.     [name]  aspirin EC 81 MG tablet Take 81 mg by mouth daily.     [name] atorvastatin (LIPITOR) 10 MG tablet Take 1 tablet (10 mg total) by mouth daily. (Patient not taking: Reported on 02/01/2018) 90 tablet 3  . esomeprazole (NEXIUM) 40 MG capsule Take 40 mg by mouth daily.     . isosorbide mononitrate (IMDUR) 30 MG 24 hr tablet Take 1 tablet (30 mg total) by mouth daily. (Patient taking differently: Take 60 mg by mouth daily. ) 30 tablet 11  . metoprolol succinate (TOPROL-XL) 100 MG 24 hr tablet Take 1 tablet (100 mg total) by mouth 2 (two) times daily. Take with or immediately following a meal. 180 tablet 3  .  nitroGLYCERIN (NITROSTAT) 0.3 MG SL tablet Place 0.3 mg under the tongue every 5 (five) minutes as needed for chest pain.     . polyethylene glycol (MIRALAX / GLYCOLAX) packet Take 17 g by mouth daily.    . Testosterone (ANDROGEL PUMP) 20.25 MG/ACT (1.62%) GEL Place 40.5 mg onto the skin daily. 2 pumps    . ticagrelor (BRILINTA) 90 MG TABS tablet Take 1 tablet (90 mg total) by mouth 2 (two) times daily. 180 tablet 9  . zolpidem (AMBIEN) 10 MG tablet Take 10 mg by mouth at bedtime.  1   No current facility-administered medications on file prior to visit.     Allergies  Allergen Reactions  . Lansoprazole Other (See Comments)    Dizziness   . Ofloxacin Other (See Comments)    insomnia  . Penicillin G Hives and Other (See Comments)    Childhood allergic reaction Has patient had a PCN reaction causing immediate rash, facial/tongue/throat swelling, SOB or lightheadedness with hypotension: Yes Has patient had a PCN reaction causing severe rash involving mucus membranes or skin necrosis: No Has patient had a PCN reaction that required hospitalization: No Has patient had a PCN reaction occurring within the last 10 years: No If all of the above answers are "NO", then may proceed with Cephalosporin use.  . Versed [Midazolam] Anxiety    Pt reports that he had anxiety 24hr post procedure. Versed was given during previous catherization without adverse affects.     Assessment/Plan:  1. Hyperlipidemia - LDL above goal <70 mg/dL due to history of ASCVD. Patient has a history of statin intolerances to rosuvastatin 40 mg daily and atorvastatin 10 mg daily (myalgias with each). Will initiate Repatha and submit PA. Printed out his $5 co-pay in clinic. Demonstrated proper injection technique. Will call patient once PA is approved. Patient will most likely call clinic to inject the first shot in clinic. Will follow up lipid panel after 3-4 shots.   Patient seen in clinic by Almira Bar, PharmD  Candidate  Hasson Gaspard E. Tashema Tiller, PharmD, BCACP, [facility] [address] [place], [place] [address] Phone: [phone]; Fax: [phone] 02/13/2018 3:17 PM

## 2018-02-14 ENCOUNTER — Ambulatory Visit (HOSPITAL_COMMUNITY): Payer: 59

## 2018-02-14 ENCOUNTER — Encounter (HOSPITAL_COMMUNITY)
Admission: RE | Admit: 2018-02-14 | Discharge: 2018-02-14 | Disposition: A | Discharge status: Home / Self Care | Payer: 59 | Source: Ambulatory Visit | Attending: Cardiovascular Disease | Admitting: Cardiovascular Disease

## 2018-02-14 DIAGNOSIS — Z955 Presence of coronary angioplasty implant and graft: Secondary | ICD-10-CM

## 2018-02-14 DIAGNOSIS — I214 Non-ST elevation (NSTEMI) myocardial infarction: Secondary | ICD-10-CM

## 2018-02-15 MED FILL — ESCITALOPRAM 5 MG TABLET: 5 | 30 days supply | Qty: 30 | Fill #0

## 2018-02-15 NOTE — Progress Notes (Signed)
QUALITY OF LIFE SCORE REVIEW  Pt completed Quality of Life survey as a participant in Cardiac Rehab. Scores 21.0 or below are considered low. Pt score very low in several areas Overall 20.01, Health and Function 18.7, socioeconomic 22.86, physiological and spiritual 13.21, family 29.5. Patient quality of life slightly altered by physical constraints which limits ability to perform as prior to recent cardiac illness. David Conner is going to see the psychiatrist on 02/15/18. David Conner was given a copy of his quality to take with him to his appointment .  Offered emotional support and reassurance.  Will continue to monitor and intervene as necessary.  Harrell Gave RN BSN

## 2018-02-16 ENCOUNTER — Ambulatory Visit (HOSPITAL_COMMUNITY): Payer: 59

## 2018-02-16 ENCOUNTER — Telehealth: Payer: Self-pay | Admitting: Pharmacist

## 2018-02-16 ENCOUNTER — Encounter (HOSPITAL_COMMUNITY)
Admission: RE | Admit: 2018-02-16 | Discharge: 2018-02-16 | Disposition: A | Discharge status: Home / Self Care | Payer: 59 | Source: Ambulatory Visit | Attending: Cardiovascular Disease | Admitting: Cardiovascular Disease

## 2018-02-16 DIAGNOSIS — Z955 Presence of coronary angioplasty implant and graft: Secondary | ICD-10-CM | POA: Diagnosis not present

## 2018-02-16 DIAGNOSIS — I214 Non-ST elevation (NSTEMI) myocardial infarction: Secondary | ICD-10-CM

## 2018-02-16 MED ORDER — EVOLOCUMAB 140 MG/ML ~~LOC~~ SOAJ: 1.0000 "pen " | SUBCUTANEOUS | 11 refills | Status: DC

## 2018-02-16 MED FILL — REPATHA SURECLICK 140 MG/ML: 140 | 28 days supply | Qty: 2 | Fill #0

## 2018-02-16 NOTE — Progress Notes (Signed)
David Conner says he was started on 5 mg of lexapro once a day for 30 days. Will add to medication list.David Sonntag [name], RN,BSN 02/16/2018 3:51 PM

## 2018-02-16 NOTE — Addendum Note (Signed)
Addended by: Marcelle Overlie D on: 02/16/2018 10:38 AM   Modules accepted: Orders

## 2018-02-16 NOTE — Telephone Encounter (Signed)
Patient called to let us know he was started on lexapro 5mg  by his physiatrist. Wants to make sure there isn't any issues with his other medications. Patient also concerned it is making him naueous. I informed him that the lexapro is ok with his other medications and that sometimes with these types of medications we can feel worse before we get better. Encouraged him to give it time 3-4 weeks.   Also let patient know that his Repatha PA was approved through 08/16/2018 and his copay is $35 but if he brings the coapy card we gave him it should come down to $5. Patient asked about first dose injection. Told him that if he reads the directions and feels comfortable doing it himself that is ok, if not he can call back and we can set up a time to do his first injection in clinic

## 2018-02-19 ENCOUNTER — Ambulatory Visit (HOSPITAL_COMMUNITY): Payer: 59

## 2018-02-19 ENCOUNTER — Encounter (HOSPITAL_COMMUNITY)
Admission: RE | Admit: 2018-02-19 | Discharge: 2018-02-19 | Disposition: A | Discharge status: Home / Self Care | Payer: 59 | Source: Ambulatory Visit | Attending: Cardiovascular Disease | Admitting: Cardiovascular Disease

## 2018-02-19 DIAGNOSIS — Z955 Presence of coronary angioplasty implant and graft: Secondary | ICD-10-CM | POA: Diagnosis not present

## 2018-02-19 DIAGNOSIS — I214 Non-ST elevation (NSTEMI) myocardial infarction: Secondary | ICD-10-CM | POA: Diagnosis not present

## 2018-02-19 NOTE — Progress Notes (Signed)
Reviewed home exercise guidelines with patient including endpoints, temperature precautions, target heart rate and rate of perceived exertion. Pt is walking 20-30 minutes, 2-3 days/week on his treadmill as his mode of home exercise. Pt voices understanding of instructions given. Sol Passer, MS, ACSM CEP

## 2018-02-21 ENCOUNTER — Ambulatory Visit (HOSPITAL_COMMUNITY): Payer: 59

## 2018-02-21 ENCOUNTER — Encounter (HOSPITAL_COMMUNITY)
Admission: RE | Admit: 2018-02-21 | Discharge: 2018-02-21 | Disposition: A | Discharge status: Home / Self Care | Payer: 59 | Source: Ambulatory Visit | Attending: Cardiovascular Disease | Admitting: Cardiovascular Disease

## 2018-02-21 DIAGNOSIS — Z955 Presence of coronary angioplasty implant and graft: Secondary | ICD-10-CM | POA: Diagnosis not present

## 2018-02-21 DIAGNOSIS — I214 Non-ST elevation (NSTEMI) myocardial infarction: Secondary | ICD-10-CM

## 2018-02-22 ENCOUNTER — Other Ambulatory Visit: Payer: Self-pay | Admitting: Cardiovascular Disease

## 2018-02-22 MED ORDER — NITROGLYCERIN 0.3 MG SL SUBL: 0.3000 mg | SUBLINGUAL_TABLET | SUBLINGUAL | 2 refills | Status: DC | PRN

## 2018-02-22 MED ORDER — NITROGLYCERIN 0.3 MG SL SUBL: 0.3000 mg | SUBLINGUAL_TABLET | SUBLINGUAL | 1 refills | Status: DC | PRN

## 2018-02-22 MED FILL — NITROGLYCERIN 0.3 MG TAB SL: 0.3 | 10 days supply | Qty: 100 | Fill #0

## 2018-02-22 NOTE — Addendum Note (Signed)
Addended by: Derl Barrow on: 02/22/2018 12:30 PM   Modules accepted: Orders

## 2018-02-22 NOTE — Progress Notes (Signed)
Cardiac Individual Treatment Plan  Patient Details  Name: David Conner MRN: [id number] Date of Birth: [birth year] Referring Provider:     CARDIAC REHAB PHASE II ORIENTATION from 02/06/2018 in [place]  Referring Provider  Nahser, Wonda Cheng, MD      Initial Encounter Date:    CARDIAC REHAB PHASE II ORIENTATION from 02/06/2018 in [place]  Date  02/06/18      Visit Diagnosis: 12/12/2017 Stented coronary artery S/P DES LAD 11/13/17, S/P DES OM 1 and 2 12/12/17  11/11/2017 NSTEMI (non-ST elevated myocardial infarction) [facility])  Patient's Home Medications on Admission:  Current Outpatient Medications:  .  ALPRAZolam (XANAX) 0.5 MG tablet, Take 0.5 mg by mouth 3 (three) times daily. , Disp: , Rfl:  .  aspirin EC 81 MG tablet, Take 81 mg by mouth daily. , Disp: , Rfl:  .  atorvastatin (LIPITOR) 10 MG tablet, Take 1 tablet (10 mg total) by mouth daily. (Patient not taking: Reported on 02/01/2018), Disp: 90 tablet, Rfl: 3 .  escitalopram (LEXAPRO) 5 MG tablet, Take 5 mg by mouth daily., Disp: , Rfl:  .  esomeprazole (NEXIUM) 40 MG capsule, Take 40 mg by mouth daily. , Disp: , Rfl:  .  Evolocumab (REPATHA SURECLICK) 409 MG/ML SOAJ, Inject 1 pen into the skin every 14 (fourteen) days., Disp: 2 pen, Rfl: 11 .  isosorbide mononitrate (IMDUR) 30 MG 24 hr tablet, Take 1 tablet (30 mg total) by mouth daily. (Patient taking differently: Take 60 mg by mouth daily. ), Disp: 30 tablet, Rfl: 11 .  metoprolol succinate (TOPROL-XL) 100 MG 24 hr tablet, Take 1 tablet (100 mg total) by mouth 2 (two) times daily. Take with or immediately following a meal., Disp: 180 tablet, Rfl: 3 .  nitroGLYCERIN (NITROSTAT) 0.3 MG SL tablet, Place 1 tablet (0.3 mg total) under the tongue every 5 (five) minutes as needed for chest pain., Disp: 100 tablet, Rfl: 1 .  polyethylene glycol (MIRALAX / GLYCOLAX) packet, Take 17 g by mouth daily., Disp: , Rfl:  .   Testosterone (ANDROGEL PUMP) 20.25 MG/ACT (1.62%) GEL, Place 40.5 mg onto the skin daily. 2 pumps, Disp: , Rfl:  .  ticagrelor (BRILINTA) 90 MG TABS tablet, Take 1 tablet (90 mg total) by mouth 2 (two) times daily., Disp: 180 tablet, Rfl: 9 .  zolpidem (AMBIEN) 10 MG tablet, Take 10 mg by mouth at bedtime., Disp: , Rfl: 1  Past Medical History: Past Medical History:  Diagnosis Date  . Allergy   . Complication of anesthesia    upper gi done, had anxiety with versed  . Coronary artery disease    11/13/17:PCI to mid LAD using Resolute Onyx 3.0 x 18 mm DES  . Depression    slight, on sertraline  . ED (erectile dysfunction)   . GERD (gastroesophageal reflux disease)   . Hyperlipidemia   . Nausea occasional  . Restless legs syndrome     Tobacco Use: Social History   Tobacco Use  Smoking Status Never Smoker  Smokeless Tobacco Never Used    Labs: Recent Review Scientist, physiological    Labs for ITP Cardiac and Pulmonary Rehab Latest Ref Rng & Units 11/11/2017   Cholestrol 0 - 200 mg/dL 191   LDLCALC 0 - 99 mg/dL 142(H)   HDL >40 mg/dL 41   Trlycerides <150 mg/dL 38   Hemoglobin A1c 4.8 - 5.6 % 5.3      Capillary Blood Glucose: No results  found for: GLUCAP   Exercise Target Goals: Exercise Program Goal: Individual exercise prescription set using results from initial 6 min walk test and THRR while considering  patient's activity barriers and safety.   Exercise Prescription Goal: Initial exercise prescription builds to 30-45 minutes a day of aerobic activity, 2-3 days per week.  Home exercise guidelines will be given to patient during program as part of exercise prescription that the participant will acknowledge.  Activity Barriers & Risk Stratification: Activity Barriers & Cardiac Risk Stratification - 02/06/18 0924      Activity Barriers & Cardiac Risk Stratification   Activity Barriers  None    Cardiac Risk Stratification  High       6 Minute Walk: 6 Minute Walk    Row  Name 02/06/18 0856         6 Minute Walk   Phase  Initial     Distance  1928 feet     Walk Time  6 minutes     # of Rest Breaks  0     MPH  3.65     METS  4.51     RPE  10     Perceived Dyspnea   0     VO2 Peak  15.77     Symptoms  No     Resting HR  74 bpm     Resting BP  92/60     Resting Oxygen Saturation   97 %     Exercise Oxygen Saturation  during 6 min walk  98 %     Max Ex. HR  98 bpm     Max Ex. BP  104/62     2 Minute Post BP  108/70        Oxygen Initial Assessment:   Oxygen Re-Evaluation:   Oxygen Discharge (Final Oxygen Re-Evaluation):   Initial Exercise Prescription: Initial Exercise Prescription - 02/06/18 1100      Date of Initial Exercise RX and Referring Provider   Date  02/06/18    Referring Provider  Nahser, Wonda Cheng, MD    Expected Discharge Date  05/14/18      Treadmill   MPH  2.5    Grade  1    Minutes  10    METs  3.26      Bike   Level  1.5    Minutes  10    METs  4.14      NuStep   Level  3    SPM  85    Minutes  10    METs  3      Prescription Details   Frequency (times per week)  3    Duration  Progress to 30 minutes of continuous aerobic without signs/symptoms of physical distress      Intensity   THRR 40-80% of Max Heartrate  65-131    Ratings of Perceived Exertion  11-13    Perceived Dyspnea  0-4      Progression   Progression  Continue to progress workloads to maintain intensity without signs/symptoms of physical distress.      Resistance Training   Training Prescription  Yes    Weight  4lbs    Reps  10-15       Perform Capillary Blood Glucose checks as needed.  Exercise Prescription Changes: Exercise Prescription Changes    Row Name 02/12/18 1449             Response to Exercise   Blood Pressure (  Admit)  118/72       Blood Pressure (Exercise)  138/82       Blood Pressure (Exit)  98/70       Heart Rate (Admit)  93 bpm       Heart Rate (Exercise)  119 bpm       Heart Rate (Exit)  90 bpm        Rating of Perceived Exertion (Exercise)  10       Symptoms  none       Duration  Progress to 30 minutes of  aerobic without signs/symptoms of physical distress       Intensity  THRR unchanged         Progression   Progression  Continue to progress workloads to maintain intensity without signs/symptoms of physical distress.       Average METs  3.4         Resistance Training   Training Prescription  Yes       Weight  4lbs       Reps  10-15       Time  10 Minutes         Interval Training   Interval Training  No         Treadmill   MPH  3       Grade  1       Minutes  10       METs  3.71         Bike   Level  1.5       Minutes  10       METs  4.17         NuStep   Level  3       SPM  85       Minutes  10       METs  2.3          Exercise Comments: Exercise Comments    Row Name 02/12/18 1548 02/19/18 1534         Exercise Comments  Patient tolerated first session of exercise well without symptoms.  Reviewed home exercise guidelines, METs, and goals with patient.         Exercise Goals and Review: Exercise Goals    Row Name 02/06/18 [phone]             Exercise Goals   Increase Physical Activity  Yes       Intervention  Provide advice, education, support and counseling about physical activity/exercise needs.;Develop an individualized exercise prescription for aerobic and resistive training based on initial evaluation findings, risk stratification, comorbidities and participant's personal goals.       Expected Outcomes  Short Term: Attend rehab on a regular basis to increase amount of physical activity.;Long Term: Add in home exercise to make exercise part of routine and to increase amount of physical activity.;Long Term: Exercising regularly at least 3-5 days a week.       Increase Strength and Stamina  Yes       Intervention  Provide advice, education, support and counseling about physical activity/exercise needs.;Develop an individualized exercise prescription  for aerobic and resistive training based on initial evaluation findings, risk stratification, comorbidities and participant's personal goals.       Expected Outcomes  Short Term: Increase workloads from initial exercise prescription for resistance, speed, and METs.;Short Term: Perform resistance training exercises routinely during rehab and add in resistance training at home;Long Term: Improve cardiorespiratory fitness, muscular  endurance and strength as measured by increased METs and functional capacity (6MWT)       Able to understand and use rate of perceived exertion (RPE) scale  Yes       Intervention  Provide education and explanation on how to use RPE scale       Expected Outcomes  Short Term: Able to use RPE daily in rehab to express subjective intensity level;Long Term:  Able to use RPE to guide intensity level when exercising independently       Knowledge and understanding of Target Heart Rate Range (THRR)  Yes       Intervention  Provide education and explanation of THRR including how the numbers were predicted and where they are located for reference       Expected Outcomes  Short Term: Able to state/look up THRR;Long Term: Able to use THRR to govern intensity when exercising independently;Short Term: Able to use daily as guideline for intensity in rehab       Able to check pulse independently  Yes       Intervention  Provide education and demonstration on how to check pulse in carotid and radial arteries.;Review the importance of being able to check your own pulse for safety during independent exercise       Expected Outcomes  Short Term: Able to explain why pulse checking is important during independent exercise;Long Term: Able to check pulse independently and accurately       Understanding of Exercise Prescription  Yes       Intervention  Provide education, explanation, and written materials on patient's individual exercise prescription       Expected Outcomes  Short Term: Able to explain  program exercise prescription;Long Term: Able to explain home exercise prescription to exercise independently          Exercise Goals Re-Evaluation : Exercise Goals Re-Evaluation    Row Name 02/12/18 1548 02/19/18 1534           Exercise Goal Re-Evaluation   Exercise Goals Review  Increase Physical Activity;Able to understand and use rate of perceived exertion (RPE) scale  Increase Physical Activity;Able to understand and use rate of perceived exertion (RPE) scale;Understanding of Exercise Prescription;Knowledge and understanding of Target Heart Rate Range (THRR)      Comments  Patient able to understand and use RPE scale appropriately.  Reviewed home exercise guidelines with patient including THRR, RPE scale, and endpoints for exercise. Pt is walking 20-30 minutes, 2-3 days/week on his treadmill at home.      Expected Outcomes  Increase workloads as tolerated to help improve cardiorespiratory fitness.  Patient will continue to exercise 20-69minutes, 5-6 days/week to help achieve personal health and fitness goals.         Discharge Exercise Prescription (Final Exercise Prescription Changes): Exercise Prescription Changes - 02/12/18 1449      Response to Exercise   Blood Pressure (Admit)  118/72    Blood Pressure (Exercise)  138/82    Blood Pressure (Exit)  98/70    Heart Rate (Admit)  93 bpm    Heart Rate (Exercise)  119 bpm    Heart Rate (Exit)  90 bpm    Rating of Perceived Exertion (Exercise)  10    Symptoms  none    Duration  Progress to 30 minutes of  aerobic without signs/symptoms of physical distress    Intensity  THRR unchanged      Progression   Progression  Continue to progress workloads to maintain  intensity without signs/symptoms of physical distress.    Average METs  3.4      Resistance Training   Training Prescription  Yes    Weight  4lbs    Reps  10-15    Time  10 Minutes      Interval Training   Interval Training  No      Treadmill   MPH  3    Grade  1     Minutes  10    METs  3.71      Bike   Level  1.5    Minutes  10    METs  4.17      NuStep   Level  3    SPM  85    Minutes  10    METs  2.3       Nutrition:  Target Goals: Understanding of nutrition guidelines, daily intake of sodium 1500mg , cholesterol 200mg , calories 30% from fat and 7% or less from saturated fats, daily to have 5 or more servings of fruits and vegetables.  Biometrics: Pre Biometrics - 02/06/18 0804      Pre Biometrics   Height  5' 10.5" (1.791 m)    Weight  90.2 kg    Waist Circumference  36.75 inches    Hip Circumference  43.75 inches    Waist to Hip Ratio  0.84 %    BMI (Calculated)  28.12    Triceps Skinfold  7 mm    % Body Fat  22.5 %    Grip Strength  51 kg    Flexibility  13.5 in    Single Leg Stand  21.18 seconds        Nutrition Therapy Plan and Nutrition Goals: Nutrition Therapy & Goals - 02/09/18 0909      Nutrition Therapy   Diet  heart healthy      Personal Nutrition Goals   Nutrition Goal  to be determined       Nutrition Assessments: Nutrition Assessments - 02/09/18 0910      MEDFICTS Scores   Pre Score  30       Nutrition Goals Re-Evaluation:   Nutrition Goals Re-Evaluation:   Nutrition Goals Discharge (Final Nutrition Goals Re-Evaluation):   Psychosocial: Target Goals: Acknowledge presence or absence of significant depression and/or stress, maximize coping skills, provide positive support system. Participant is able to verbalize types and ability to use techniques and skills needed for reducing stress and depression.  Initial Review & Psychosocial Screening: Initial Psych Review & Screening - 02/06/18 1205      Initial Review   Current issues with  Current Depression;Current Anxiety/Panic      Family Dynamics   Good Support System?  Yes    Comments  David Conner says he has recently had a lot of fear, anxiety and worry and has an appointmenmt to meet a psychiatrist on 02/16/18      Barriers    Psychosocial barriers to participate in program  The patient should benefit from training in stress management and relaxation.      Screening Interventions   Interventions  Encouraged to exercise;To provide support and resources with identified psychosocial needs;Provide feedback about the scores to participant   Zai has an appoinment to meet with the hospital chaplain 02/07/18   Expected Outcomes  Short Term goal: Utilizing psychosocial counselor, staff and physician to assist with identification of specific Stressors or current issues interfering with healing process. Setting desired goal for each stressor or current  issue identified.;Long Term Goal: Stressors or current issues are controlled or eliminated.;Short Term goal: Identification and review with participant of any Quality of Life or Depression concerns found by scoring the questionnaire.;Long Term goal: The participant improves quality of Life and PHQ9 Scores as seen by post scores and/or verbalization of changes       Quality of Life Scores: Quality of Life - 02/12/18 1704      Quality of Life   Select  Quality of Life      Quality of Life Scores   Health/Function Pre  18.7 %    Socioeconomic Pre  22.86 %    Psych/Spiritual Pre  13.21 %    Family Pre  29.5 %    GLOBAL Pre  20.01 %      Scores of 19 and below usually indicate a poorer quality of life in these areas.  A difference of  2-3 points is a clinically meaningful difference.  A difference of 2-3 points in the total score of the Quality of Life Index has been associated with significant improvement in overall quality of life, self-image, physical symptoms, and general health in studies assessing change in quality of life.  PHQ-9: Recent Review Flowsheet Data    Depression screen [facility] 2/9 02/12/2018   Decreased Interest 2   PHQ - 2 Score 2   Altered sleeping 1   Tired, decreased energy 1   Change in appetite 1   Feeling bad or failure about yourself  1   Trouble  concentrating 0   Moving slowly or fidgety/restless 0   Suicidal thoughts 0   PHQ-9 Score 6   Difficult doing work/chores Somewhat difficult     Interpretation of Total Score  Total Score Depression Severity:  1-4 = Minimal depression, 5-9 = Mild depression, 10-14 = Moderate depression, 15-19 = Moderately severe depression, 20-27 = Severe depression   Psychosocial Evaluation and Intervention:   Psychosocial Re-Evaluation: Psychosocial Re-Evaluation    [place] Name 02/22/18 1351             Psychosocial Re-Evaluation   Current issues with  Current Anxiety/Panic;Current Depression       Comments  David Conner continues to receive counseling and has recently started taking Lexapro       Expected Outcomes  David Conner will have decreased anxiety and depression upon completion of phase 2 cardiac rehab       Interventions  Stress management education;Relaxation education;Encouraged to attend Cardiac Rehabilitation for the exercise David Conner is currently seeing a therapist and a psychiatrist       Continue Psychosocial Services   Follow up required by staff          Psychosocial Discharge (Final Psychosocial Re-Evaluation): Psychosocial Re-Evaluation - 02/22/18 1351      Psychosocial Re-Evaluation   Current issues with  Current Anxiety/Panic;Current Depression    Comments  Urho continues to receive counseling and has recently started taking Lexapro    Expected Outcomes  David Conner will have decreased anxiety and depression upon completion of phase 2 cardiac rehab    Interventions  Stress management education;Relaxation education;Encouraged to attend Cardiac Rehabilitation for the exercise   David Conner is currently seeing a therapist and a psychiatrist   Continue Psychosocial Services   Follow up required by staff       Vocational Rehabilitation: Provide vocational rehab assistance to qualifying candidates.   Vocational Rehab Evaluation & Intervention: Vocational Rehab - 02/06/18 1218       Initial Vocational Rehab Evaluation & Intervention  Assessment shows need for Vocational Rehabilitation  No   David Conner will be able to return to his job when his is able to do so and does not need vocatonal rehab at this time      Education: Education Goals: Education classes will be provided on a weekly basis, covering required topics. Participant will state understanding/return demonstration of topics presented.  Learning Barriers/Preferences: Learning Barriers/Preferences - 02/06/18 1216      Learning Barriers/Preferences   Learning Barriers  Sight   wears glasses   Learning Preferences  Audio;Skilled Demonstration;Verbal Instruction;Pictoral;Individual Instruction       Education Topics: Count Your Pulse:  -Group instruction provided by verbal instruction, demonstration, patient participation and written materials to support subject.  Instructors address importance of being able to find your pulse and how to count your pulse when at home without a heart monitor.  Patients get hands on experience counting their pulse with staff help and individually.   Heart Attack, Angina, and Risk Factor Modification:  -Group instruction provided by verbal instruction, video, and written materials to support subject.  Instructors address signs and symptoms of angina and heart attacks.    Also discuss risk factors for heart disease and how to make changes to improve heart health risk factors.   Functional Fitness:  -Group instruction provided by verbal instruction, demonstration, patient participation, and written materials to support subject.  Instructors address safety measures for doing things around the house.  Discuss how to get up and down off the floor, how to pick things up properly, how to safely get out of a chair without assistance, and balance training.   Meditation and Mindfulness:  -Group instruction provided by verbal instruction, patient participation, and written materials to  support subject.  Instructor addresses importance of mindfulness and meditation practice to help reduce stress and improve awareness.  Instructor also leads participants through a meditation exercise.    Stretching for Flexibility and Mobility:  -Group instruction provided by verbal instruction, patient participation, and written materials to support subject.  Instructors lead participants through series of stretches that are designed to increase flexibility thus improving mobility.  These stretches are additional exercise for major muscle groups that are typically performed during regular warm up and cool down.   Hands Only CPR:  -Group verbal, video, and participation provides a basic overview of AHA guidelines for community CPR. Role-play of emergencies allow participants the opportunity to practice calling for help and chest compression technique with discussion of AED use.   Hypertension: -Group verbal and written instruction that provides a basic overview of hypertension including the most recent diagnostic guidelines, risk factor reduction with self-care instructions and medication management.    Nutrition I class: Heart Healthy Eating:  -Group instruction provided by PowerPoint slides, verbal discussion, and written materials to support subject matter. The instructor gives an explanation and review of the Therapeutic Lifestyle Changes diet recommendations, which includes a discussion on lipid goals, dietary fat, sodium, fiber, plant stanol/sterol esters, sugar, and the components of a well-balanced, healthy diet.   Nutrition II class: Lifestyle Skills:  -Group instruction provided by PowerPoint slides, verbal discussion, and written materials to support subject matter. The instructor gives an explanation and review of label reading, grocery shopping for heart health, heart healthy recipe modifications, and ways to make healthier choices when eating out.   Diabetes Question & Answer:   -Group instruction provided by PowerPoint slides, verbal discussion, and written materials to support subject matter. The instructor gives an explanation and review  of diabetes co-morbidities, pre- and post-prandial blood glucose goals, pre-exercise blood glucose goals, signs, symptoms, and treatment of hypoglycemia and hyperglycemia, and foot care basics.   Diabetes Blitz:  -Group instruction provided by PowerPoint slides, verbal discussion, and written materials to support subject matter. The instructor gives an explanation and review of the physiology behind type 1 and type 2 diabetes, diabetes medications and rational behind using different medications, pre- and post-prandial blood glucose recommendations and Hemoglobin A1c goals, diabetes diet, and exercise including blood glucose guidelines for exercising safely.    Portion Distortion:  -Group instruction provided by PowerPoint slides, verbal discussion, written materials, and food models to support subject matter. The instructor gives an explanation of serving size versus portion size, changes in portions sizes over the last 20 years, and what consists of a serving from each food group.   Stress Management:  -Group instruction provided by verbal instruction, video, and written materials to support subject matter.  Instructors review role of stress in heart disease and how to cope with stress positively.     CARDIAC REHAB PHASE II EXERCISE from 02/21/2018 in [place]  Date  02/14/18  Educator  RN  Instruction Review Code  2- Demonstrated Understanding      Exercising on Your Own:  -Group instruction provided by verbal instruction, power point, and written materials to support subject.  Instructors discuss benefits of exercise, components of exercise, frequency and intensity of exercise, and end points for exercise.  Also discuss use of nitroglycerin and activating EMS.  Review options of places to exercise  outside of rehab.  Review guidelines for sex with heart disease.   Cardiac Drugs I:  -Group instruction provided by verbal instruction and written materials to support subject.  Instructor reviews cardiac drug classes: antiplatelets, anticoagulants, beta blockers, and statins.  Instructor discusses reasons, side effects, and lifestyle considerations for each drug class.   Cardiac Drugs II:  -Group instruction provided by verbal instruction and written materials to support subject.  Instructor reviews cardiac drug classes: angiotensin converting enzyme inhibitors (ACE-I), angiotensin II receptor blockers (ARBs), nitrates, and calcium channel blockers.  Instructor discusses reasons, side effects, and lifestyle considerations for each drug class.   Anatomy and Physiology of the Circulatory System:  Group verbal and written instruction and models provide basic cardiac anatomy and physiology, with the coronary electrical and arterial systems. Review of: AMI, Angina, Valve disease, Heart Failure, Peripheral Artery Disease, Cardiac Arrhythmia, Pacemakers, and the ICD.   CARDIAC REHAB PHASE II EXERCISE from 02/21/2018 in [place]  Date  02/21/18  Instruction Review Code  2- Demonstrated Understanding      Other Education:  -Group or individual verbal, written, or video instructions that support the educational goals of the cardiac rehab program.   Holiday Eating Survival Tips:  -Group instruction provided by PowerPoint slides, verbal discussion, and written materials to support subject matter. The instructor gives patients tips, tricks, and techniques to help them not only survive but enjoy the holidays despite the onslaught of food that accompanies the holidays.   Knowledge Questionnaire Score: Knowledge Questionnaire Score - 02/06/18 1134      Knowledge Questionnaire Score   Pre Score  24/24       Core Components/Risk Factors/Patient Goals at  Admission: Personal Goals and Risk Factors at Admission - 02/06/18 1219      Core Components/Risk Factors/Patient Goals on Admission    Weight Management  Yes    Intervention  Weight Management: Develop a combined nutrition and exercise program designed to reach desired caloric intake, while maintaining appropriate intake of nutrient and fiber, sodium and fats, and appropriate energy expenditure required for the weight goal.;Weight Management: Provide education and appropriate resources to help participant work on and attain dietary goals.    Admit Weight  198 lb 13.7 oz (90.2 kg)    Lipids  Yes    Intervention  Provide education and support for participant on nutrition & aerobic/resistive exercise along with prescribed medications to achieve LDL 70mg , HDL >40mg .    Expected Outcomes  Short Term: Participant states understanding of desired cholesterol values and is compliant with medications prescribed. Participant is following exercise prescription and nutrition guidelines.;Long Term: Cholesterol controlled with medications as prescribed, with individualized exercise RX and with personalized nutrition plan. Value goals: LDL < 70mg , HDL > 40 mg.    Stress  Yes    Intervention  Offer individual and/or small group education and counseling on adjustment to heart disease, stress management and health-related lifestyle change. Teach and support self-help strategies.;Refer participants experiencing significant psychosocial distress to appropriate mental health specialists for further evaluation and treatment. When possible, include family members and significant others in education/counseling sessions.    Expected Outcomes  Short Term: Participant demonstrates changes in health-related behavior, relaxation and other stress management skills, ability to obtain effective social support, and compliance with psychotropic medications if prescribed.;Long Term: Emotional wellbeing is indicated by absence of  clinically significant psychosocial distress or social isolation.    Personal Goal Other  Yes    Personal Goal  Be able to exercise without angina.    Intervention  Develop individualized exercise prescription including stretching, aerobic exercise, and resistance training at a level that does not cause angina.     Expected Outcomes  Patient will exercise at level below anginal threshold.       Core Components/Risk Factors/Patient Goals Review:    Core Components/Risk Factors/Patient Goals at Discharge (Final Review):    ITP Comments: ITP Comments    Row Name 02/06/18 0851 02/22/18 1350         ITP Comments  Dr.Traci Radford Pax, Medical Director   30 Day ITP comment. David Conner is off to a good start to exercise         Comments: See ITP comments.Barnet Pall, RN,BSN 02/22/2018 2:21 PM

## 2018-02-23 ENCOUNTER — Encounter (HOSPITAL_COMMUNITY)
Admission: RE | Admit: 2018-02-23 | Discharge: 2018-02-23 | Disposition: A | Discharge status: Home / Self Care | Payer: 59 | Source: Ambulatory Visit | Attending: Cardiovascular Disease | Admitting: Cardiovascular Disease

## 2018-02-23 ENCOUNTER — Ambulatory Visit (HOSPITAL_COMMUNITY): Payer: 59

## 2018-02-23 DIAGNOSIS — Z955 Presence of coronary angioplasty implant and graft: Secondary | ICD-10-CM | POA: Diagnosis not present

## 2018-02-23 DIAGNOSIS — I214 Non-ST elevation (NSTEMI) myocardial infarction: Secondary | ICD-10-CM

## 2018-02-26 ENCOUNTER — Encounter (HOSPITAL_COMMUNITY)
Admission: RE | Admit: 2018-02-26 | Discharge: 2018-02-26 | Disposition: A | Discharge status: Home / Self Care | Payer: 59 | Source: Ambulatory Visit | Attending: Cardiovascular Disease | Admitting: Cardiovascular Disease

## 2018-02-26 ENCOUNTER — Ambulatory Visit (HOSPITAL_COMMUNITY): Payer: 59

## 2018-02-26 DIAGNOSIS — Z955 Presence of coronary angioplasty implant and graft: Secondary | ICD-10-CM

## 2018-02-26 DIAGNOSIS — I214 Non-ST elevation (NSTEMI) myocardial infarction: Secondary | ICD-10-CM

## 2018-02-27 ENCOUNTER — Other Ambulatory Visit: Payer: 59

## 2018-02-28 ENCOUNTER — Ambulatory Visit (HOSPITAL_COMMUNITY): Payer: 59

## 2018-02-28 ENCOUNTER — Encounter (HOSPITAL_COMMUNITY)
Admission: RE | Admit: 2018-02-28 | Discharge: 2018-02-28 | Disposition: A | Discharge status: Home / Self Care | Payer: 59 | Source: Ambulatory Visit | Attending: Cardiovascular Disease | Admitting: Cardiovascular Disease

## 2018-02-28 DIAGNOSIS — Z955 Presence of coronary angioplasty implant and graft: Secondary | ICD-10-CM | POA: Diagnosis not present

## 2018-02-28 DIAGNOSIS — I214 Non-ST elevation (NSTEMI) myocardial infarction: Secondary | ICD-10-CM

## 2018-03-01 ENCOUNTER — Telehealth: Payer: Self-pay

## 2018-03-01 ENCOUNTER — Other Ambulatory Visit: Payer: 59

## 2018-03-01 DIAGNOSIS — E782 Mixed hyperlipidemia: Secondary | ICD-10-CM

## 2018-03-01 NOTE — Telephone Encounter (Signed)
Called to see how the pt was tolerating repatha pt stated some back pain but can't tell if it is from the med or not and ordered/scheduled labs for 4/17

## 2018-03-02 ENCOUNTER — Ambulatory Visit (HOSPITAL_COMMUNITY): Payer: 59

## 2018-03-02 ENCOUNTER — Encounter (HOSPITAL_COMMUNITY)
Admission: RE | Admit: 2018-03-02 | Discharge: 2018-03-02 | Disposition: A | Discharge status: Home / Self Care | Payer: 59 | Source: Ambulatory Visit | Attending: Cardiovascular Disease | Admitting: Cardiovascular Disease

## 2018-03-02 DIAGNOSIS — Z955 Presence of coronary angioplasty implant and graft: Secondary | ICD-10-CM

## 2018-03-02 DIAGNOSIS — I214 Non-ST elevation (NSTEMI) myocardial infarction: Secondary | ICD-10-CM

## 2018-03-02 NOTE — Progress Notes (Signed)
David Conner 58 y.o. male Nutrition Note Spoke with pt. Nutrition Plan and Nutrition Survey goals reviewed with pt. Pt is following a Heart Healthy diet. Pt wants to lose wt, has not been working toward this goal actively. Wt loss tips reviewed (label reading, how to build a healthy plate, portion sizes, eating frequently across the day). Pt shared he has an interest in cooking. Shared mediterranean style recipes with patient, who will try 1 new recipe a week. Pt expressed understanding of the information reviewed. Pt aware of nutrition education classes offered and plan son attending nutrition classes. Has attended Lifestyle skills and Heart healthy nutrition classes.   Lab Results  Component Value Date   HGBA1C 5.3 11/11/2017    Wt Readings from Last 3 Encounters:  02/06/18 198 lb 13.7 oz (90.2 kg)  02/01/18 198 lb 12.8 oz (90.2 kg)  01/18/18 204 lb 6 oz (92.7 kg)    Nutrition Diagnosis  Food-and nutrition-related knowledge deficit related to lack of exposure to information as related to diagnosis of: ? CVD   Overweight  related to excessive energy intake as evidenced by a BMI = 28.11 (02/01/18)  Nutrition Intervention ? Pt's individual nutrition plan reviewed with pt. ? Benefits of adopting Heart Healthy diet discussed when Med[employer] reviewed.   ? Pt given handouts for: ? Nutrition I class ? Nutrition II class ? Diabetes Blitz Class ? Consistent vit K diet ? low sodium ? DM ? pre-diabetes ? Diabetes Q & A class ? Continue client-centered nutrition education by RD, as part of interdisciplinary care.   Nutrition Goal(s):   Pt to identify and limit food sources of sodium, saturated fat, trans fat, and refined carbohydrates  Pt to build a healthy plate  Pt to weigh and measure portion sizes for accuracy   Plan:  Pt to attend nutrition classes ? Nutrition I ? Nutrition II ? Portion Distortion  Will provide client-centered nutrition education as part of interdisciplinary  care.   Monitor and evaluate progress toward nutrition goal with team.    Laurina Bustle, MS, RD, LDN 03/02/2018 2:38 PM

## 2018-03-03 MED FILL — TESTOSTERONE 20.25 MG/ACT (: 20.25 MG/AC | 30 days supply | Qty: 75 | Fill #1

## 2018-03-03 MED FILL — ISOSORBIDE MN ER 60 MG TAB: 60 | 30 days supply | Qty: 30 | Fill #1

## 2018-03-05 ENCOUNTER — Ambulatory Visit (HOSPITAL_COMMUNITY): Payer: 59

## 2018-03-05 ENCOUNTER — Encounter (HOSPITAL_COMMUNITY)
Admission: RE | Admit: 2018-03-05 | Discharge: 2018-03-05 | Disposition: A | Discharge status: Home / Self Care | Payer: 59 | Source: Ambulatory Visit | Attending: Cardiovascular Disease | Admitting: Cardiovascular Disease

## 2018-03-05 ENCOUNTER — Ambulatory Visit: Payer: 59 | Admitting: Cardiovascular Disease

## 2018-03-05 DIAGNOSIS — Z955 Presence of coronary angioplasty implant and graft: Secondary | ICD-10-CM

## 2018-03-05 DIAGNOSIS — I214 Non-ST elevation (NSTEMI) myocardial infarction: Secondary | ICD-10-CM

## 2018-03-05 MED FILL — METOPROLOL SUCCINATE ER 100: 100 | 30 days supply | Qty: 60 | Fill #3

## 2018-03-05 MED FILL — BRILINTA 90 MG TABLET: 90 | 30 days supply | Qty: 60 | Fill #3

## 2018-03-06 MED FILL — ZOLPIDEM TARTRATE 10 MG TAB: 10 | 30 days supply | Qty: 30 | Fill #5

## 2018-03-06 MED FILL — ALPRAZolam 0.5 MG TABS: 0.5 | 30 days supply | Qty: 90 | Fill #2

## 2018-03-07 ENCOUNTER — Ambulatory Visit (HOSPITAL_COMMUNITY): Payer: 59

## 2018-03-07 ENCOUNTER — Encounter (HOSPITAL_COMMUNITY)
Admission: RE | Admit: 2018-03-07 | Discharge: 2018-03-07 | Disposition: A | Discharge status: Home / Self Care | Payer: 59 | Source: Ambulatory Visit | Attending: Cardiovascular Disease | Admitting: Cardiovascular Disease

## 2018-03-07 DIAGNOSIS — Z955 Presence of coronary angioplasty implant and graft: Secondary | ICD-10-CM | POA: Diagnosis not present

## 2018-03-07 DIAGNOSIS — I214 Non-ST elevation (NSTEMI) myocardial infarction: Secondary | ICD-10-CM

## 2018-03-09 ENCOUNTER — Ambulatory Visit (HOSPITAL_COMMUNITY): Payer: 59

## 2018-03-09 ENCOUNTER — Encounter (HOSPITAL_COMMUNITY)
Admission: RE | Admit: 2018-03-09 | Discharge: 2018-03-09 | Disposition: A | Discharge status: Home / Self Care | Payer: 59 | Source: Ambulatory Visit | Attending: Cardiovascular Disease | Admitting: Cardiovascular Disease

## 2018-03-09 DIAGNOSIS — Z955 Presence of coronary angioplasty implant and graft: Secondary | ICD-10-CM

## 2018-03-09 DIAGNOSIS — I214 Non-ST elevation (NSTEMI) myocardial infarction: Secondary | ICD-10-CM

## 2018-03-12 ENCOUNTER — Ambulatory Visit (HOSPITAL_COMMUNITY): Payer: 59

## 2018-03-12 ENCOUNTER — Encounter (HOSPITAL_COMMUNITY)
Admission: RE | Admit: 2018-03-12 | Discharge: 2018-03-12 | Disposition: A | Discharge status: Home / Self Care | Payer: 59 | Source: Ambulatory Visit | Attending: Cardiovascular Disease | Admitting: Cardiovascular Disease

## 2018-03-12 DIAGNOSIS — Z955 Presence of coronary angioplasty implant and graft: Secondary | ICD-10-CM | POA: Diagnosis not present

## 2018-03-12 DIAGNOSIS — I214 Non-ST elevation (NSTEMI) myocardial infarction: Secondary | ICD-10-CM

## 2018-03-14 ENCOUNTER — Ambulatory Visit (HOSPITAL_COMMUNITY): Payer: 59

## 2018-03-14 ENCOUNTER — Encounter (HOSPITAL_COMMUNITY)
Admission: RE | Admit: 2018-03-14 | Discharge: 2018-03-14 | Disposition: A | Discharge status: Home / Self Care | Payer: 59 | Source: Ambulatory Visit | Attending: Cardiovascular Disease | Admitting: Cardiovascular Disease

## 2018-03-14 DIAGNOSIS — Z955 Presence of coronary angioplasty implant and graft: Secondary | ICD-10-CM | POA: Diagnosis not present

## 2018-03-14 DIAGNOSIS — I214 Non-ST elevation (NSTEMI) myocardial infarction: Secondary | ICD-10-CM

## 2018-03-14 MED FILL — ESCITALOPRAM 10 MG TABLET: 10 | 30 days supply | Qty: 30 | Fill #0

## 2018-03-15 ENCOUNTER — Other Ambulatory Visit: Payer: 59

## 2018-03-15 ENCOUNTER — Telehealth: Payer: Self-pay | Admitting: *Deleted

## 2018-03-15 NOTE — Telephone Encounter (Signed)
-----   Message from Elberta Fortis sent at 03/15/2018  9:02 AM EST ----- Regarding: dental Clearance Good Morning,  Dr. Warren Lacy Temple's office is calling about a Clearance for his cleaning. Patient is in the office now. I informed them to fax a request over for the clearance. Thank you.

## 2018-03-15 NOTE — Telephone Encounter (Signed)
I called and spoke with the receptionist at Dr. Shon Hough office and advised her that patient does not need antibiotics prior to dental cleaning. She asked if patient can proceed with cleaning today or if it should be postponed. I advised that there is no waiting time necessary. She requests form to be faxed back to their office with this information. I advised that it can be completed and faxed today but it will not be right away since Dr. Acie Fredrickson is in clinic. She thanked me for the call.

## 2018-03-15 NOTE — Telephone Encounter (Signed)
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  Patient is in the office now. I informed them to fax a request over for the clearance.  Thank you.

## 2018-03-15 NOTE — Progress Notes (Signed)
Cardiac Individual Treatment Plan  Patient Details  Name: David Conner MRN: [id number] Date of Birth: [birth year] Referring Provider:     CARDIAC REHAB PHASE II ORIENTATION from 02/06/2018 in [place]  Referring Provider  Nahser, Wonda Cheng, MD      Initial Encounter Date:    CARDIAC REHAB PHASE II ORIENTATION from 02/06/2018 in [place]  Date  02/06/18      Visit Diagnosis: 12/12/2017 Stented coronary artery S/P DES LAD 11/13/17, S/P DES OM 1 and 2 12/12/17  11/11/2017 NSTEMI (non-ST elevated myocardial infarction) [facility])  Patient's Home Medications on Admission:  Current Outpatient Medications:  .  ALPRAZolam (XANAX) 0.5 MG tablet, Take 0.5 mg by mouth 3 (three) times daily. , Disp: , Rfl:  .  aspirin EC 81 MG tablet, Take 81 mg by mouth daily. , Disp: , Rfl:  .  atorvastatin (LIPITOR) 10 MG tablet, Take 1 tablet (10 mg total) by mouth daily. (Patient not taking: Reported on 02/01/2018), Disp: 90 tablet, Rfl: 3 .  escitalopram (LEXAPRO) 5 MG tablet, Take 5 mg by mouth daily., Disp: , Rfl:  .  esomeprazole (NEXIUM) 40 MG capsule, Take 40 mg by mouth daily. , Disp: , Rfl:  .  Evolocumab (REPATHA SURECLICK) 119 MG/ML SOAJ, Inject 1 pen into the skin every 14 (fourteen) days., Disp: 2 pen, Rfl: 11 .  isosorbide mononitrate (IMDUR) 30 MG 24 hr tablet, Take 1 tablet (30 mg total) by mouth daily. (Patient taking differently: Take 60 mg by mouth daily. ), Disp: 30 tablet, Rfl: 11 .  metoprolol succinate (TOPROL-XL) 100 MG 24 hr tablet, Take 1 tablet (100 mg total) by mouth 2 (two) times daily. Take with or immediately following a meal., Disp: 180 tablet, Rfl: 3 .  nitroGLYCERIN (NITROSTAT) 0.3 MG SL tablet, Place 1 tablet (0.3 mg total) under the tongue every 5 (five) minutes as needed for chest pain., Disp: 100 tablet, Rfl: 1 .  polyethylene glycol (MIRALAX / GLYCOLAX) packet, Take 17 g by mouth daily., Disp: , Rfl:  .   Testosterone (ANDROGEL PUMP) 20.25 MG/ACT (1.62%) GEL, Place 40.5 mg onto the skin daily. 2 pumps, Disp: , Rfl:  .  ticagrelor (BRILINTA) 90 MG TABS tablet, Take 1 tablet (90 mg total) by mouth 2 (two) times daily., Disp: 180 tablet, Rfl: 9 .  zolpidem (AMBIEN) 10 MG tablet, Take 10 mg by mouth at bedtime., Disp: , Rfl: 1  Past Medical History: Past Medical History:  Diagnosis Date  . Allergy   . Complication of anesthesia    upper gi done, had anxiety with versed  . Coronary artery disease    11/13/17:PCI to mid LAD using Resolute Onyx 3.0 x 18 mm DES  . Depression    slight, on sertraline  . ED (erectile dysfunction)   . GERD (gastroesophageal reflux disease)   . Hyperlipidemia   . Nausea occasional  . Restless legs syndrome     Tobacco Use: Social History   Tobacco Use  Smoking Status Never Smoker  Smokeless Tobacco Never Used    Labs: Recent Review Scientist, physiological    Labs for ITP Cardiac and Pulmonary Rehab Latest Ref Rng & Units 11/11/2017   Cholestrol 0 - 200 mg/dL 191   LDLCALC 0 - 99 mg/dL 142(H)   HDL >40 mg/dL 41   Trlycerides <150 mg/dL 38   Hemoglobin A1c 4.8 - 5.6 % 5.3      Capillary Blood Glucose: No results  found for: GLUCAP   Exercise Target Goals: Exercise Program Goal: Individual exercise prescription set using results from initial 6 min walk test and THRR while considering  patient's activity barriers and safety.   Exercise Prescription Goal: Initial exercise prescription builds to 30-45 minutes a day of aerobic activity, 2-3 days per week.  Home exercise guidelines will be given to patient during program as part of exercise prescription that the participant will acknowledge.  Activity Barriers & Risk Stratification: Activity Barriers & Cardiac Risk Stratification - 02/06/18 0924      Activity Barriers & Cardiac Risk Stratification   Activity Barriers  None    Cardiac Risk Stratification  High       6 Minute Walk: 6 Minute Walk    Row  Name 02/06/18 0856         6 Minute Walk   Phase  Initial     Distance  1928 feet     Walk Time  6 minutes     # of Rest Breaks  0     MPH  3.65     METS  4.51     RPE  10     Perceived Dyspnea   0     VO2 Peak  15.77     Symptoms  No     Resting HR  74 bpm     Resting BP  92/60     Resting Oxygen Saturation   97 %     Exercise Oxygen Saturation  during 6 min walk  98 %     Max Ex. HR  98 bpm     Max Ex. BP  104/62     2 Minute Post BP  108/70        Oxygen Initial Assessment:   Oxygen Re-Evaluation:   Oxygen Discharge (Final Oxygen Re-Evaluation):   Initial Exercise Prescription: Initial Exercise Prescription - 02/06/18 1100      Date of Initial Exercise RX and Referring Provider   Date  02/06/18    Referring Provider  Nahser, Wonda Cheng, MD    Expected Discharge Date  05/14/18      Treadmill   MPH  2.5    Grade  1    Minutes  10    METs  3.26      Bike   Level  1.5    Minutes  10    METs  4.14      NuStep   Level  3    SPM  85    Minutes  10    METs  3      Prescription Details   Frequency (times per week)  3    Duration  Progress to 30 minutes of continuous aerobic without signs/symptoms of physical distress      Intensity   THRR 40-80% of Max Heartrate  65-131    Ratings of Perceived Exertion  11-13    Perceived Dyspnea  0-4      Progression   Progression  Continue to progress workloads to maintain intensity without signs/symptoms of physical distress.      Resistance Training   Training Prescription  Yes    Weight  4lbs    Reps  10-15       Perform Capillary Blood Glucose checks as needed.  Exercise Prescription Changes:  Exercise Prescription Changes    Row Name 02/12/18 1449 02/26/18 1450 03/12/18 1451         Response to Exercise   Blood  Pressure (Admit)  118/72  104/64  122/70     Blood Pressure (Exercise)  138/82  118/70  122/80     Blood Pressure (Exit)  98/70  108/76  98/72     Heart Rate (Admit)  93 bpm  62 bpm  79  bpm     Heart Rate (Exercise)  119 bpm  104 bpm  117 bpm     Heart Rate (Exit)  90 bpm  62 bpm  79 bpm     Rating of Perceived Exertion (Exercise)  10  12  13      Symptoms  none  none  none     Duration  Progress to 30 minutes of  aerobic without signs/symptoms of physical distress  Progress to 30 minutes of  aerobic without signs/symptoms of physical distress  Progress to 30 minutes of  aerobic without signs/symptoms of physical distress     Intensity  THRR unchanged  THRR unchanged  THRR unchanged       Progression   Progression  Continue to progress workloads to maintain intensity without signs/symptoms of physical distress.  Continue to progress workloads to maintain intensity without signs/symptoms of physical distress.  Continue to progress workloads to maintain intensity without signs/symptoms of physical distress.     Average METs  3.4  3.9  4.5       Resistance Training   Training Prescription  Yes  Yes  Yes     Weight  4lbs  4lbs  4lbs     Reps  10-15  10-15  10-15     Time  10 Minutes  10 Minutes  10 Minutes       Interval Training   Interval Training  No  No  No       Treadmill   MPH  3  3.2  3.2     Grade  1  1  3      Minutes  10  10  10      METs  3.71  3.89  4.77       Bike   Level  1.5  1.8  2.3     Minutes  10  10  10      METs  4.17  4.82  5.7       NuStep   Level  3  5  5      SPM  85  85  85     Minutes  10  10  10      METs  2.3  2.9  3.1       Home Exercise Plan   Plans to continue exercise at  -  Home (comment) Treadmill, walking  Home (comment) Treadmill, walking     Frequency  -  Add 2 additional days to program exercise sessions.  Add 2 additional days to program exercise sessions.     Initial Home Exercises Provided  -  02/19/18  02/19/18        Exercise Comments:  Exercise Comments    Row Name 02/12/18 1548 02/19/18 1534 02/26/18 1513 03/12/18 1521     Exercise Comments  Patient tolerated first session of exercise well without symptoms.   Reviewed home exercise guidelines, METs, and goals with patient.  Reviewed METs and goals with patient.  METs reviewed with patient.       Exercise Goals and Review:  Exercise Goals    Row Name 02/06/18 [phone]             Exercise  Goals   Increase Physical Activity  Yes       Intervention  Provide advice, education, support and counseling about physical activity/exercise needs.;Develop an individualized exercise prescription for aerobic and resistive training based on initial evaluation findings, risk stratification, comorbidities and participant's personal goals.       Expected Outcomes  Short Term: Attend rehab on a regular basis to increase amount of physical activity.;Long Term: Add in home exercise to make exercise part of routine and to increase amount of physical activity.;Long Term: Exercising regularly at least 3-5 days a week.       Increase Strength and Stamina  Yes       Intervention  Provide advice, education, support and counseling about physical activity/exercise needs.;Develop an individualized exercise prescription for aerobic and resistive training based on initial evaluation findings, risk stratification, comorbidities and participant's personal goals.       Expected Outcomes  Short Term: Increase workloads from initial exercise prescription for resistance, speed, and METs.;Short Term: Perform resistance training exercises routinely during rehab and add in resistance training at home;Long Term: Improve cardiorespiratory fitness, muscular endurance and strength as measured by increased METs and functional capacity (6MWT)       Able to understand and use rate of perceived exertion (RPE) scale  Yes       Intervention  Provide education and explanation on how to use RPE scale       Expected Outcomes  Short Term: Able to use RPE daily in rehab to express subjective intensity level;Long Term:  Able to use RPE to guide intensity level when exercising independently       Knowledge and  understanding of Target Heart Rate Range (THRR)  Yes       Intervention  Provide education and explanation of THRR including how the numbers were predicted and where they are located for reference       Expected Outcomes  Short Term: Able to state/look up THRR;Long Term: Able to use THRR to govern intensity when exercising independently;Short Term: Able to use daily as guideline for intensity in rehab       Able to check pulse independently  Yes       Intervention  Provide education and demonstration on how to check pulse in carotid and radial arteries.;Review the importance of being able to check your own pulse for safety during independent exercise       Expected Outcomes  Short Term: Able to explain why pulse checking is important during independent exercise;Long Term: Able to check pulse independently and accurately       Understanding of Exercise Prescription  Yes       Intervention  Provide education, explanation, and written materials on patient's individual exercise prescription       Expected Outcomes  Short Term: Able to explain program exercise prescription;Long Term: Able to explain home exercise prescription to exercise independently          Exercise Goals Re-Evaluation : Exercise Goals Re-Evaluation    Row Name 02/12/18 1548 02/19/18 1534 02/26/18 1513         Exercise Goal Re-Evaluation   Exercise Goals Review  Increase Physical Activity;Able to understand and use rate of perceived exertion (RPE) scale  Increase Physical Activity;Able to understand and use rate of perceived exertion (RPE) scale;Understanding of Exercise Prescription;Knowledge and understanding of Target Heart Rate Range (THRR)  Increase Physical Activity;Able to understand and use rate of perceived exertion (RPE) scale;Understanding of Exercise Prescription;Knowledge and understanding of Target Heart  Rate Range (THRR);Increase Strength and Stamina     Comments  Patient able to understand and use RPE scale  appropriately.  Reviewed home exercise guidelines with patient including THRR, RPE scale, and endpoints for exercise. Pt is walking 20-30 minutes, 2-3 days/week on his treadmill at home.  Patient continuing to exercise at least 1-2 days/week at home on his TM. Pt is having no angina with exercise.     Expected Outcomes  Increase workloads as tolerated to help improve cardiorespiratory fitness.  Patient will continue to exercise 20-69minutes, 5-6 days/week to help achieve personal health and fitness goals.  Increase workloads as tolerated to help improve strength and stamina.        Discharge Exercise Prescription (Final Exercise Prescription Changes): Exercise Prescription Changes - 03/12/18 1451      Response to Exercise   Blood Pressure (Admit)  122/70    Blood Pressure (Exercise)  122/80    Blood Pressure (Exit)  98/72    Heart Rate (Admit)  79 bpm    Heart Rate (Exercise)  117 bpm    Heart Rate (Exit)  79 bpm    Rating of Perceived Exertion (Exercise)  13    Symptoms  none    Duration  Progress to 30 minutes of  aerobic without signs/symptoms of physical distress    Intensity  THRR unchanged      Progression   Progression  Continue to progress workloads to maintain intensity without signs/symptoms of physical distress.    Average METs  4.5      Resistance Training   Training Prescription  Yes    Weight  4lbs    Reps  10-15    Time  10 Minutes      Interval Training   Interval Training  No      Treadmill   MPH  3.2    Grade  3    Minutes  10    METs  4.77      Bike   Level  2.3    Minutes  10    METs  5.7      NuStep   Level  5    SPM  85    Minutes  10    METs  3.1      Home Exercise Plan   Plans to continue exercise at  Home (comment)   Treadmill, walking   Frequency  Add 2 additional days to program exercise sessions.    Initial Home Exercises Provided  02/19/18       Nutrition:  Target Goals: Understanding of nutrition guidelines, daily intake of  sodium 1500mg , cholesterol 200mg , calories 30% from fat and 7% or less from saturated fats, daily to have 5 or more servings of fruits and vegetables.  Biometrics: Pre Biometrics - 02/06/18 0804      Pre Biometrics   Height  5' 10.5" (1.791 m)    Weight  198 lb 13.7 oz (90.2 kg)    Waist Circumference  36.75 inches    Hip Circumference  43.75 inches    Waist to Hip Ratio  0.84 %    BMI (Calculated)  28.12    Triceps Skinfold  7 mm    % Body Fat  22.5 %    Grip Strength  51 kg    Flexibility  13.5 in    Single Leg Stand  21.18 seconds        Nutrition Therapy Plan and Nutrition Goals: Nutrition Therapy & Goals - 03/02/18  1440      Nutrition Therapy   Diet  heart healthy      Personal Nutrition Goals   Nutrition Goal  Pt to identify and limit food sources of sodium, saturated fat, trans fat, and refined carbohydrates    Personal Goal #2  Pt to build a healthy plate    Personal Goal #3  Pt to weigh and measure portion sizes for accuracy       Intervention Plan   Intervention  Prescribe, educate and counsel regarding individualized specific dietary modifications aiming towards targeted core components such as weight, hypertension, lipid management, diabetes, heart failure and other comorbidities.    Expected Outcomes  Short Term Goal: Understand basic principles of dietary content, such as calories, fat, sodium, cholesterol and nutrients.;Long Term Goal: Adherence to prescribed nutrition plan.       Nutrition Assessments: Nutrition Assessments - 02/09/18 0910      MEDFICTS Scores   Pre Score  30       Nutrition Goals Re-Evaluation:   Nutrition Goals Re-Evaluation:   Nutrition Goals Discharge (Final Nutrition Goals Re-Evaluation):   Psychosocial: Target Goals: Acknowledge presence or absence of significant depression and/or stress, maximize coping skills, provide positive support system. Participant is able to verbalize types and ability to use techniques and  skills needed for reducing stress and depression.  Initial Review & Psychosocial Screening: Initial Psych Review & Screening - 02/06/18 1205      Initial Review   Current issues with  Current Depression;Current Anxiety/Panic      Family Dynamics   Good Support System?  Yes    Comments  Timithy says he has recently had a lot of fear, anxiety and worry and has an appointmenmt to meet a psychiatrist on 02/16/18      Barriers   Psychosocial barriers to participate in program  The patient should benefit from training in stress management and relaxation.      Screening Interventions   Interventions  Encouraged to exercise;To provide support and resources with identified psychosocial needs;Provide feedback about the scores to participant   Timouthy has an appoinment to meet with the hospital chaplain 02/07/18   Expected Outcomes  Short Term goal: Utilizing psychosocial counselor, staff and physician to assist with identification of specific Stressors or current issues interfering with healing process. Setting desired goal for each stressor or current issue identified.;Long Term Goal: Stressors or current issues are controlled or eliminated.;Short Term goal: Identification and review with participant of any Quality of Life or Depression concerns found by scoring the questionnaire.;Long Term goal: The participant improves quality of Life and PHQ9 Scores as seen by post scores and/or verbalization of changes       Quality of Life Scores: Quality of Life - 02/12/18 1704      Quality of Life   Select  Quality of Life      Quality of Life Scores   Health/Function Pre  18.7 %    Socioeconomic Pre  22.86 %    Psych/Spiritual Pre  13.21 %    Family Pre  29.5 %    GLOBAL Pre  20.01 %      Scores of 19 and below usually indicate a poorer quality of life in these areas.  A difference of  2-3 points is a clinically meaningful difference.  A difference of 2-3 points in the total score of the Quality of  Life Index has been associated with significant improvement in overall quality of life, self-image, physical symptoms, and  general health in studies assessing change in quality of life.  PHQ-9: Recent Review Flowsheet Data    Depression screen [facility] 2/9 02/12/2018   Decreased Interest 2   PHQ - 2 Score 2   Altered sleeping 1   Tired, decreased energy 1   Change in appetite 1   Feeling bad or failure about yourself  1   Trouble concentrating 0   Moving slowly or fidgety/restless 0   Suicidal thoughts 0   PHQ-9 Score 6   Difficult doing work/chores Somewhat difficult     Interpretation of Total Score  Total Score Depression Severity:  1-4 = Minimal depression, 5-9 = Mild depression, 10-14 = Moderate depression, 15-19 = Moderately severe depression, 20-27 = Severe depression   Psychosocial Evaluation and Intervention:   Psychosocial Re-Evaluation: Psychosocial Re-Evaluation    [place] Name 02/22/18 1351 03/15/18 1609           Psychosocial Re-Evaluation   Current issues with  Current Anxiety/Panic;Current Depression  Current Anxiety/Panic;Current Depression      Comments  Landis continues to receive counseling and has recently started taking Lexapro  Edwin continues to receive counseling. Teddie is doing well on his current dose of antidepressant.       Expected Outcomes  Corban will have decreased anxiety and depression upon completion of phase 2 cardiac rehab  Rasaan will have decreased anxiety and depression upon completion of phase 2 cardiac rehab      Interventions  Stress management education;Relaxation education;Encouraged to attend Cardiac Rehabilitation for the exercise Shanon Brow is currently seeing a therapist and a psychiatrist  Stress management education;Relaxation education;Encouraged to attend Cardiac Rehabilitation for the exercise      Continue Psychosocial Services   Follow up required by staff  Follow up required by staff         Psychosocial Discharge (Final  Psychosocial Re-Evaluation): Psychosocial Re-Evaluation - 03/15/18 1609      Psychosocial Re-Evaluation   Current issues with  Current Anxiety/Panic;Current Depression    Comments  Angelgabriel continues to receive counseling. Jacobus is doing well on his current dose of antidepressant.     Expected Outcomes  Abdulraheem will have decreased anxiety and depression upon completion of phase 2 cardiac rehab    Interventions  Stress management education;Relaxation education;Encouraged to attend Cardiac Rehabilitation for the exercise    Continue Psychosocial Services   Follow up required by staff       Vocational Rehabilitation: Provide vocational rehab assistance to qualifying candidates.   Vocational Rehab Evaluation & Intervention: Vocational Rehab - 02/06/18 1218      Initial Vocational Rehab Evaluation & Intervention   Assessment shows need for Vocational Rehabilitation  No   Chee will be able to return to his job when his is able to do so and does not need vocatonal rehab at this time      Education: Education Goals: Education classes will be provided on a weekly basis, covering required topics. Participant will state understanding/return demonstration of topics presented.  Learning Barriers/Preferences: Learning Barriers/Preferences - 02/06/18 1216      Learning Barriers/Preferences   Learning Barriers  Sight   wears glasses   Learning Preferences  Audio;Skilled Demonstration;Verbal Instruction;Pictoral;Individual Instruction       Education Topics: Count Your Pulse:  -Group instruction provided by verbal instruction, demonstration, patient participation and written materials to support subject.  Instructors address importance of being able to find your pulse and how to count your pulse when at home without a heart monitor.  Patients  get hands on experience counting their pulse with staff help and individually.   Heart Attack, Angina, and Risk Factor Modification:  -Group  instruction provided by verbal instruction, video, and written materials to support subject.  Instructors address signs and symptoms of angina and heart attacks.    Also discuss risk factors for heart disease and how to make changes to improve heart health risk factors.   Functional Fitness:  -Group instruction provided by verbal instruction, demonstration, patient participation, and written materials to support subject.  Instructors address safety measures for doing things around the house.  Discuss how to get up and down off the floor, how to pick things up properly, how to safely get out of a chair without assistance, and balance training.   Meditation and Mindfulness:  -Group instruction provided by verbal instruction, patient participation, and written materials to support subject.  Instructor addresses importance of mindfulness and meditation practice to help reduce stress and improve awareness.  Instructor also leads participants through a meditation exercise.    Stretching for Flexibility and Mobility:  -Group instruction provided by verbal instruction, patient participation, and written materials to support subject.  Instructors lead participants through series of stretches that are designed to increase flexibility thus improving mobility.  These stretches are additional exercise for major muscle groups that are typically performed during regular warm up and cool down.   Hands Only CPR:  -Group verbal, video, and participation provides a basic overview of AHA guidelines for community CPR. Role-play of emergencies allow participants the opportunity to practice calling for help and chest compression technique with discussion of AED use.   Hypertension: -Group verbal and written instruction that provides a basic overview of hypertension including the most recent diagnostic guidelines, risk factor reduction with self-care instructions and medication management.    Nutrition I class:  Heart Healthy Eating:  -Group instruction provided by PowerPoint slides, verbal discussion, and written materials to support subject matter. The instructor gives an explanation and review of the Therapeutic Lifestyle Changes diet recommendations, which includes a discussion on lipid goals, dietary fat, sodium, fiber, plant stanol/sterol esters, sugar, and the components of a well-balanced, healthy diet.   Nutrition II class: Lifestyle Skills:  -Group instruction provided by PowerPoint slides, verbal discussion, and written materials to support subject matter. The instructor gives an explanation and review of label reading, grocery shopping for heart health, heart healthy recipe modifications, and ways to make healthier choices when eating out.   Diabetes Question & Answer:  -Group instruction provided by PowerPoint slides, verbal discussion, and written materials to support subject matter. The instructor gives an explanation and review of diabetes co-morbidities, pre- and post-prandial blood glucose goals, pre-exercise blood glucose goals, signs, symptoms, and treatment of hypoglycemia and hyperglycemia, and foot care basics.   Diabetes Blitz:  -Group instruction provided by PowerPoint slides, verbal discussion, and written materials to support subject matter. The instructor gives an explanation and review of the physiology behind type 1 and type 2 diabetes, diabetes medications and rational behind using different medications, pre- and post-prandial blood glucose recommendations and Hemoglobin A1c goals, diabetes diet, and exercise including blood glucose guidelines for exercising safely.    Portion Distortion:  -Group instruction provided by PowerPoint slides, verbal discussion, written materials, and food models to support subject matter. The instructor gives an explanation of serving size versus portion size, changes in portions sizes over the last 20 years, and what consists of a serving from  each food group.   Stress Management:  -  Group instruction provided by verbal instruction, video, and written materials to support subject matter.  Instructors review role of stress in heart disease and how to cope with stress positively.     CARDIAC REHAB PHASE II EXERCISE from 03/14/2018 in [place]  Date  02/14/18  Educator  RN  Instruction Review Code  2- Demonstrated Understanding      Exercising on Your Own:  -Group instruction provided by verbal instruction, power point, and written materials to support subject.  Instructors discuss benefits of exercise, components of exercise, frequency and intensity of exercise, and end points for exercise.  Also discuss use of nitroglycerin and activating EMS.  Review options of places to exercise outside of rehab.  Review guidelines for sex with heart disease.   CARDIAC REHAB PHASE II EXERCISE from 03/14/2018 in [place]  Date  03/14/18  Instruction Review Code  2- Demonstrated Understanding      Cardiac Drugs I:  -Group instruction provided by verbal instruction and written materials to support subject.  Instructor reviews cardiac drug classes: antiplatelets, anticoagulants, beta blockers, and statins.  Instructor discusses reasons, side effects, and lifestyle considerations for each drug class.   Cardiac Drugs II:  -Group instruction provided by verbal instruction and written materials to support subject.  Instructor reviews cardiac drug classes: angiotensin converting enzyme inhibitors (ACE-I), angiotensin II receptor blockers (ARBs), nitrates, and calcium channel blockers.  Instructor discusses reasons, side effects, and lifestyle considerations for each drug class.   CARDIAC REHAB PHASE II EXERCISE from 03/14/2018 in [place]  Date  02/28/18  Instruction Review Code  2- Demonstrated Understanding      Anatomy and Physiology of the  Circulatory System:  Group verbal and written instruction and models provide basic cardiac anatomy and physiology, with the coronary electrical and arterial systems. Review of: AMI, Angina, Valve disease, Heart Failure, Peripheral Artery Disease, Cardiac Arrhythmia, Pacemakers, and the ICD.   CARDIAC REHAB PHASE II EXERCISE from 03/14/2018 in [place]  Date  02/21/18  Instruction Review Code  2- Demonstrated Understanding      Other Education:  -Group or individual verbal, written, or video instructions that support the educational goals of the cardiac rehab program.   Holiday Eating Survival Tips:  -Group instruction provided by PowerPoint slides, verbal discussion, and written materials to support subject matter. The instructor gives patients tips, tricks, and techniques to help them not only survive but enjoy the holidays despite the onslaught of food that accompanies the holidays.   Knowledge Questionnaire Score: Knowledge Questionnaire Score - 02/06/18 1134      Knowledge Questionnaire Score   Pre Score  24/24       Core Components/Risk Factors/Patient Goals at Admission: Personal Goals and Risk Factors at Admission - 02/06/18 1219      Core Components/Risk Factors/Patient Goals on Admission    Weight Management  Yes    Intervention  Weight Management: Develop a combined nutrition and exercise program designed to reach desired caloric intake, while maintaining appropriate intake of nutrient and fiber, sodium and fats, and appropriate energy expenditure required for the weight goal.;Weight Management: Provide education and appropriate resources to help participant work on and attain dietary goals.    Admit Weight  198 lb 13.7 oz (90.2 kg)    Lipids  Yes    Intervention  Provide education and support for participant on nutrition & aerobic/resistive exercise along with prescribed medications  to achieve LDL 70mg , HDL >40mg .    Expected Outcomes   Short Term: Participant states understanding of desired cholesterol values and is compliant with medications prescribed. Participant is following exercise prescription and nutrition guidelines.;Long Term: Cholesterol controlled with medications as prescribed, with individualized exercise RX and with personalized nutrition plan. Value goals: LDL < 70mg , HDL > 40 mg.    Stress  Yes    Intervention  Offer individual and/or small group education and counseling on adjustment to heart disease, stress management and health-related lifestyle change. Teach and support self-help strategies.;Refer participants experiencing significant psychosocial distress to appropriate mental health specialists for further evaluation and treatment. When possible, include family members and significant others in education/counseling sessions.    Expected Outcomes  Short Term: Participant demonstrates changes in health-related behavior, relaxation and other stress management skills, ability to obtain effective social support, and compliance with psychotropic medications if prescribed.;Long Term: Emotional wellbeing is indicated by absence of clinically significant psychosocial distress or social isolation.    Personal Goal Other  Yes    Personal Goal  Be able to exercise without angina.    Intervention  Develop individualized exercise prescription including stretching, aerobic exercise, and resistance training at a level that does not cause angina.     Expected Outcomes  Patient will exercise at level below anginal threshold.       Core Components/Risk Factors/Patient Goals Review:  Goals and Risk Factor Review    Row Name 02/22/18 1404 03/15/18 1614           Core Components/Risk Factors/Patient Goals Review   Personal Goals Review  Weight Management/Obesity;Lipids;Stress  Weight Management/Obesity;Lipids;Stress      Review  Dilraj is doing well with exercise. Surafel's vital signs have been stable.  Jamone has not  voiced having any increased depression or anxiety  Nguyen is doing well with exercise. Olanda's vital signs have been stable.  Clevester says his depression is getting better. Shant is enjoying participating in phase 2 CR      Expected Outcomes  Patient will continue to participate in cardiac rehab for exercise, nutrition and lifestyle modifications. Will continue to provide emotional support as needed  Patient will continue to participate in cardiac rehab for exercise, nutrition and lifestyle modifications. Will continue to provide emotional support as needed         Core Components/Risk Factors/Patient Goals at Discharge (Final Review):  Goals and Risk Factor Review - 03/15/18 1614      Core Components/Risk Factors/Patient Goals Review   Personal Goals Review  Weight Management/Obesity;Lipids;Stress    Review  Caide is doing well with exercise. Sergey's vital signs have been stable.  Mylen says his depression is getting better. Rapheal is enjoying participating in phase 2 CR    Expected Outcomes  Patient will continue to participate in cardiac rehab for exercise, nutrition and lifestyle modifications. Will continue to provide emotional support as needed       ITP Comments: ITP Comments    Row Name 02/06/18 0851 02/22/18 1350 03/15/18 1607       ITP Comments  Dr.Traci Radford Pax, Medical Director   30 Day ITP comment. [place] is off to a good start to exercise  30 Day ITP comment. Hernan has good participation and attendance in phase 2 cardiac rehab        Comments: See ITP comments.Barnet Pall, RN,BSN 03/22/2018 10:22 AM

## 2018-03-16 ENCOUNTER — Ambulatory Visit (HOSPITAL_COMMUNITY): Payer: 59

## 2018-03-16 ENCOUNTER — Encounter (HOSPITAL_COMMUNITY)
Admission: RE | Admit: 2018-03-16 | Discharge: 2018-03-16 | Disposition: A | Discharge status: Home / Self Care | Payer: 59 | Source: Ambulatory Visit | Attending: Cardiovascular Disease | Admitting: Cardiovascular Disease

## 2018-03-16 DIAGNOSIS — I214 Non-ST elevation (NSTEMI) myocardial infarction: Secondary | ICD-10-CM

## 2018-03-16 DIAGNOSIS — Z955 Presence of coronary angioplasty implant and graft: Secondary | ICD-10-CM

## 2018-03-19 ENCOUNTER — Ambulatory Visit (HOSPITAL_COMMUNITY): Payer: 59

## 2018-03-19 ENCOUNTER — Encounter (HOSPITAL_COMMUNITY)
Admission: RE | Admit: 2018-03-19 | Discharge: 2018-03-19 | Disposition: A | Discharge status: Home / Self Care | Payer: 59 | Source: Ambulatory Visit | Attending: Cardiovascular Disease | Admitting: Cardiovascular Disease

## 2018-03-19 DIAGNOSIS — I214 Non-ST elevation (NSTEMI) myocardial infarction: Secondary | ICD-10-CM

## 2018-03-19 DIAGNOSIS — Z955 Presence of coronary angioplasty implant and graft: Secondary | ICD-10-CM | POA: Insufficient documentation

## 2018-03-19 MED FILL — REPATHA SURECLICK 140 MG/ML: 140 | 28 days supply | Qty: 2 | Fill #1

## 2018-03-21 ENCOUNTER — Encounter (HOSPITAL_COMMUNITY): Payer: 59

## 2018-03-21 ENCOUNTER — Ambulatory Visit (HOSPITAL_COMMUNITY): Payer: 59

## 2018-03-23 ENCOUNTER — Encounter (HOSPITAL_COMMUNITY)
Admission: RE | Admit: 2018-03-23 | Discharge: 2018-03-23 | Disposition: A | Discharge status: Home / Self Care | Payer: 59 | Source: Ambulatory Visit | Attending: Cardiovascular Disease | Admitting: Cardiovascular Disease

## 2018-03-23 ENCOUNTER — Telehealth: Payer: Self-pay | Admitting: Cardiovascular Disease

## 2018-03-23 ENCOUNTER — Ambulatory Visit (HOSPITAL_COMMUNITY): Payer: 59

## 2018-03-23 DIAGNOSIS — Z955 Presence of coronary angioplasty implant and graft: Secondary | ICD-10-CM | POA: Diagnosis not present

## 2018-03-23 DIAGNOSIS — I214 Non-ST elevation (NSTEMI) myocardial infarction: Secondary | ICD-10-CM

## 2018-03-23 NOTE — Telephone Encounter (Signed)
Spoke with patient who states he asked Verdis Frederickson to check his BP today at cardiac rehab because he notices he is light-headed and dizzy at times. States he does not feel these symptoms during exercise. BP today when he was leaving rehab was 100/50, HR runs 72 bpm. States he feels of dizziness, light-headedness symptoms when he gets up in the morning and is changing positions, noticed it starting a few weeks ago. States he is gaining some weight now and having less nausea and is careful with salt but has not completely eliminated it from diet. We discussed certain changes in medications; he is not anxious to change medications today and would like to wait to hear from Dr. Acie Fredrickson next week. He thanked me for the call.

## 2018-03-23 NOTE — Progress Notes (Signed)
Patient concerned that his blood pressures are running a little too low. Resting blood pressures today 100/50 pre exercise. 100/64 post exercise. Heart rate 67. David Conner denies feeling lightheaded at cardiac rehab but reports feeling a little light headed at times at home when he changes positions in the morning. Will fax exercise flow sheets to Dr. Elmarie Shiley office for review.Barnet Pall, RN,BSN 03/23/2018 4:42 PM

## 2018-03-23 NOTE — Telephone Encounter (Signed)
° °  Maria from Cardiac Rehab calling to report patient is concerned about low BP, no symptoms. Faxing vitals

## 2018-03-26 ENCOUNTER — Ambulatory Visit (HOSPITAL_COMMUNITY): Payer: 59

## 2018-03-26 ENCOUNTER — Encounter (HOSPITAL_COMMUNITY)
Admission: RE | Admit: 2018-03-26 | Discharge: 2018-03-26 | Disposition: A | Discharge status: Home / Self Care | Payer: 59 | Source: Ambulatory Visit | Attending: Cardiovascular Disease | Admitting: Cardiovascular Disease

## 2018-03-26 DIAGNOSIS — I214 Non-ST elevation (NSTEMI) myocardial infarction: Secondary | ICD-10-CM

## 2018-03-26 DIAGNOSIS — Z955 Presence of coronary angioplasty implant and graft: Secondary | ICD-10-CM

## 2018-03-26 MED ORDER — ISOSORBIDE MONONITRATE ER 30 MG PO TB24: 30.0000 mg | ORAL_TABLET | Freq: Every day | ORAL | 3 refills | Status: DC

## 2018-03-26 NOTE — Telephone Encounter (Signed)
Spoke with patient to review Dr. Elmarie Shiley advice. We discussed patient's symptoms of headache, dizziness with position change, and "feeling shaky" and patient agrees to reduce Imdur to 30 mg daily. He is apprehensive about reducing Imdur because of the improvement in angina he has had since starting cardiac rehab and he does not want the angina to return. I advised that the improvement can be due to the improved conditioning of his heart and that he may not require as much Imdur as he needed in the past to control his angina. He agrees to reduce dose of Imdur and continue to monitor BP. He agrees to call back with questions or concerns and expressed gratitude for my call.

## 2018-03-26 NOTE — Telephone Encounter (Signed)
He is on Imdur 60 mg a day . Lets reduce this to 30 mg a day .  This should allow his BP to improve and hopefully will not result in a significant change of his angina

## 2018-03-27 DIAGNOSIS — K219 Gastro-esophageal reflux disease without esophagitis: Secondary | ICD-10-CM | POA: Diagnosis not present

## 2018-03-27 DIAGNOSIS — R1010 Upper abdominal pain, unspecified: Secondary | ICD-10-CM | POA: Diagnosis not present

## 2018-03-27 DIAGNOSIS — K5900 Constipation, unspecified: Secondary | ICD-10-CM | POA: Diagnosis not present

## 2018-03-28 ENCOUNTER — Encounter (HOSPITAL_COMMUNITY)
Admission: RE | Admit: 2018-03-28 | Discharge: 2018-03-28 | Disposition: A | Discharge status: Home / Self Care | Payer: 59 | Source: Ambulatory Visit | Attending: Cardiovascular Disease | Admitting: Cardiovascular Disease

## 2018-03-28 ENCOUNTER — Ambulatory Visit (HOSPITAL_COMMUNITY): Payer: 59

## 2018-03-28 ENCOUNTER — Other Ambulatory Visit: Payer: Self-pay

## 2018-03-28 DIAGNOSIS — Z955 Presence of coronary angioplasty implant and graft: Secondary | ICD-10-CM | POA: Diagnosis not present

## 2018-03-28 DIAGNOSIS — I214 Non-ST elevation (NSTEMI) myocardial infarction: Secondary | ICD-10-CM

## 2018-03-30 ENCOUNTER — Ambulatory Visit (HOSPITAL_COMMUNITY): Payer: 59

## 2018-03-30 ENCOUNTER — Encounter (HOSPITAL_COMMUNITY)
Admission: RE | Admit: 2018-03-30 | Discharge: 2018-03-30 | Disposition: A | Discharge status: Home / Self Care | Payer: 59 | Source: Ambulatory Visit | Attending: Cardiovascular Disease | Admitting: Cardiovascular Disease

## 2018-03-30 ENCOUNTER — Other Ambulatory Visit: Payer: Self-pay

## 2018-03-30 DIAGNOSIS — I214 Non-ST elevation (NSTEMI) myocardial infarction: Secondary | ICD-10-CM

## 2018-03-30 DIAGNOSIS — Z955 Presence of coronary angioplasty implant and graft: Secondary | ICD-10-CM | POA: Diagnosis not present

## 2018-04-02 ENCOUNTER — Ambulatory Visit (HOSPITAL_COMMUNITY): Payer: 59

## 2018-04-02 ENCOUNTER — Telehealth (HOSPITAL_COMMUNITY): Payer: Self-pay | Admitting: *Deleted

## 2018-04-02 ENCOUNTER — Encounter (HOSPITAL_COMMUNITY): Payer: 59

## 2018-04-02 ENCOUNTER — Telehealth (HOSPITAL_COMMUNITY): Payer: Self-pay | Admitting: Family Medicine

## 2018-04-03 MED FILL — ISOSORBIDE MN ER 60 MG TAB: 60 | 30 days supply | Qty: 30 | Fill #2

## 2018-04-03 MED FILL — TESTOSTERONE 20.25 MG/ACT (: 20.25 MG/AC | 30 days supply | Qty: 75 | Fill #2

## 2018-04-03 MED FILL — METOPROLOL SUCCINATE ER 100: 100 | 30 days supply | Qty: 60 | Fill #4

## 2018-04-03 MED FILL — BRILINTA 90 MG TABLET: 90 | 30 days supply | Qty: 60 | Fill #4

## 2018-04-03 MED FILL — ALPRAZolam 0.5 MG TABS: 0.5 | 30 days supply | Qty: 90 | Fill #3

## 2018-04-04 ENCOUNTER — Encounter (HOSPITAL_COMMUNITY): Payer: 59

## 2018-04-04 ENCOUNTER — Ambulatory Visit (HOSPITAL_COMMUNITY): Payer: 59

## 2018-04-06 ENCOUNTER — Ambulatory Visit (HOSPITAL_COMMUNITY): Payer: 59

## 2018-04-06 ENCOUNTER — Encounter (HOSPITAL_COMMUNITY): Payer: Self-pay | Admitting: *Deleted

## 2018-04-06 ENCOUNTER — Encounter (HOSPITAL_COMMUNITY): Payer: 59

## 2018-04-06 DIAGNOSIS — I214 Non-ST elevation (NSTEMI) myocardial infarction: Secondary | ICD-10-CM

## 2018-04-06 DIAGNOSIS — Z955 Presence of coronary angioplasty implant and graft: Secondary | ICD-10-CM

## 2018-04-09 ENCOUNTER — Ambulatory Visit (HOSPITAL_COMMUNITY): Payer: 59

## 2018-04-09 ENCOUNTER — Encounter (HOSPITAL_COMMUNITY): Payer: 59

## 2018-04-09 MED FILL — ESCITALOPRAM 10 MG TABLET: 10 | 30 days supply | Qty: 30 | Fill #1

## 2018-04-10 ENCOUNTER — Encounter (HOSPITAL_COMMUNITY): Payer: Self-pay | Admitting: *Deleted

## 2018-04-10 DIAGNOSIS — I214 Non-ST elevation (NSTEMI) myocardial infarction: Secondary | ICD-10-CM

## 2018-04-10 DIAGNOSIS — Z955 Presence of coronary angioplasty implant and graft: Secondary | ICD-10-CM

## 2018-04-10 MED FILL — ZOLPIDEM TARTRATE 10 MG TAB: 10 | 30 days supply | Qty: 30 | Fill #0

## 2018-04-10 NOTE — Progress Notes (Signed)
Cardiac Individual Treatment Plan  Patient Details  Name: David Conner MRN: [id number] Date of Birth: [birth year] Referring Provider:     CARDIAC REHAB PHASE II ORIENTATION from 02/06/2018 in [place]  Referring Provider  Nahser, Wonda Cheng, MD      Initial Encounter Date:    CARDIAC REHAB PHASE II ORIENTATION from 02/06/2018 in [facility]  Date  02/06/18      Visit Diagnosis: 11/11/2017 NSTEMI (non-ST elevated myocardial infarction) ([place])  12/12/2017 Stented coronary artery S/P DES LAD 11/13/17, S/P DES OM 1 and 2 12/12/17  Patient's Home Medications on Admission:  Current Outpatient Medications:  .  ALPRAZolam (XANAX) 0.5 MG tablet, Take 0.5 mg by mouth 3 (three) times daily. , Disp: , Rfl:  .  aspirin EC 81 MG tablet, Take 81 mg by mouth daily. , Disp: , Rfl:  .  atorvastatin (LIPITOR) 10 MG tablet, Take 1 tablet (10 mg total) by mouth daily. (Patient not taking: Reported on 02/01/2018), Disp: 90 tablet, Rfl: 3 .  escitalopram (LEXAPRO) 5 MG tablet, Take 5 mg by mouth daily., Disp: , Rfl:  .  esomeprazole (NEXIUM) 40 MG capsule, Take 40 mg by mouth daily. , Disp: , Rfl:  .  Evolocumab (REPATHA SURECLICK) 563 MG/ML SOAJ, Inject 1 pen into the skin every 14 (fourteen) days., Disp: 2 pen, Rfl: 11 .  isosorbide mononitrate (IMDUR) 30 MG 24 hr tablet, Take 1 tablet (30 mg total) by mouth daily., Disp: 90 tablet, Rfl: 3 .  metoprolol succinate (TOPROL-XL) 100 MG 24 hr tablet, Take 1 tablet (100 mg total) by mouth 2 (two) times daily. Take with or immediately following a meal., Disp: 180 tablet, Rfl: 3 .  nitroGLYCERIN (NITROSTAT) 0.3 MG SL tablet, Place 1 tablet (0.3 mg total) under the tongue every 5 (five) minutes as needed for chest pain., Disp: 100 tablet, Rfl: 1 .  polyethylene glycol (MIRALAX / GLYCOLAX) packet, Take 17 g by mouth daily., Disp: , Rfl:  .  Testosterone (ANDROGEL PUMP) 20.25 MG/ACT (1.62%) GEL, Place  40.5 mg onto the skin daily. 2 pumps, Disp: , Rfl:  .  ticagrelor (BRILINTA) 90 MG TABS tablet, Take 1 tablet (90 mg total) by mouth 2 (two) times daily., Disp: 180 tablet, Rfl: 9 .  zolpidem (AMBIEN) 10 MG tablet, Take 10 mg by mouth at bedtime., Disp: , Rfl: 1  Past Medical History: Past Medical History:  Diagnosis Date  . Allergy   . Complication of anesthesia    upper gi done, had anxiety with versed  . Coronary artery disease    11/13/17:PCI to mid LAD using Resolute Onyx 3.0 x 18 mm DES  . Depression    slight, on sertraline  . ED (erectile dysfunction)   . GERD (gastroesophageal reflux disease)   . Hyperlipidemia   . Nausea occasional  . Restless legs syndrome     Tobacco Use: Social History   Tobacco Use  Smoking Status Never Smoker  Smokeless Tobacco Never Used    Labs: Recent Review Scientist, physiological    Labs for ITP Cardiac and Pulmonary Rehab Latest Ref Rng & Units 11/11/2017   Cholestrol 0 - 200 mg/dL 191   LDLCALC 0 - 99 mg/dL 142(H)   HDL >40 mg/dL 41   Trlycerides <150 mg/dL 38   Hemoglobin A1c 4.8 - 5.6 % 5.3      Capillary Blood Glucose: No results found for: GLUCAP   Exercise Target Goals: Exercise Program  Goal: Individual exercise prescription set using results from initial 6 min walk test and THRR while considering  patient's activity barriers and safety.   Exercise Prescription Goal: Initial exercise prescription builds to 30-45 minutes a day of aerobic activity, 2-3 days per week.  Home exercise guidelines will be given to patient during program as part of exercise prescription that the participant will acknowledge.  Activity Barriers & Risk Stratification:   6 Minute Walk:   Oxygen Initial Assessment:   Oxygen Re-Evaluation:   Oxygen Discharge (Final Oxygen Re-Evaluation):   Initial Exercise Prescription:   Perform Capillary Blood Glucose checks as needed.  Exercise Prescription Changes: Exercise Prescription Changes    Row  Name 02/12/18 1449 02/26/18 1450 03/12/18 1451 03/26/18 1453 03/30/18 1456     Response to Exercise   Blood Pressure (Admit)  118/72  104/64  122/70  102/60  102/72   Blood Pressure (Exercise)  138/82  118/70  122/80  108/70  118/78   Blood Pressure (Exit)  98/70  108/76  98/72  104/70  106/68   Heart Rate (Admit)  93 bpm  62 bpm  79 bpm  63 bpm  74 bpm   Heart Rate (Exercise)  119 bpm  104 bpm  117 bpm  115 bpm  115 bpm   Heart Rate (Exit)  90 bpm  62 bpm  79 bpm  63 bpm  74 bpm   Rating of Perceived Exertion (Exercise)  10  12  13  12  12    Symptoms  none  none  none  none  none   Duration  Progress to 30 minutes of  aerobic without signs/symptoms of physical distress  Progress to 30 minutes of  aerobic without signs/symptoms of physical distress  Progress to 30 minutes of  aerobic without signs/symptoms of physical distress  Progress to 30 minutes of  aerobic without signs/symptoms of physical distress  Progress to 30 minutes of  aerobic without signs/symptoms of physical distress   Intensity  THRR unchanged  THRR unchanged  THRR unchanged  THRR unchanged  THRR unchanged     Progression   Progression  Continue to progress workloads to maintain intensity without signs/symptoms of physical distress.  Continue to progress workloads to maintain intensity without signs/symptoms of physical distress.  Continue to progress workloads to maintain intensity without signs/symptoms of physical distress.  Continue to progress workloads to maintain intensity without signs/symptoms of physical distress.  Continue to progress workloads to maintain intensity without signs/symptoms of physical distress.   Average METs  3.4  3.9  4.5  4.6  4.6     Resistance Training   Training Prescription  Yes  Yes  Yes  Yes  Yes   Weight  4lbs  4lbs  4lbs  4lbs  4lbs   Reps  10-15  10-15  10-15  10-15  10-15   Time  10 Minutes  10 Minutes  10 Minutes  10 Minutes  10 Minutes     Interval Training   Interval Training  No   No  No  No  No     Treadmill   MPH  3  3.2  3.2  3.2  3.2   Grade  1  1  3  3  3    Minutes  10  10  10  10  10    METs  3.71  3.89  4.77  4.77  4.77     Bike   Level  1.5  1.8  2.3  2.3  2.3   Minutes  10  10  10  10  10    METs  4.17  4.82  5.7  5.67  5.62     NuStep   Level  3  5  5  5  5    SPM  85  85  85  85  85   Minutes  10  10  10  10  10    METs  2.3  2.9  3.1  3.5  3.3     Home Exercise Plan   Plans to continue exercise at  -  Home (comment) Treadmill, walking  Home (comment) Treadmill, walking  Home (comment) Treadmill, walking  Home (comment) Treadmill, walking   Frequency  -  Add 2 additional days to program exercise sessions.  Add 2 additional days to program exercise sessions.  Add 2 additional days to program exercise sessions.  Add 2 additional days to program exercise sessions.   Initial Home Exercises Provided  -  02/19/18  02/19/18  02/19/18  02/19/18      Exercise Comments: Exercise Comments    Row Name 02/12/18 1548 02/19/18 1534 02/26/18 1513 03/12/18 1521 03/26/18 1523   Exercise Comments  Patient tolerated first session of exercise well without symptoms.  Reviewed home exercise guidelines, METs, and goals with patient.  Reviewed METs and goals with patient.  METs reviewed with patient.  Reviewed METs and goals with patient.      Exercise Goals and Review:   Exercise Goals Re-Evaluation : Exercise Goals Re-Evaluation    Row Name 02/12/18 1548 02/19/18 1534 02/26/18 1513 03/26/18 1523 04/04/18 1400     Exercise Goal Re-Evaluation   Exercise Goals Review  Increase Physical Activity;Able to understand and use rate of perceived exertion (RPE) scale  Increase Physical Activity;Able to understand and use rate of perceived exertion (RPE) scale;Understanding of Exercise Prescription;Knowledge and understanding of Target Heart Rate Range (THRR)  Increase Physical Activity;Able to understand and use rate of perceived exertion (RPE) scale;Understanding of Exercise  Prescription;Knowledge and understanding of Target Heart Rate Range (THRR);Increase Strength and Stamina  Increase Physical Activity;Able to understand and use rate of perceived exertion (RPE) scale;Understanding of Exercise Prescription;Knowledge and understanding of Target Heart Rate Range (THRR);Increase Strength and Stamina  -   Comments  Patient able to understand and use RPE scale appropriately.  Reviewed home exercise guidelines with patient including THRR, RPE scale, and endpoints for exercise. Pt is walking 20-30 minutes, 2-3 days/week on his treadmill at home.  Patient continuing to exercise at least 1-2 days/week at home on his TM. Pt is having no angina with exercise.  Pt states he's "feeling good" with exercise routine and is exercising without angina. Pt is walking on his TM at home 30 minutes, 1-2 days/week.  Temporary department closure due to COVID-19.   Expected Outcomes  Increase workloads as tolerated to help improve cardiorespiratory fitness.  Patient will continue to exercise 20-29minutes, 5-6 days/week to help achieve personal health and fitness goals.  Increase workloads as tolerated to help improve strength and stamina.  Continue to work on progressing workloads.  -      Discharge Exercise Prescription (Final Exercise Prescription Changes): Exercise Prescription Changes - 03/30/18 1456      Response to Exercise   Blood Pressure (Admit)  102/72    Blood Pressure (Exercise)  118/78    Blood Pressure (Exit)  106/68    Heart Rate (Admit)  74 bpm    Heart Rate (Exercise)  115  bpm    Heart Rate (Exit)  74 bpm    Rating of Perceived Exertion (Exercise)  12    Symptoms  none    Duration  Progress to 30 minutes of  aerobic without signs/symptoms of physical distress    Intensity  THRR unchanged      Progression   Progression  Continue to progress workloads to maintain intensity without signs/symptoms of physical distress.    Average METs  4.6      Resistance Training    Training Prescription  Yes    Weight  4lbs    Reps  10-15    Time  10 Minutes      Interval Training   Interval Training  No      Treadmill   MPH  3.2    Grade  3    Minutes  10    METs  4.77      Bike   Level  2.3    Minutes  10    METs  5.62      NuStep   Level  5    SPM  85    Minutes  10    METs  3.3      Home Exercise Plan   Plans to continue exercise at  Home (comment)   Treadmill, walking   Frequency  Add 2 additional days to program exercise sessions.    Initial Home Exercises Provided  02/19/18       Nutrition:  Target Goals: Understanding of nutrition guidelines, daily intake of sodium 1500mg , cholesterol 200mg , calories 30% from fat and 7% or less from saturated fats, daily to have 5 or more servings of fruits and vegetables.  Biometrics:    Nutrition Therapy Plan and Nutrition Goals: Nutrition Therapy & Goals - 03/02/18 1440      Nutrition Therapy   Diet  heart healthy      Personal Nutrition Goals   Nutrition Goal  Pt to identify and limit food sources of sodium, saturated fat, trans fat, and refined carbohydrates    Personal Goal #2  Pt to build a healthy plate    Personal Goal #3  Pt to weigh and measure portion sizes for accuracy       Intervention Plan   Intervention  Prescribe, educate and counsel regarding individualized specific dietary modifications aiming towards targeted core components such as weight, hypertension, lipid management, diabetes, heart failure and other comorbidities.    Expected Outcomes  Short Term Goal: Understand basic principles of dietary content, such as calories, fat, sodium, cholesterol and nutrients.;Long Term Goal: Adherence to prescribed nutrition plan.       Nutrition Assessments:   Nutrition Goals Re-Evaluation:   Nutrition Goals Re-Evaluation:   Nutrition Goals Discharge (Final Nutrition Goals Re-Evaluation):   Psychosocial: Target Goals: Acknowledge presence or absence of significant  depression and/or stress, maximize coping skills, provide positive support system. Participant is able to verbalize types and ability to use techniques and skills needed for reducing stress and depression.  Initial Review & Psychosocial Screening:   Quality of Life Scores: Quality of Life - 02/12/18 1704      Quality of Life   Select  Quality of Life      Quality of Life Scores   Health/Function Pre  18.7 %    Socioeconomic Pre  22.86 %    Psych/Spiritual Pre  13.21 %    Family Pre  29.5 %    GLOBAL Pre  20.01 %  Scores of 19 and below usually indicate a poorer quality of life in these areas.  A difference of  2-3 points is a clinically meaningful difference.  A difference of 2-3 points in the total score of the Quality of Life Index has been associated with significant improvement in overall quality of life, self-image, physical symptoms, and general health in studies assessing change in quality of life.  PHQ-9: Recent Review Flowsheet Data    Depression screen [facility] 2/9 02/12/2018   Decreased Interest 2   PHQ - 2 Score 2   Altered sleeping 1   Tired, decreased energy 1   Change in appetite 1   Feeling bad or failure about yourself  1   Trouble concentrating 0   Moving slowly or fidgety/restless 0   Suicidal thoughts 0   PHQ-9 Score 6   Difficult doing work/chores Somewhat difficult     Interpretation of Total Score  Total Score Depression Severity:  1-4 = Minimal depression, 5-9 = Mild depression, 10-14 = Moderate depression, 15-19 = Moderately severe depression, 20-27 = Severe depression   Psychosocial Evaluation and Intervention:   Psychosocial Re-Evaluation: Psychosocial Re-Evaluation    [place] Name 02/22/18 1351 03/15/18 1609 04/06/18 1211         Psychosocial Re-Evaluation   Current issues with  Current Anxiety/Panic;Current Depression  Current Anxiety/Panic;Current Depression  Current Anxiety/Panic;Current Depression     Comments  Marvyn continues to receive  counseling and has recently started taking Lexapro  Caidan continues to receive counseling. Coleson is doing well on his current dose of antidepressant.   Roarke continues to receive counseling. Aaliyah is doing well on his current dose of antidepressant. Keo's antianxiety medicaiton is being decreased. Patient was doing well as on 03/30/18. Cardiac rehab is on hold due to COVID-19 concerns     Expected Outcomes  Damien will have decreased anxiety and depression upon completion of phase 2 cardiac rehab  Ruvim will have decreased anxiety and depression upon completion of phase 2 cardiac rehab  -     Interventions  Stress management education;Relaxation education;Encouraged to attend Cardiac Rehabilitation for the exercise Shanon Brow is currently seeing a therapist and a psychiatrist  Stress management education;Relaxation education;Encouraged to attend Cardiac Rehabilitation for the exercise  -     Continue Psychosocial Services   Follow up required by staff  Follow up required by staff  -        Psychosocial Discharge (Final Psychosocial Re-Evaluation): Psychosocial Re-Evaluation - 04/06/18 1211      Psychosocial Re-Evaluation   Current issues with  Current Anxiety/Panic;Current Depression    Comments  Worth continues to receive counseling. Dai is doing well on his current dose of antidepressant. Abdurrahman's antianxiety medicaiton is being decreased. Patient was doing well as on 03/30/18. Cardiac rehab is on hold due to COVID-19 concerns       Vocational Rehabilitation: Provide vocational rehab assistance to qualifying candidates.   Vocational Rehab Evaluation & Intervention:   Education: Education Goals: Education classes will be provided on a weekly basis, covering required topics. Participant will state understanding/return demonstration of topics presented.  Learning Barriers/Preferences:   Education Topics: Count Your Pulse:  -Group instruction provided by verbal instruction,  demonstration, patient participation and written materials to support subject.  Instructors address importance of being able to find your pulse and how to count your pulse when at home without a heart monitor.  Patients get hands on experience counting their pulse with staff help and individually.   Heart Attack, Angina,  and Risk Factor Modification:  -Group instruction provided by verbal instruction, video, and written materials to support subject.  Instructors address signs and symptoms of angina and heart attacks.    Also discuss risk factors for heart disease and how to make changes to improve heart health risk factors.   Functional Fitness:  -Group instruction provided by verbal instruction, demonstration, patient participation, and written materials to support subject.  Instructors address safety measures for doing things around the house.  Discuss how to get up and down off the floor, how to pick things up properly, how to safely get out of a chair without assistance, and balance training.   Meditation and Mindfulness:  -Group instruction provided by verbal instruction, patient participation, and written materials to support subject.  Instructor addresses importance of mindfulness and meditation practice to help reduce stress and improve awareness.  Instructor also leads participants through a meditation exercise.    Stretching for Flexibility and Mobility:  -Group instruction provided by verbal instruction, patient participation, and written materials to support subject.  Instructors lead participants through series of stretches that are designed to increase flexibility thus improving mobility.  These stretches are additional exercise for major muscle groups that are typically performed during regular warm up and cool down.   Hands Only CPR:  -Group verbal, video, and participation provides a basic overview of AHA guidelines for community CPR. Role-play of emergencies allow participants  the opportunity to practice calling for help and chest compression technique with discussion of AED use.   Hypertension: -Group verbal and written instruction that provides a basic overview of hypertension including the most recent diagnostic guidelines, risk factor reduction with self-care instructions and medication management.    Nutrition I class: Heart Healthy Eating:  -Group instruction provided by PowerPoint slides, verbal discussion, and written materials to support subject matter. The instructor gives an explanation and review of the Therapeutic Lifestyle Changes diet recommendations, which includes a discussion on lipid goals, dietary fat, sodium, fiber, plant stanol/sterol esters, sugar, and the components of a well-balanced, healthy diet.   Nutrition II class: Lifestyle Skills:  -Group instruction provided by PowerPoint slides, verbal discussion, and written materials to support subject matter. The instructor gives an explanation and review of label reading, grocery shopping for heart health, heart healthy recipe modifications, and ways to make healthier choices when eating out.   Diabetes Question & Answer:  -Group instruction provided by PowerPoint slides, verbal discussion, and written materials to support subject matter. The instructor gives an explanation and review of diabetes co-morbidities, pre- and post-prandial blood glucose goals, pre-exercise blood glucose goals, signs, symptoms, and treatment of hypoglycemia and hyperglycemia, and foot care basics.   Diabetes Blitz:  -Group instruction provided by PowerPoint slides, verbal discussion, and written materials to support subject matter. The instructor gives an explanation and review of the physiology behind type 1 and type 2 diabetes, diabetes medications and rational behind using different medications, pre- and post-prandial blood glucose recommendations and Hemoglobin A1c goals, diabetes diet, and exercise including blood  glucose guidelines for exercising safely.    Portion Distortion:  -Group instruction provided by PowerPoint slides, verbal discussion, written materials, and food models to support subject matter. The instructor gives an explanation of serving size versus portion size, changes in portions sizes over the last 20 years, and what consists of a serving from each food group.   Stress Management:  -Group instruction provided by verbal instruction, video, and written materials to support subject matter.  Instructors review role  of stress in heart disease and how to cope with stress positively.     CARDIAC REHAB PHASE II EXERCISE from 03/14/2018 in [place]  Date  02/14/18  Educator  RN  Instruction Review Code  2- Demonstrated Understanding      Exercising on Your Own:  -Group instruction provided by verbal instruction, power point, and written materials to support subject.  Instructors discuss benefits of exercise, components of exercise, frequency and intensity of exercise, and end points for exercise.  Also discuss use of nitroglycerin and activating EMS.  Review options of places to exercise outside of rehab.  Review guidelines for sex with heart disease.   CARDIAC REHAB PHASE II EXERCISE from 03/14/2018 in [place]  Date  03/14/18  Instruction Review Code  2- Demonstrated Understanding      Cardiac Drugs I:  -Group instruction provided by verbal instruction and written materials to support subject.  Instructor reviews cardiac drug classes: antiplatelets, anticoagulants, beta blockers, and statins.  Instructor discusses reasons, side effects, and lifestyle considerations for each drug class.   Cardiac Drugs II:  -Group instruction provided by verbal instruction and written materials to support subject.  Instructor reviews cardiac drug classes: angiotensin converting enzyme inhibitors (ACE-I), angiotensin II receptor blockers  (ARBs), nitrates, and calcium channel blockers.  Instructor discusses reasons, side effects, and lifestyle considerations for each drug class.   CARDIAC REHAB PHASE II EXERCISE from 03/14/2018 in [place]  Date  02/28/18  Instruction Review Code  2- Demonstrated Understanding      Anatomy and Physiology of the Circulatory System:  Group verbal and written instruction and models provide basic cardiac anatomy and physiology, with the coronary electrical and arterial systems. Review of: AMI, Angina, Valve disease, Heart Failure, Peripheral Artery Disease, Cardiac Arrhythmia, Pacemakers, and the ICD.   CARDIAC REHAB PHASE II EXERCISE from 03/14/2018 in [place]  Date  02/21/18  Instruction Review Code  2- Demonstrated Understanding      Other Education:  -Group or individual verbal, written, or video instructions that support the educational goals of the cardiac rehab program.   Holiday Eating Survival Tips:  -Group instruction provided by PowerPoint slides, verbal discussion, and written materials to support subject matter. The instructor gives patients tips, tricks, and techniques to help them not only survive but enjoy the holidays despite the onslaught of food that accompanies the holidays.   Knowledge Questionnaire Score:   Core Components/Risk Factors/Patient Goals at Admission:   Core Components/Risk Factors/Patient Goals Review:  Goals and Risk Factor Review    Row Name 02/22/18 1404 03/15/18 1614 04/06/18 1214         Core Components/Risk Factors/Patient Goals Review   Personal Goals Review  Weight Management/Obesity;Lipids;Stress  Weight Management/Obesity;Lipids;Stress  Weight Management/Obesity;Lipids;Stress     Review  Jeremiah is doing well with exercise. Wladyslaw's vital signs have been stable.  Talor has not voiced having any increased depression or anxiety  Goodwin is doing well with exercise.  Kellar's vital signs have been stable.  Eriq says his depression is getting better. Emmanuel is enjoying participating in phase 2 CR  Exercise is currently on hold as the department is closed per reccomendations from the federal government to prevent the spread of COVID-19     Expected Outcomes  Patient will continue to participate in cardiac rehab for exercise, nutrition and lifestyle modifications. Will continue to provide emotional support as needed  Patient will continue to participate in cardiac rehab for exercise, nutrition and lifestyle modifications. Will continue to provide emotional support as needed  Patient will continue to participate in cardiac rehab for exercise, nutrition and lifestyle modifications when exercise resumes        Core Components/Risk Factors/Patient Goals at Discharge (Final Review):  Goals and Risk Factor Review - 04/06/18 1214      Core Components/Risk Factors/Patient Goals Review   Personal Goals Review  Weight Management/Obesity;Lipids;Stress    Review  Exercise is currently on hold as the department is closed per reccomendations from the federal government to prevent the spread of COVID-19    Expected Outcomes  Patient will continue to participate in cardiac rehab for exercise, nutrition and lifestyle modifications when exercise resumes       ITP Comments: ITP Comments    Row Name 02/22/18 1350 03/15/18 1607 04/06/18 1208       ITP Comments  30 Day ITP comment. Aadvik is off to a good start to exercise  30 Day ITP comment. Mujtaba has good participation and attendance in phase 2 cardiac rehab  30 Day ITP comment.  Exercise currently on hold as department is closed per reccomended guidelines from the federal governement to prevent the spread of COVID-19        Comments: See ITP comments.Barnet Pall, RN,BSN 04/10/2018 3:31 PM

## 2018-04-11 ENCOUNTER — Encounter (HOSPITAL_COMMUNITY): Payer: 59

## 2018-04-11 ENCOUNTER — Ambulatory Visit (HOSPITAL_COMMUNITY): Payer: 59

## 2018-04-13 ENCOUNTER — Ambulatory Visit (HOSPITAL_COMMUNITY): Payer: 59

## 2018-04-13 ENCOUNTER — Encounter (HOSPITAL_COMMUNITY): Payer: 59

## 2018-04-16 ENCOUNTER — Ambulatory Visit (HOSPITAL_COMMUNITY): Payer: 59

## 2018-04-16 ENCOUNTER — Encounter (HOSPITAL_COMMUNITY): Payer: 59

## 2018-04-16 MED FILL — ESCITALOPRAM 20 MG TABLET: 20 | 30 days supply | Qty: 30 | Fill #0

## 2018-04-16 MED FILL — REPATHA SURECLICK 140 MG/ML: 140 | 28 days supply | Qty: 2 | Fill #2

## 2018-04-18 ENCOUNTER — Ambulatory Visit (HOSPITAL_COMMUNITY): Payer: 59

## 2018-04-18 ENCOUNTER — Encounter (HOSPITAL_COMMUNITY): Payer: 59

## 2018-04-18 DIAGNOSIS — Z0000 Encounter for general adult medical examination without abnormal findings: Secondary | ICD-10-CM | POA: Diagnosis not present

## 2018-04-18 DIAGNOSIS — E291 Testicular hypofunction: Secondary | ICD-10-CM | POA: Diagnosis not present

## 2018-04-18 DIAGNOSIS — E7800 Pure hypercholesterolemia, unspecified: Secondary | ICD-10-CM | POA: Diagnosis not present

## 2018-04-18 DIAGNOSIS — Z125 Encounter for screening for malignant neoplasm of prostate: Secondary | ICD-10-CM | POA: Diagnosis not present

## 2018-04-20 ENCOUNTER — Ambulatory Visit (HOSPITAL_COMMUNITY): Payer: 59

## 2018-04-20 ENCOUNTER — Encounter (HOSPITAL_COMMUNITY): Payer: 59

## 2018-04-23 ENCOUNTER — Ambulatory Visit (HOSPITAL_COMMUNITY): Payer: 59

## 2018-04-23 ENCOUNTER — Encounter (HOSPITAL_COMMUNITY): Payer: 59

## 2018-04-25 ENCOUNTER — Encounter (HOSPITAL_COMMUNITY): Payer: 59

## 2018-04-25 ENCOUNTER — Ambulatory Visit (HOSPITAL_COMMUNITY): Payer: 59

## 2018-04-27 ENCOUNTER — Telehealth (HOSPITAL_COMMUNITY): Payer: Self-pay | Admitting: *Deleted

## 2018-04-27 ENCOUNTER — Ambulatory Visit (HOSPITAL_COMMUNITY): Payer: 59

## 2018-04-27 ENCOUNTER — Encounter (HOSPITAL_COMMUNITY): Payer: 59

## 2018-04-27 NOTE — Telephone Encounter (Signed)
Called to notify patient that the cardiac and pulmonary rehabilitation department remains closed at this time due to COVID-19 restrictions. Pt verbalized understanding.Pt is walking at home at this time as his mode of exercise.  Sol Passer, MS, ACSM CEP 04/27/2018 1259

## 2018-04-30 ENCOUNTER — Encounter (HOSPITAL_COMMUNITY): Payer: 59

## 2018-04-30 ENCOUNTER — Ambulatory Visit (HOSPITAL_COMMUNITY): Payer: 59

## 2018-05-01 ENCOUNTER — Telehealth: Payer: Self-pay | Admitting: Cardiovascular Disease

## 2018-05-01 NOTE — Telephone Encounter (Signed)
Spoke with patient who confirmed all demographics.  He currently uses My Chart and has webex downloaded to his computer.  He said that he will call me this afternoon or tomorrow morning to do a "trial run" so that his visit goes smoothly.

## 2018-05-02 ENCOUNTER — Ambulatory Visit (HOSPITAL_COMMUNITY): Payer: 59

## 2018-05-02 ENCOUNTER — Encounter (HOSPITAL_COMMUNITY): Payer: 59

## 2018-05-04 ENCOUNTER — Telehealth: Payer: Self-pay | Admitting: Cardiovascular Disease

## 2018-05-04 ENCOUNTER — Telehealth (INDEPENDENT_AMBULATORY_CARE_PROVIDER_SITE_OTHER): Payer: 59 | Admitting: Cardiovascular Disease

## 2018-05-04 ENCOUNTER — Ambulatory Visit: Payer: 59 | Admitting: Cardiovascular Disease

## 2018-05-04 ENCOUNTER — Other Ambulatory Visit: Payer: Self-pay

## 2018-05-04 ENCOUNTER — Encounter (HOSPITAL_COMMUNITY): Payer: 59

## 2018-05-04 ENCOUNTER — Encounter: Payer: Self-pay | Admitting: Cardiovascular Disease

## 2018-05-04 ENCOUNTER — Other Ambulatory Visit: Payer: 59

## 2018-05-04 VITALS — BP 123/84 | HR 66 | Ht 70.5 in | Wt 210.6 lb

## 2018-05-04 DIAGNOSIS — I10 Essential (primary) hypertension: Secondary | ICD-10-CM | POA: Diagnosis not present

## 2018-05-04 DIAGNOSIS — I2510 Atherosclerotic heart disease of native coronary artery without angina pectoris: Secondary | ICD-10-CM | POA: Diagnosis not present

## 2018-05-04 DIAGNOSIS — Z7189 Other specified counseling: Secondary | ICD-10-CM

## 2018-05-04 DIAGNOSIS — E782 Mixed hyperlipidemia: Secondary | ICD-10-CM

## 2018-05-04 DIAGNOSIS — I214 Non-ST elevation (NSTEMI) myocardial infarction: Secondary | ICD-10-CM | POA: Diagnosis not present

## 2018-05-04 DIAGNOSIS — Z9861 Coronary angioplasty status: Secondary | ICD-10-CM

## 2018-05-04 MED ORDER — METOPROLOL SUCCINATE ER 50 MG PO TB24: ORAL_TABLET | ORAL | 3 refills | Status: DC

## 2018-05-04 MED ORDER — METOPROLOL SUCCINATE ER 100 MG PO TB24: ORAL_TABLET | ORAL | 3 refills | Status: DC

## 2018-05-04 MED FILL — METOPROLOL SUCCINATE ER 100: 100 | 30 days supply | Qty: 30 | Fill #0

## 2018-05-04 MED FILL — METOPROLOL SUCCINATE ER 50: 50 | 30 days supply | Qty: 30 | Fill #0

## 2018-05-04 NOTE — Progress Notes (Signed)
.    Virtual Visit via Video Note   This visit type was conducted due to national recommendations for restrictions regarding the COVID-19 Pandemic (e.g. social distancing) in an effort to limit this patient's exposure and mitigate transmission in our community.  Due to his co-morbid illnesses, this patient is at least at moderate risk for complications without adequate follow up.  This format is felt to be most appropriate for this patient at this time.  All issues noted in this document were discussed and addressed.  A limited physical exam was performed with this format.  Please refer to the patient's chart for his consent to telehealth for [facility].   Evaluation Performed:  Follow-up visit  Date:  05/04/2018   ID:  VERLAND SPRINKLE, DOB Mar 06, 1960, MRN [id number]  Patient Location: Home Provider Location: Home  PCP:  Orpah Melter, MD  Cardiologist:  Mertie Moores, MD  Electrophysiologist:  None   Chief Complaint:  CAD  History of Present Illness:    WAYNE WICKLUND is a 58 y.o. male with CAD .  Simeon is seen back today for follow up visit He is still consumed by his diagnosis.   Seems to have lots of anxiety.  He has had stenting of his mid LAD Oct. 28, 2019.  The terminal LAD remains very small and severely diseased and is not a good target for revascularization Because of his numerous and sometimes conflicting symptoms, we scheduled a GXT He walked for 9 minutes of a Bruce protocol .  Achieved a peak HR of 148 ( 90% predicted max HR ) and had no angina symptoms and no ECG changes.  He stopped due to fagiue and dyspnea.  We enrolled him in cardiac rehab but he has not started yet     He continued to have angina and returned to the cath lab on Nov. 26, 2019 and had stenting of his OM1 and OM2.  Because of his continued symptoms, we started Ranexa which is tolerated very poorly and has stopped.  He is unable to eat and has lost weight. We have held is  Rosuvastatin to see if this helps with the anorexia.    He has been evaluated by Greer Pickerel of [facility] surgery and has a CT angio of the abdomen and pelvis scheduled tomorrow  to look for bowel ischemia .   Has intermittant sharp pain in his chest .   Typically if he does some exercise for a prolonged time - 30 min - 1 hr.  Relieved with Xanax  We tried Ranexa but caused him significant GI pain , nausea  We have tried higher dose Imdur 60 mg - has not tried to walk on his own at this time   Takes Xanax 3 times a day .    Has lost 30 lbs  Has also been given Certriline but he became very resless and so he went back to the xanax .   Has been referred to a psychiatrist   He previously has complained of  lack of appetite and subsequent fatigue  ________________________________________- May 04, 2018   Since I last saw him in Jan. , he has been participating in cardiac rehab and is doing very well. Has completed 6 of his 12 weeks of cardiac rehab. Is exercising at home  Walks at home - 30 minutes a day at 3 miles / hr. Also does physical work No chest pain .  He thinks most of his previous issues were  anxiety Is on Lexapro now instead  Xanax.  Appetite is better, nausea has resolved   Recent labs  Chol = 98 HDL = 50 LDL = 32 Trigs = 83  BP has been generally lower   Is social distancing, Wearing a mask when he goes goes out   The patient does not have symptoms concerning for COVID-19 infection (fever, chills, cough, or new shortness of breath).      Past Medical History:  Diagnosis Date  . Allergy   . Complication of anesthesia    upper gi done, had anxiety with versed  . Coronary artery disease    11/13/17:PCI to mid LAD using Resolute Onyx 3.0 x 18 mm DES  . Depression    slight, on sertraline  . ED (erectile dysfunction)   . GERD (gastroesophageal reflux disease)   . Hyperlipidemia   . Nausea occasional  . Restless legs syndrome    Past  Surgical History:  Procedure Laterality Date  . CARDIAC CATHETERIZATION    . CHOLECYSTECTOMY N/A 02/18/2013   Procedure: LAPAROSCOPIC CHOLECYSTECTOMY WITH INTRAOPERATIVE CHOLANGIOGRAM, DIAGNOSTIC LAPAROSCOPY, LYSIS OF ADHESIONS ;  Surgeon: [name], MD;  Location: [facility];  Service: General;  Laterality: N/A;  . colonscopy  2 years ago  . CORONARY STENT INTERVENTION  11/13/2017  . CORONARY STENT INTERVENTION N/A 11/13/2017   Procedure: CORONARY STENT INTERVENTION;  Surgeon: Nelva Bush, MD;  Location: [place] [facility];  Service: Cardiovascular;  Laterality: N/A;  . cortisone injection Left 3 weeks ago   dr thinks is bursitis  . esophagus stretching     7-8 times  . LAPAROSCOPIC APPENDECTOMY N/A 02/18/2013   Procedure: APPENDECTOMY LAPAROSCOPIC;  Surgeon: [name], MD;  Location: [facility];  Service: General;  Laterality: N/A;  DR Redmond Pulling SPOKE WITH PT FAMILY DURING SURGERY ABOUT REMOVING PT APPENDIX DUE TO MALROTATION. FAMILY STATED TO PROCEDE WITH REMOVAL OF APPENDIX.   [name] LEFT HEART CATH AND CORONARY ANGIOGRAPHY N/A 11/13/2017   Procedure: LEFT HEART CATH AND CORONARY ANGIOGRAPHY;  Surgeon: Nelva Bush, MD;  Location: [place] [facility];  Service: Cardiovascular;  Laterality: N/A;  . LEFT HEART CATH AND CORONARY ANGIOGRAPHY N/A 12/12/2017   Procedure: LEFT HEART CATH AND CORONARY ANGIOGRAPHY;  Surgeon: Martinique, Peter M, MD;  Location: [place] [facility];  Service: Cardiovascular;  Laterality: N/A;     Current Meds  Medication Sig  . ALPRAZolam (XANAX) 0.5 MG tablet Take 0.5 mg by mouth at bedtime as needed for anxiety.  [name] aspirin EC 81 MG tablet Take 81 mg by mouth daily.   [name] escitalopram (LEXAPRO) 20 MG tablet Take 20 mg by mouth daily.  [name] esomeprazole (NEXIUM) 40 MG capsule Take 40 mg by mouth daily.   . Evolocumab (REPATHA SURECLICK) 740 MG/ML SOAJ Inject 1 pen into the skin every 14 (fourteen) days.  . isosorbide mononitrate (IMDUR) 60 MG 24 hr tablet Take 60 mg by mouth  daily.  . metoprolol succinate (TOPROL-XL) 100 MG 24 hr tablet Take 1 tablet (100 mg total) by mouth 2 (two) times daily. Take with or immediately following a meal.  . nitroGLYCERIN (NITROSTAT) 0.3 MG SL tablet Place 1 tablet (0.3 mg total) under the tongue every 5 (five) minutes as needed for chest pain.  . Testosterone (ANDROGEL PUMP) 20.25 MG/ACT (1.62%) GEL Place 40.5 mg onto the skin daily. 2 pumps  . ticagrelor (BRILINTA) 90 MG TABS tablet Take 1 tablet (90 mg total) by mouth 2 (two) times daily.  [name] zolpidem (  AMBIEN) 10 MG tablet Take 10 mg by mouth at bedtime.     Allergies:   Lansoprazole; Ofloxacin; Penicillin g; and Versed [midazolam]   Social History   Tobacco Use  . Smoking status: Never Smoker  . Smokeless tobacco: Never Used  Substance Use Topics  . Alcohol use: No  . Drug use: No     Family Hx: The patient's family history includes Bladder Cancer in his father; CAD in his maternal grandfather and mother; Diabetes in his father; Healthy in his sister; Hyperlipidemia in his mother; Hypertension in his father and mother.  ROS:   Please see the history of present illness.     All other systems reviewed and are negative.   Prior CV studies:   The following studies were reviewed today:    Labs/Other Tests and Data Reviewed:    EKG:  No ECG reviewed.  Recent Labs: 12/29/2017: ALT 52; BUN 8; Creatinine, Ser 1.08; Hemoglobin 15.7; Platelets 279; Potassium 4.0; Sodium 142   Recent Lipid Panel Lab Results  Component Value Date/Time   CHOL 191 11/11/2017 08:43 PM   TRIG 38 11/11/2017 08:43 PM   HDL 41 11/11/2017 08:43 PM   CHOLHDL 4.7 11/11/2017 08:43 PM   LDLCALC 142 (H) 11/11/2017 08:43 PM    Wt Readings from Last 3 Encounters:  05/04/18 210 lb 9.6 oz (95.5 kg)  02/06/18 198 lb 13.7 oz (90.2 kg)  02/01/18 198 lb 12.8 oz (90.2 kg)     Objective:    Vital Signs:  BP 123/84 (BP Location: Left Arm, Patient Position: Sitting, Cuff Size: Normal)   Pulse 66    Ht 5' 10.5" (1.791 m)   Wt 210 lb 9.6 oz (95.5 kg)   BMI 29.79 kg/m    VITAL SIGNS:  reviewed GEN:  no acute distress EYES:  sclerae anicteric, EOMI - Extraocular Movements Intact RESPIRATORY:  normal respiratory effort, symmetric expansion CARDIOVASCULAR:  no peripheral edema SKIN:  no rash, lesions or ulcers. MUSCULOSKELETAL:  no obvious deformities. NEURO:  alert and oriented x 3, no obvious focal deficit PSYCH:  normal affect  ASSESSMENT & PLAN:    1. CAD :  Is overall doing well He has had some episodes of headaches and low  blood pressure.  He has noticed that his headache seems to be worse when his blood pressure is very low.  Last week he try cutting back on his isosorbide and this did result in a slightly higher blood pressure but he also noticed some uneasiness in his chest with exercise.  Increase the isosorbide back to 60 mg a day and is feeling better from an angina standpoint.  We will try decreasing his dose of metoprolol succinate.  He will take 100 mg of Toprol-XL in the morning and 50 mg  at night.  We will get back to [place] via MyChart next week to see how this is going.  Continue Brilinta 90 mg twice a day.  Continue aspirin 81 mg a day.  Continue isosorbide mononitrate 60 mg a day He was told that the remaining cardiac rehab appointments have been canceled.  He is exercising at home and is overall doing quite well.  I will see him again in 4 months for a follow-up office visit.  2.  Hyperlipidemia: He is on Repatha after his last heart quite good.  His HDL is 50.  His total cholesterol  98.  Triglyceride levels 83.  His LDL is 32.  Continue with his current medications.  COVID-19  Education: The signs and symptoms of COVID-19 were discussed with the patient and how to seek care for testing (follow up with PCP or arrange E-visit).  The importance of social distancing was discussed today.  Time:   Today, I have spent 26  minutes with the patient with telehealth  technology discussing the above problems.  Additional 12 minutes charting and pre-charting .    Medication Adjustments/Labs and Tests Ordered: Current medicines are reviewed at length with the patient today.  Concerns regarding medicines are outlined above.   Tests Ordered: No orders of the defined types were placed in this encounter.   Medication Changes: No orders of the defined types were placed in this encounter.   Disposition:  Follow up in 4 month(s)  Signed, Mertie Moores, MD  05/04/2018 8:58 AM    [facility]

## 2018-05-04 NOTE — Telephone Encounter (Signed)
[place] pharmacy to clarify that pt is taking Metoprolol 100 mg tablet in the morning and Metoprolol 50 mg tablet in the evening. Pharmacist verbalized understanding.

## 2018-05-04 NOTE — Patient Instructions (Signed)
Medication Instructions:  Your physician has recommended you make the following change in your medication:  DECREASE Metoprolol succinate (Toprol XL) to 50 mg in the evening Continue Metoprolol succinate 100 mg in the morning  If you need a refill on your cardiac medications before your next appointment, please call your pharmacy.     Lab work: None Ordered     Testing/Procedures: None Ordered    Follow-Up: Your physician recommends that you return for a follow-up appointment on August 20 at 8:20 am with Dr. Acie Fredrickson

## 2018-05-04 NOTE — Telephone Encounter (Signed)
Follow Up:    David Conner  From [place] called. She said she was calling to get clarification on pt's blood pressure medicine. She did not give the name of medicine when asked.

## 2018-05-05 MED FILL — BRILINTA 90 MG TABLET: 90 | 30 days supply | Qty: 60 | Fill #5

## 2018-05-05 MED FILL — ISOSORBIDE MN ER 60 MG TAB: 60 | 30 days supply | Qty: 30 | Fill #3

## 2018-05-05 MED FILL — METOPROLOL SUCCINATE ER 100: 100 | 30 days supply | Qty: 60 | Fill #5

## 2018-05-07 ENCOUNTER — Encounter (HOSPITAL_COMMUNITY): Payer: 59

## 2018-05-07 MED FILL — TESTOSTERONE 20.25 MG/ACT (: 20.25 MG/AC | 30 days supply | Qty: 75 | Fill #0

## 2018-05-07 MED FILL — REPATHA SURECLICK 140 MG/ML: 140 | 28 days supply | Qty: 2 | Fill #3

## 2018-05-07 MED FILL — ZOLPIDEM TARTRATE 10 MG TAB: 10 | 30 days supply | Qty: 30 | Fill #1

## 2018-05-09 ENCOUNTER — Encounter (HOSPITAL_COMMUNITY): Payer: 59

## 2018-05-11 ENCOUNTER — Encounter (HOSPITAL_COMMUNITY): Payer: 59

## 2018-05-14 ENCOUNTER — Encounter (HOSPITAL_COMMUNITY): Payer: 59

## 2018-05-16 ENCOUNTER — Encounter (HOSPITAL_COMMUNITY): Payer: 59

## 2018-05-18 ENCOUNTER — Encounter (HOSPITAL_COMMUNITY): Payer: 59

## 2018-05-21 MED FILL — ESCITALOPRAM 20 MG TABLET: 20 | 30 days supply | Qty: 30 | Fill #1

## 2018-06-02 MED FILL — ISOSORBIDE MN ER 60 MG TAB: 60 | 30 days supply | Qty: 30 | Fill #4

## 2018-06-04 MED FILL — ZOLPIDEM TARTRATE 10 MG TAB: 10 | 30 days supply | Qty: 30 | Fill #2

## 2018-06-04 MED FILL — REPATHA SURECLICK 140 MG/ML: 140 | 28 days supply | Qty: 2 | Fill #4

## 2018-06-13 ENCOUNTER — Telehealth (HOSPITAL_COMMUNITY): Payer: Self-pay | Admitting: *Deleted

## 2018-06-13 MED FILL — BRILINTA 90 MG TABLET: 90 | 30 days supply | Qty: 60 | Fill #6

## 2018-06-13 MED FILL — ESCITALOPRAM 20 MG TABLET: 20 | 30 days supply | Qty: 30 | Fill #0

## 2018-06-30 MED FILL — ISOSORBIDE MN ER 60 MG TAB: 60 | 30 days supply | Qty: 30 | Fill #5

## 2018-06-30 MED FILL — METOPROLOL SUCCINATE ER 100: 100 | 30 days supply | Qty: 60 | Fill #6

## 2018-07-02 MED FILL — REPATHA SURECLICK 140 MG/ML: 140 | 28 days supply | Qty: 2 | Fill #5

## 2018-07-02 MED FILL — ZOLPIDEM TARTRATE 10 MG TAB: 10 | 30 days supply | Qty: 30 | Fill #0

## 2018-07-09 MED FILL — BRILINTA 90 MG TABLET: 90 | 30 days supply | Qty: 60 | Fill #7

## 2018-07-09 MED FILL — ESCITALOPRAM 20 MG TABLET: 20 | 30 days supply | Qty: 30 | Fill #1

## 2018-07-12 ENCOUNTER — Encounter (HOSPITAL_COMMUNITY): Payer: Self-pay | Admitting: *Deleted

## 2018-07-12 ENCOUNTER — Telehealth (HOSPITAL_COMMUNITY): Payer: Self-pay | Admitting: *Deleted

## 2018-07-12 DIAGNOSIS — Z955 Presence of coronary angioplasty implant and graft: Secondary | ICD-10-CM

## 2018-07-12 DIAGNOSIS — I214 Non-ST elevation (NSTEMI) myocardial infarction: Secondary | ICD-10-CM

## 2018-07-12 NOTE — Progress Notes (Signed)
Discharge Progress Report  Patient Details  Name: COLSON BARCO MRN: [id number] Date of Birth: 09-07-1960 Referring Provider:     [place] from 02/06/2018 in [place]  Referring Provider  Nahser, Wonda Cheng, MD       Number of Visits: 21  Reason for Discharge:  Early Exit:  cardiac rehab is closed due to the [place] 19 Pandemic. Yaman is exercisng on his own  Smoking History:  Social History   Tobacco Use  Smoking Status Never Smoker  Smokeless Tobacco Never Used    Diagnosis:  11/11/2017 NSTEMI (non-ST elevated myocardial infarction) ([place])  12/12/2017 Stented coronary artery S/P DES LAD 11/13/17, S/P DES OM 1 and 2 12/12/17  ADL UCSD:   Initial Exercise Prescription:   Discharge Exercise Prescription (Final Exercise Prescription Changes):   Functional Capacity:   Psychological, QOL, Others - Outcomes: PHQ 2/9: Depression screen PHQ 2/9 02/12/2018  Decreased Interest 2  PHQ - 2 Score 2  Altered sleeping 1  Tired, decreased energy 1  Change in appetite 1  Feeling bad or failure about yourself  1  Trouble concentrating 0  Moving slowly or fidgety/restless 0  Suicidal thoughts 0  PHQ-9 Score 6  Difficult doing work/chores Somewhat difficult    Quality of Life:   Personal Goals: Goals established at orientation with interventions provided to work toward goal.    Personal Goals Discharge:   Exercise Goals and Review:   Exercise Goals Re-Evaluation:   Nutrition & Weight - Outcomes:    Nutrition:   Nutrition Discharge:   Education Questionnaire Score:   Kavaughn attended 21 exercises between 02/06/18-03/30/18. Nieves's attendance was good and Luken did well with exercise until cardiac rehab closed due to the [place] 19 Pandemic. Seaton is now exercising on his own and would like to be discharged from phase 2 cardiac rehab.Barnet Pall, RN,BSN 07/12/2018 3:32 PM

## 2018-07-30 MED FILL — METOPROLOL SUCCINATE ER 100: 100 | 30 days supply | Qty: 60 | Fill #7

## 2018-07-30 MED FILL — ZOLPIDEM TARTRATE 10 MG TAB: 10 | 30 days supply | Qty: 30 | Fill #1

## 2018-07-30 MED FILL — ISOSORBIDE MN ER 60 MG TAB: 60 | 30 days supply | Qty: 30 | Fill #6

## 2018-07-30 MED FILL — REPATHA SURECLICK 140 MG/ML: 140 | 28 days supply | Qty: 2 | Fill #6

## 2018-08-07 ENCOUNTER — Telehealth: Payer: Self-pay

## 2018-08-07 NOTE — Telephone Encounter (Signed)
Called and lmomed the pt regarding needing the new insurance info for a repatha renewal awaiting callback

## 2018-08-07 NOTE — Telephone Encounter (Signed)
-----   Message from Leeroy Bock, [facility] sent at 08/07/2018  8:19 AM EDT ----- Regarding: RE: issues cmm I would call the pt then to see if he changed plans, thanks! ----- Message ----- From: Allean Found, CMA Sent: 08/06/2018   4:39 PM EDT To: Leeroy Bock, RPH Subject: RE: issues cmm                                 I actually called the pharmacy on Saturday and they stated that they had the same insurance on file ----- Message ----- From: Leeroy Bock, [place]: 08/06/2018   7:25 AM EDT To: Allean Found, CMA Subject: RE: issues cmm                                 This one expires on 08/16/18 so it was close to expiring - it still shouldn't give you an error with finding the patient though. Might need to check with him to see if he's changing insurances soon? Otherwise that would be great if you could call the pharmacy! ----- Message ----- From: Allean Found, [place] Sent: 08/04/2018   2:16 PM EDT To: Leeroy Bock, RPH, Ramond Dial, RPH Subject: issues cmm                                      [name] REASON (Key: [id number])   Repatha SureClick 140MG /ML auto-injectors      Form [employer] Electronic Prior Authorization Form (2017 NCPDP)       Created   19 minutes ago    Sent to Plan   10 minutes ago    Plan Response   9 minutes ago    Submit Clinical Questions   less than a minute ago    Determination            Message from [place] find matching patient   I have called the pharmacy and they gave me the exact information that we have on file not sure what is going on. The pharmacy stated that they recently refilled it and it went through perhaps I should call the insurance company on Monday?

## 2018-08-07 NOTE — Telephone Encounter (Signed)
ID [id number] BIN [id number] PCN [id number]

## 2018-08-13 MED FILL — BRILINTA 90 MG TABLET: 90 | 30 days supply | Qty: 60 | Fill #8

## 2018-08-13 MED FILL — TESTOSTERONE 20.25 MG/ACT (: 20.25 MG/AC | 30 days supply | Qty: 75 | Fill #1

## 2018-08-13 MED FILL — NITROGLYCERIN 0.3 MG TAB SL: 0.3 | 10 days supply | Qty: 100 | Fill #1

## 2018-08-14 MED FILL — ESCITALOPRAM 20 MG TABLET: 20 | 30 days supply | Qty: 30 | Fill #0

## 2018-08-17 ENCOUNTER — Telehealth: Payer: Self-pay

## 2018-08-17 NOTE — Telephone Encounter (Signed)
Called and spoke w/pt regarding needing the labs to appeal the denial from cmm for repatha. Pt is compliant and scheduled a lab appt and we will await the results.

## 2018-08-21 ENCOUNTER — Other Ambulatory Visit: Payer: Self-pay

## 2018-08-21 ENCOUNTER — Other Ambulatory Visit: Payer: 59

## 2018-08-21 DIAGNOSIS — E782 Mixed hyperlipidemia: Secondary | ICD-10-CM

## 2018-08-21 LAB — HEPATIC FUNCTION PANEL
ALT: 27 IU/L (ref 0–44)
AST: 25 IU/L (ref 0–40)
Albumin: 4.6 g/dL (ref 3.8–4.9)
Alkaline Phosphatase: 65 IU/L (ref 39–117)
Bilirubin Total: 0.7 mg/dL (ref 0.0–1.2)
Bilirubin, Direct: 0.21 mg/dL (ref 0.00–0.40)
Total Protein: 6.5 g/dL (ref 6.0–8.5)

## 2018-08-21 LAB — LIPID PANEL
Chol/HDL Ratio: 2.1 ratio (ref 0.0–5.0)
Cholesterol, Total: 108 mg/dL (ref 100–199)
HDL: 51 mg/dL (ref >39)
LDL Calculated: 30 mg/dL (ref 0–99)
Triglycerides: 137 mg/dL (ref 0–149)
VLDL Cholesterol Cal: 27 mg/dL (ref 5–40)

## 2018-08-22 ENCOUNTER — Telehealth: Payer: Self-pay | Admitting: Cardiovascular Disease

## 2018-08-22 NOTE — Telephone Encounter (Signed)
Pt will stop by the office tomorrow to pick up Repatha samples, appeals is being submitted.

## 2018-08-22 NOTE — Telephone Encounter (Signed)
  Mr Heisler needs to get his lipid results sent to his insurance company since they denied his [place]. Please send message to his MyChart when its done. He runs out of meds on Saturday

## 2018-08-27 MED FILL — ZOLPIDEM TARTRATE 10 MG TAB: 10 | 30 days supply | Qty: 30 | Fill #2

## 2018-09-03 MED FILL — METOPROLOL SUCCINATE ER 100: 100 | 30 days supply | Qty: 60 | Fill #8

## 2018-09-03 MED FILL — BRILINTA 90 MG TABLET: 90 | 30 days supply | Qty: 60 | Fill #9

## 2018-09-05 NOTE — Progress Notes (Signed)
.    Virtual Visit via Video Note   This visit type was conducted due to national recommendations for restrictions regarding the COVID-19 Pandemic (e.g. social distancing) in an effort to limit this patient's exposure and mitigate transmission in our community.  Due to his co-morbid illnesses, this patient is at least at moderate risk for complications without adequate follow up.  This format is felt to be most appropriate for this patient at this time.  All issues noted in this document were discussed and addressed.  A limited physical exam was performed with this format.  Please refer to the patient's chart for his consent to telehealth for [facility].   Evaluation Performed:  Follow-up visit  Date:  09/06/2018   ID:  David Conner, DOB 09/29/1960, MRN [id number]  Patient Location: Home Provider Location: Home  PCP:  Orpah Melter, MD  Cardiologist:  Mertie Moores, MD  Electrophysiologist:  None   Chief Complaint:  CAD   David Conner is a 58 y.o. male with CAD .  Previous notes:  David Conner is seen back today for follow up visit He is still consumed by his diagnosis.   Seems to have lots of anxiety.  He has had stenting of his mid LAD Oct. 28, 2019.  The terminal LAD remains very small and severely diseased and is not a good target for revascularization Because of his numerous and sometimes conflicting symptoms, we scheduled a GXT He walked for 9 minutes of a Bruce protocol .  Achieved a peak HR of 148 ( 90% predicted max HR ) and had no angina symptoms and no ECG changes.  He stopped due to fagiue and dyspnea.  We enrolled him in cardiac rehab but he has not started yet     He continued to have angina and returned to the cath lab on Nov. 26, 2019 and had stenting of his OM1 and OM2.  Because of his continued symptoms, we started Ranexa which is tolerated very poorly and has stopped.  He is unable to eat and has lost weight. We have held is Rosuvastatin to see if  this helps with the anorexia.    He has been evaluated by Greer Pickerel of [facility] surgery and has a CT angio of the abdomen and pelvis scheduled tomorrow  to look for bowel ischemia .   Has intermittant sharp pain in his chest .   Typically if he does some exercise for a prolonged time - 30 min - 1 hr.  Relieved with Xanax  We tried Ranexa but caused him significant GI pain , nausea  We have tried higher dose Imdur 60 mg - has not tried to walk on his own at this time   Takes Xanax 3 times a day .    Has lost 30 lbs  Has also been given Certriline but he became very resless and so he went back to the xanax .   Has been referred to a psychiatrist   He previously has complained of  lack of appetite and subsequent fatigue  ________________________________________- May 04, 2018   Since I last saw him in Jan. , he has been participating in cardiac rehab and is doing very well. Has completed 6 of his 12 weeks of cardiac rehab. Is exercising at home  Walks at home - 30 minutes a day at 3 miles / hr. Also does physical work No chest pain .  He thinks most of his previous issues were anxiety Is on  Lexapro now instead  Xanax.  Appetite is better, nausea has resolved   Recent labs  Chol = 98 HDL = 50 LDL = 32 Trigs = 83  BP has been generally lower   Is social distancing, Wearing a mask when he goes goes out   The patient does not have symptoms concerning for COVID-19 infection (fever, chills, cough, or new shortness of breath).   Sep 06, 2018  David Conner is seen today for follow up of his CAD . Doing great.   Has been exercising. No significant CP.   Still has some tightness with walking uphill  Exercises and does yard work - not slowed down by angina      Past Medical History:  Diagnosis Date  . Allergy   . Complication of anesthesia    upper gi done, had anxiety with versed  . Coronary artery disease    11/13/17:PCI to mid LAD using Resolute Onyx 3.0 x 18  mm DES  . Depression    slight, on sertraline  . ED (erectile dysfunction)   . GERD (gastroesophageal reflux disease)   . Hyperlipidemia   . Nausea occasional  . Restless legs syndrome    Past Surgical History:  Procedure Laterality Date  . CARDIAC CATHETERIZATION    . CHOLECYSTECTOMY N/A 02/18/2013   Procedure: LAPAROSCOPIC CHOLECYSTECTOMY WITH INTRAOPERATIVE CHOLANGIOGRAM, DIAGNOSTIC LAPAROSCOPY, LYSIS OF ADHESIONS ;  Surgeon: [name], MD;  Location: [facility];  Service: General;  Laterality: N/A;  . colonscopy  2 years ago  . CORONARY STENT INTERVENTION  11/13/2017  . CORONARY STENT INTERVENTION N/A 11/13/2017   Procedure: CORONARY STENT INTERVENTION;  Surgeon: Nelva Bush, MD;  Location: [place] [facility];  Service: Cardiovascular;  Laterality: N/A;  . cortisone injection Left 3 weeks ago   dr thinks is bursitis  . esophagus stretching     7-8 times  . LAPAROSCOPIC APPENDECTOMY N/A 02/18/2013   Procedure: APPENDECTOMY LAPAROSCOPIC;  Surgeon: [name], MD;  Location: [facility];  Service: General;  Laterality: N/A;  DR Redmond Pulling SPOKE WITH PT FAMILY DURING SURGERY ABOUT REMOVING PT APPENDIX DUE TO MALROTATION. FAMILY STATED TO PROCEDE WITH REMOVAL OF APPENDIX.   [name] LEFT HEART CATH AND CORONARY ANGIOGRAPHY N/A 11/13/2017   Procedure: LEFT HEART CATH AND CORONARY ANGIOGRAPHY;  Surgeon: Nelva Bush, MD;  Location: [place] [facility];  Service: Cardiovascular;  Laterality: N/A;  . LEFT HEART CATH AND CORONARY ANGIOGRAPHY N/A 12/12/2017   Procedure: LEFT HEART CATH AND CORONARY ANGIOGRAPHY;  Surgeon: Martinique, Peter M, MD;  Location: [place] [facility];  Service: Cardiovascular;  Laterality: N/A;     Current Meds  Medication Sig  . aspirin EC 81 MG tablet Take 81 mg by mouth daily.   [name] escitalopram (LEXAPRO) 20 MG tablet Take 20 mg by mouth daily.  [name] esomeprazole (NEXIUM) 40 MG capsule Take 40 mg by mouth daily.   . Evolocumab (REPATHA SURECLICK) 330 MG/ML SOAJ Inject 1 pen into  the skin every 14 (fourteen) days.  . isosorbide mononitrate (IMDUR) 60 MG 24 hr tablet Take 60 mg by mouth daily.  . metoprolol succinate (TOPROL-XL) 100 MG 24 hr tablet Take 1 tablet (100 mg total) by mouth 2 (two) times daily.  . nitroGLYCERIN (NITROSTAT) 0.3 MG SL tablet Place 1 tablet (0.3 mg total) under the tongue every 5 (five) minutes as needed for chest pain.  . Testosterone (ANDROGEL PUMP) 20.25 MG/ACT (1.62%) GEL Place 40.5 mg onto the skin daily. 2 pumps  . ticagrelor (  BRILINTA) 90 MG TABS tablet Take 1 tablet (90 mg total) by mouth 2 (two) times daily.  [name] zolpidem (AMBIEN) 10 MG tablet Take 10 mg by mouth at bedtime.  . [DISCONTINUED] metoprolol succinate (TOPROL-XL) 100 MG 24 hr tablet Take in the morning in addition to Metoprolol succinate 50 mg in the evening. Take with or immediately following a meal.  . [DISCONTINUED] metoprolol succinate (TOPROL-XL) 50 MG 24 hr tablet Take in the evening in addition to Metoprolol succinate 100 mg in the morning. Take with or immediately following a meal.     Allergies:   Lansoprazole, Ofloxacin, Penicillin g, and Versed [midazolam]   Social History   Tobacco Use  . Smoking status: Never Smoker  . Smokeless tobacco: Never Used  Substance Use Topics  . Alcohol use: No  . Drug use: No     Family Hx: The patient's family history includes Bladder Cancer in his father; CAD in his maternal grandfather and mother; Diabetes in his father; Healthy in his sister; Hyperlipidemia in his mother; Hypertension in his father and mother.  ROS:   Please see the history of present illness.     All other systems reviewed and are negative.   Prior CV studies:   The following studies were reviewed today:    Labs/Other Tests and Data Reviewed:    EKG:  No ECG reviewed.  Recent Labs: 12/29/2017: BUN 8; Creatinine, Ser 1.08; Hemoglobin 15.7; Platelets 279; Potassium 4.0; Sodium 142 08/21/2018: ALT 27   Recent Lipid Panel Lab Results  Component  Value Date/Time   CHOL 108 08/21/2018 07:54 AM   TRIG 137 08/21/2018 07:54 AM   HDL 51 08/21/2018 07:54 AM   CHOLHDL 2.1 08/21/2018 07:54 AM   CHOLHDL 4.7 11/11/2017 08:43 PM   LDLCALC 30 08/21/2018 07:54 AM    Wt Readings from Last 3 Encounters:  09/06/18 219 lb (99.3 kg)  05/04/18 210 lb 9.6 oz (95.5 kg)  02/06/18 198 lb 13.7 oz (90.2 kg)     Objective:    Vital Signs:  BP 117/85 (BP Location: Left Arm, Patient Position: Standing, Cuff Size: Normal)   Pulse 67   Ht 5' 10.5" (1.791 m)   Wt 219 lb (99.3 kg)   BMI 30.98 kg/m    VITAL SIGNS:  reviewed GEN:  NAD , looks healthy EYES:  sclerae anicteric, EOMI - Extraocular Movements Intact RESPIRATORY: Normal respiratory effort. CARDIOVASCULAR: No peripheral edema. SKIN: No rash, ulcers, skin lesions MUSCULOSKELETAL: No obvious deformities. NEURO: He is alert and oriented x3.  No focal defects. PSYCH: He has a normal affect.  ASSESSMENT & PLAN:    CAD : David Conner is doing very well.  He has only rare episodes of chest tightness but overall his angina is well controlled to continue current medication.  He is on metoprolol 100 mg twice a day which seems to be working well for him.  I will see him again in 6 months for follow-up visit.  We will check labs around that same time.  I will see him again in 6 months for a follow-up office visit.  2.  Hyperlipidemia: He is on Repatha .  Labs look great.  Continue current meds  COVID-19 Education: The signs and symptoms of COVID-19 were discussed with the patient and how to seek care for testing (follow up with PCP or arrange E-visit).  The importance of social distancing was discussed today.  Time:   Today, I have spent 18  minutes with the patient with telehealth  technology discussing the above problems.       Medication Adjustments/Labs and Tests Ordered: Current medicines are reviewed at length with the patient today.  Concerns regarding medicines are outlined above.   Tests  Ordered: Orders Placed This Encounter  Procedures  . Lipid Profile  . Basic Metabolic Panel (BMET)  . Hepatic function panel    Medication Changes: Meds ordered this encounter  Medications  . metoprolol succinate (TOPROL-XL) 100 MG 24 hr tablet    Sig: Take 1 tablet (100 mg total) by mouth 2 (two) times daily.    Dispense:  180 tablet    Refill:  3    Signed, Mertie Moores, MD  09/06/2018 9:58 AM    [facility]

## 2018-09-06 ENCOUNTER — Telehealth (INDEPENDENT_AMBULATORY_CARE_PROVIDER_SITE_OTHER): Payer: 59 | Admitting: Cardiovascular Disease

## 2018-09-06 ENCOUNTER — Other Ambulatory Visit: Payer: Self-pay

## 2018-09-06 VITALS — BP 117/85 | HR 67 | Ht 70.5 in | Wt 219.0 lb

## 2018-09-06 DIAGNOSIS — Z9861 Coronary angioplasty status: Secondary | ICD-10-CM

## 2018-09-06 DIAGNOSIS — Z7189 Other specified counseling: Secondary | ICD-10-CM

## 2018-09-06 DIAGNOSIS — E782 Mixed hyperlipidemia: Secondary | ICD-10-CM | POA: Diagnosis not present

## 2018-09-06 DIAGNOSIS — I2510 Atherosclerotic heart disease of native coronary artery without angina pectoris: Secondary | ICD-10-CM

## 2018-09-06 MED ORDER — METOPROLOL SUCCINATE ER 100 MG PO TB24: 100.0000 mg | ORAL_TABLET | Freq: Two times a day (BID) | ORAL | 3 refills | Status: DC

## 2018-09-06 NOTE — Patient Instructions (Addendum)
Medication Instructions:  Your physician recommends that you continue on your current medications as directed. Please refer to the Current Medication list given to you today. Your Metoprolol Succinate 100 mg prescription has been refilled to provide enough medication to take 1 pill twice daily We have discontinued the Rx for 50 mg tablets TAKE METOPROLOL SUCCINATE (TOPROL XL) 100 MG TWICE DAILY   Lab work: Your physician recommends that you return for lab work in: 6 months on the day of or a few days before your office visit with Dr. Acie Fredrickson.  You will need to FAST for this appointment - nothing to eat or drink after midnight the night before except water.    Testing/Procedures: None Ordered   Follow-Up: At [facility], you and your health needs are our priority.  As part of our continuing mission to provide you with exceptional heart care, we have created designated Provider Care Teams.  These Care Teams include your primary Cardiologist (physician) and Advanced Practice Providers (APPs -  Physician Assistants and Nurse Practitioners) who all work together to provide you with the care you need, when you need it. You will need a follow up appointment in:  6 months.  Please call our office 2 months in advance to schedule this appointment.  You may see Mertie Moores, MD or one of the following Advanced Practice Providers on your designated Care Team: Richardson Dopp, PA-C [place], [place] . Daune Perch, NP

## 2018-09-19 MED FILL — ISOSORBIDE MN ER 60 MG TAB: 60 | 30 days supply | Qty: 30 | Fill #7

## 2018-09-19 MED FILL — ESCITALOPRAM 20 MG TABLET: 20 | 30 days supply | Qty: 30 | Fill #1

## 2018-09-25 MED FILL — ZOLPIDEM TARTRATE 10 MG TAB: 10 | 30 days supply | Qty: 30 | Fill #3

## 2018-09-26 MED FILL — REPATHA SURECLICK 140 MG/ML: 140 | 28 days supply | Qty: 2 | Fill #7

## 2018-10-01 MED FILL — BRILINTA 90 MG TABLET: 90 | 30 days supply | Qty: 60 | Fill #10

## 2018-10-01 MED FILL — TESTOSTERONE 20.25 MG/ACT (: 20.25 MG/AC | 30 days supply | Qty: 75 | Fill #2

## 2018-10-01 MED FILL — METOPROLOL SUCCINATE ER 100: 100 | 30 days supply | Qty: 60 | Fill #9

## 2018-10-22 MED FILL — ESCITALOPRAM 20 MG TABLET: 20 | 30 days supply | Qty: 30 | Fill #2

## 2018-10-22 MED FILL — REPATHA SURECLICK 140 MG/ML: 140 | 28 days supply | Qty: 2 | Fill #8

## 2018-10-22 MED FILL — ISOSORBIDE MN ER 60 MG TAB: 60 | 30 days supply | Qty: 30 | Fill #8

## 2018-10-22 MED FILL — ZOLPIDEM TARTRATE 10 MG TAB: 10 | 30 days supply | Qty: 30 | Fill #0

## 2018-10-30 MED FILL — ESOMEPRAZOLE MAG DR 40 MG C: 40 | 30 days supply | Qty: 30 | Fill #0

## 2018-11-05 MED FILL — METOPROLOL SUCCINATE ER 100: 100 | 30 days supply | Qty: 60 | Fill #10

## 2018-11-05 MED FILL — BRILINTA 90 MG TABLET: 90 | 30 days supply | Qty: 60 | Fill #11

## 2018-11-19 MED FILL — ZOLPIDEM TARTRATE 10 MG TAB: 10 | 30 days supply | Qty: 30 | Fill #1

## 2018-11-19 MED FILL — ISOSORBIDE MN ER 60 MG TAB: 60 | 30 days supply | Qty: 30 | Fill #9

## 2018-11-20 MED FILL — ESCITALOPRAM 20 MG TABLET: 20 | 30 days supply | Qty: 30 | Fill #0

## 2018-11-21 MED FILL — REPATHA SURECLICK 140 MG/ML: 140 | 28 days supply | Qty: 2 | Fill #9

## 2018-12-10 ENCOUNTER — Other Ambulatory Visit: Payer: Self-pay | Admitting: Cardiovascular Disease

## 2018-12-10 ENCOUNTER — Other Ambulatory Visit: Payer: Self-pay | Admitting: Cardiology

## 2018-12-10 MED ORDER — TICAGRELOR 90 MG PO TABS: 90.0000 mg | ORAL_TABLET | Freq: Two times a day (BID) | ORAL | 2 refills | Status: DC

## 2018-12-10 MED ORDER — ISOSORBIDE MONONITRATE ER 60 MG PO TB24: 60.0000 mg | ORAL_TABLET | Freq: Every day | ORAL | 2 refills | Status: DC

## 2018-12-10 MED FILL — TESTOSTERONE 20.25 MG/ACT (: 20.25 MG/AC | 30 days supply | Qty: 75 | Fill #0

## 2018-12-10 MED FILL — BRILINTA 90 MG TABLET: 90 | 30 days supply | Qty: 60 | Fill #0

## 2018-12-10 MED FILL — METOPROLOL SUCCINATE ER 100: 100 | 30 days supply | Qty: 60 | Fill #0

## 2018-12-10 NOTE — Telephone Encounter (Signed)
Pt's medications were sent to pt's pharmacy as requested. Confirmation received.  

## 2018-12-10 NOTE — Telephone Encounter (Signed)
Outpatient Medication Detail   Disp Refills Start End   ticagrelor (BRILINTA) 90 MG TABS tablet 180 tablet 2 12/10/2018    Sig - Route: Take 1 tablet (90 mg total) by mouth 2 (two) times daily. - Oral   Sent to pharmacy as: ticagrelor (BRILINTA) 90 MG Tab tablet   E-Prescribing Status: Receipt confirmed by pharmacy (12/10/2018 9:13 AM EST)   Pharmacy  [place], [place]

## 2018-12-12 MED FILL — ISOSORBIDE MN ER 60 MG TAB: 60 | 30 days supply | Qty: 30 | Fill #0

## 2018-12-25 MED FILL — ZOLPIDEM TARTRATE 10 MG TAB: 10 | 30 days supply | Qty: 30 | Fill #2

## 2018-12-31 MED FILL — REPATHA SURECLICK 140 MG/ML: 140 | 28 days supply | Qty: 2 | Fill #10

## 2018-12-31 MED FILL — ESOMEPRAZOLE MAG DR 40 MG C: 40 | 30 days supply | Qty: 30 | Fill #1

## 2019-01-07 MED FILL — METOPROLOL SUCCINATE ER 100: 100 | 30 days supply | Qty: 60 | Fill #1

## 2019-01-07 MED FILL — BRILINTA 90 MG TABLET: 90 | 30 days supply | Qty: 60 | Fill #1

## 2019-01-07 MED FILL — ESCITALOPRAM 20 MG TABLET: 20 | 30 days supply | Qty: 30 | Fill #1

## 2019-01-07 MED FILL — ISOSORBIDE MN ER 60 MG TAB: 60 | 30 days supply | Qty: 30 | Fill #1

## 2019-01-23 ENCOUNTER — Other Ambulatory Visit: Payer: Self-pay

## 2019-01-23 DIAGNOSIS — E782 Mixed hyperlipidemia: Secondary | ICD-10-CM

## 2019-01-23 MED ORDER — REPATHA SURECLICK 140 MG/ML ~~LOC~~ SOAJ: 1.0000 "pen " | SUBCUTANEOUS | 11 refills | Status: DC

## 2019-01-24 ENCOUNTER — Telehealth: Payer: Self-pay | Admitting: Pharmacist

## 2019-01-24 DIAGNOSIS — E782 Mixed hyperlipidemia: Secondary | ICD-10-CM

## 2019-01-24 MED FILL — TESTOSTERONE 20.25 MG/ACT (: 20.25 MG/AC | 30 days supply | Qty: 75 | Fill #1

## 2019-01-24 MED FILL — ZOLPIDEM TARTRATE 10 MG TAB: 10 | 30 days supply | Qty: 30 | Fill #3

## 2019-01-24 NOTE — Telephone Encounter (Signed)
Called pt for new insurance info this year as Repatha prior authorization renewal was denied due to pt not being found. Pt states he changed to [employer].  ID: [id number] BIN: [id number] PCN: [id number] GRP: [id number]  Will submit new PA. Advised pt I would call him back only if there are any issues with renewal under new insurance. He verbalized understanding.

## 2019-01-28 MED ORDER — REPATHA SURECLICK 140 MG/ML ~~LOC~~ SOAJ: 1.0000 "pen " | SUBCUTANEOUS | 11 refills | Status: DC

## 2019-01-28 MED FILL — REPATHA SURECLICK 140 MG/ML: 140 | 28 days supply | Qty: 2 | Fill #0

## 2019-01-28 NOTE — Telephone Encounter (Signed)
Prior auth approved through 07/26/19, refill sent to pharmacy.

## 2019-02-04 MED FILL — ISOSORBIDE MN ER 60 MG TAB: 60 | 30 days supply | Qty: 30 | Fill #2

## 2019-02-04 MED FILL — BRILINTA 90 MG TABLET: 90 | 30 days supply | Qty: 60 | Fill #2

## 2019-02-04 MED FILL — METOPROLOL SUCCINATE ER 100: 100 | 30 days supply | Qty: 60 | Fill #2

## 2019-02-18 MED FILL — REPATHA SURECLICK 140 MG/ML: 140 | 28 days supply | Qty: 2 | Fill #1

## 2019-02-18 MED FILL — ESCITALOPRAM 20 MG TABLET: 20 | 30 days supply | Qty: 30 | Fill #2

## 2019-02-19 MED FILL — ZOLPIDEM TARTRATE 10 MG TAB: 10 | 30 days supply | Qty: 30 | Fill #4

## 2019-03-07 MED FILL — METOPROLOL SUCCINATE ER 100: 100 | 30 days supply | Qty: 60 | Fill #3

## 2019-03-07 MED FILL — ISOSORBIDE MN ER 60 MG TAB: 60 | 30 days supply | Qty: 30 | Fill #3

## 2019-03-07 MED FILL — BRILINTA 90 MG TABLET: 90 | 30 days supply | Qty: 60 | Fill #3

## 2019-03-08 ENCOUNTER — Other Ambulatory Visit: Payer: 59

## 2019-03-12 ENCOUNTER — Other Ambulatory Visit: Payer: 59

## 2019-03-12 ENCOUNTER — Ambulatory Visit: Payer: 59 | Admitting: Cardiovascular Disease

## 2019-03-22 ENCOUNTER — Other Ambulatory Visit: Payer: Managed Care, Other (non HMO) | Admitting: *Deleted

## 2019-03-22 ENCOUNTER — Other Ambulatory Visit: Payer: Self-pay

## 2019-03-22 DIAGNOSIS — I2510 Atherosclerotic heart disease of native coronary artery without angina pectoris: Secondary | ICD-10-CM

## 2019-03-22 DIAGNOSIS — E782 Mixed hyperlipidemia: Secondary | ICD-10-CM

## 2019-03-22 LAB — HEPATIC FUNCTION PANEL
ALT: 24 IU/L (ref 0–44)
AST: 24 IU/L (ref 0–40)
Albumin: 4.7 g/dL (ref 3.8–4.9)
Alkaline Phosphatase: 66 IU/L (ref 39–117)
Bilirubin Total: 0.3 mg/dL (ref 0.0–1.2)
Bilirubin, Direct: 0.12 mg/dL (ref 0.00–0.40)
Total Protein: 6.6 g/dL (ref 6.0–8.5)

## 2019-03-22 LAB — BASIC METABOLIC PANEL
BUN/Creatinine Ratio: 15 (ref 9–20)
BUN: 13 mg/dL (ref 6–24)
CO2: 23 mmol/L (ref 20–29)
Calcium: 9.4 mg/dL (ref 8.7–10.2)
Chloride: 104 mmol/L (ref 96–106)
Creatinine, Ser: 0.89 mg/dL (ref 0.76–1.27)
GFR calc Af Amer: 109 mL/min/{1.73_m2} (ref >59)
GFR calc non Af Amer: 94 mL/min/{1.73_m2} (ref >59)
Glucose: 96 mg/dL (ref 65–99)
Potassium: 4.4 mmol/L (ref 3.5–5.2)
Sodium: 142 mmol/L (ref 134–144)

## 2019-03-22 LAB — LIPID PANEL
Chol/HDL Ratio: 2 ratio (ref 0.0–5.0)
Cholesterol, Total: 85 mg/dL — ABNORMAL LOW (ref 100–199)
HDL: 43 mg/dL (ref >39)
LDL Chol Calc (NIH): 25 mg/dL (ref 0–99)
Triglycerides: 86 mg/dL (ref 0–149)
VLDL Cholesterol Cal: 17 mg/dL (ref 5–40)

## 2019-03-25 MED FILL — REPATHA SURECLICK 140 MG/ML: 140 | 28 days supply | Qty: 2 | Fill #2

## 2019-03-25 MED FILL — ZOLPIDEM TARTRATE 10 MG TAB: 10 | 30 days supply | Qty: 30 | Fill #5

## 2019-03-25 MED FILL — ESCITALOPRAM 20 MG TABLET: 20 | 30 days supply | Qty: 30 | Fill #3

## 2019-03-27 ENCOUNTER — Other Ambulatory Visit: Payer: Self-pay

## 2019-03-27 ENCOUNTER — Encounter: Payer: Self-pay | Admitting: Cardiovascular Disease

## 2019-03-27 ENCOUNTER — Ambulatory Visit: Payer: Managed Care, Other (non HMO) | Admitting: Cardiovascular Disease

## 2019-03-27 VITALS — BP 124/82 | HR 59 | Ht 70.5 in | Wt 238.5 lb

## 2019-03-27 DIAGNOSIS — I2510 Atherosclerotic heart disease of native coronary artery without angina pectoris: Secondary | ICD-10-CM | POA: Diagnosis not present

## 2019-03-27 DIAGNOSIS — E782 Mixed hyperlipidemia: Secondary | ICD-10-CM | POA: Diagnosis not present

## 2019-03-27 DIAGNOSIS — Z9861 Coronary angioplasty status: Secondary | ICD-10-CM | POA: Diagnosis not present

## 2019-03-27 DIAGNOSIS — I214 Non-ST elevation (NSTEMI) myocardial infarction: Secondary | ICD-10-CM | POA: Diagnosis not present

## 2019-03-27 NOTE — Progress Notes (Signed)
Cardiology Office Note:    Date:  03/27/2019   ID:  David Conner, DOB [birth year], MRN [id number]  PCP:  Orpah Melter, MD  Cardiologist:  Mertie Moores, MD  Electrophysiologist:  None   Referring MD: Orpah Melter, MD   Chief Complaint  Patient presents with  . Coronary Artery Disease    Nov. 15, 2019   David Conner is a 59 y.o. male with a recent diagnosis of coronary artery disease.  He was admitted to the hospital in October.  He was found to have a tight LAD stenosis which was stented.  He has a chronic total occlusion of a moderate-sized diagonal vessel.  He also has moderate to severe disease in his distal right coronary artery and obtuse marginal artery.  [name] to have episodes of chest discomfort.  He saw [name], NP week and his  Toprol-XL was  doubled and Imdur 30  was added . Also added Xanax   Has not had any syncope or presyncope  He seems to have fewer chest pains since the increase in meds  Also admits that he has not done as much   Dec. 9, 2019:  David Conner seen today for follow-up visit.  Wt = 207 lbs.  He was admitted to the hospital with symptoms of unstable angina.  He had PCI of his obtuse marginal arteries.  He still has severe diffuse disease involving his distal  left anterior descending artery. His D1 is 100%  occluded.   His angina is clearly better.   Has been taking it easy  Still has some chest pressure when he over did it last week  Is walking on the treadmill -  No angina when walking on the treadmill  Jan. 16, 2020  David Conner is seen back today for follow up visit He is still consumed by his diagnosis.   Seems to have lots of anxiety.  He has had stenting of his mid LAD Oct. 28, 2019.  The terminal LAD remains very small and severely diseased and is not a good target for revascularization Because of his numerous and sometimes conflicting symptoms, we scheduled a GXT He walked for 9 minutes of a Bruce protocol .  Achieved a peak  HR of 148 ( 90% predicted max HR ) and had no angina symptoms and no ECG changes.  He stopped due to fagiue and dyspnea.  We enrolled him in cardiac rehab but he has not started yet     He continued to have angina and returned to the cath lab on Nov. 26, 2019 and had stenting of his OM1 and OM2.  Because of his continued symptoms, we started Ranexa which is tolerated very poorly and has stopped   He is unable to eat and has lost weight. We have held is Rosuvastatin to see if this helps with the anorexia.    He has been evaluated by Greer Pickerel of [facility] and has a CT angio of the abdomen and pelvis scheduled tomorrow  to look for bowel ischemia .   Has intermittant sharp pain in his chest .   Typically if he does some exercise for a prolonged time - 30 min - 1 hr.  Relieved with Xanax  We tried Ranexa but caused him significant GI pain , nausea  We have tried higher dose Imdur 60 mg - has not tried to walk on his own at this time   Takes Xanax 3 times a day .  Has lost 30 lbs  Has also been given Certriline but he became very resless and so he went back to the xanax .   Has been referred to a psychiatrist   His main complaint today is lack of appetite and subsequent fatigue   March 27, 2019:  David Conner is seen today for follow-up of his coronary artery disease.  He is feeling much better than when I saw him last year.  Is not getting much exercise.  Is some mild tightness in his chest with walking .  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  Lipid levels are reviewed - levels are great   Past Medical History:  Diagnosis Date  . Allergy   . Complication of anesthesia    upper gi done, had anxiety with versed  . Coronary artery disease    11/13/17:PCI to mid LAD using Resolute Onyx 3.0 x 18 mm DES  . Depression    slight, on sertraline  . ED (erectile  dysfunction)   . GERD (gastroesophageal reflux disease)   . Hyperlipidemia   . Nausea occasional  . Restless legs syndrome     Past Surgical History:  Procedure Laterality Date  . CARDIAC CATHETERIZATION    . CHOLECYSTECTOMY N/A 02/18/2013   Procedure: LAPAROSCOPIC CHOLECYSTECTOMY WITH INTRAOPERATIVE CHOLANGIOGRAM, DIAGNOSTIC LAPAROSCOPY, LYSIS OF ADHESIONS ;  Surgeon: [name], MD;  Location: [facility];  Service: General;  Laterality: N/A;  . colonscopy  2 years ago  . CORONARY STENT INTERVENTION  11/13/2017  . CORONARY STENT INTERVENTION N/A 11/13/2017   Procedure: CORONARY STENT INTERVENTION;  Surgeon: Nelva Bush, MD;  Location: [place] [facility];  Service: Cardiovascular;  Laterality: N/A;  . cortisone injection Left 3 weeks ago   dr thinks is bursitis  . esophagus stretching     7-8 times  . LAPAROSCOPIC APPENDECTOMY N/A 02/18/2013   Procedure: APPENDECTOMY LAPAROSCOPIC;  Surgeon: [name], MD;  Location: [facility];  Service: General;  Laterality: N/A;  DR Redmond Pulling SPOKE WITH PT FAMILY DURING SURGERY ABOUT REMOVING PT APPENDIX DUE TO MALROTATION. FAMILY STATED TO PROCEDE WITH REMOVAL OF APPENDIX.   [name] LEFT HEART CATH AND CORONARY ANGIOGRAPHY N/A 11/13/2017   Procedure: LEFT HEART CATH AND CORONARY ANGIOGRAPHY;  Surgeon: Nelva Bush, MD;  Location: [place] [facility];  Service: Cardiovascular;  Laterality: N/A;  . LEFT HEART CATH AND CORONARY ANGIOGRAPHY N/A 12/12/2017   Procedure: LEFT HEART CATH AND CORONARY ANGIOGRAPHY;  Surgeon: Martinique, Peter M, MD;  Location: [place] [facility];  Service: Cardiovascular;  Laterality: N/A;    Current Medications: Current Meds  Medication Sig  . aspirin EC 81 MG tablet Take 81 mg by mouth daily.   [name] escitalopram (LEXAPRO) 20 MG tablet Take 20 mg by mouth daily.  [name] esomeprazole (NEXIUM) 40 MG capsule Take 40 mg by mouth daily.   . Evolocumab (REPATHA SURECLICK) XX123456 MG/ML SOAJ Inject 1 pen into the skin every 14 (fourteen) days.  .  isosorbide mononitrate (IMDUR) 60 MG 24 hr tablet Take 1 tablet (60 mg total) by mouth daily.  . metoprolol succinate (TOPROL-XL) 100 MG 24 hr tablet TAKE 1 TABLET BY MOUTH 2 TIMES DAILY. TAKE WITH OR IMMEDIATELY FOLLOWING A MEAL.  . nitroGLYCERIN (NITROSTAT) 0.3 MG SL tablet Place 1 tablet (0.3 mg total) under the tongue every 5 (five) minutes as needed for chest pain.  [name]  Testosterone (ANDROGEL PUMP) 20.25 MG/ACT (1.62%) GEL Place 40.5 mg onto the skin daily. 2 pumps  . ticagrelor (BRILINTA) 90 MG TABS tablet Take 1 tablet (90 mg total) by mouth 2 (two) times daily.  [name] zolpidem (AMBIEN) 10 MG tablet Take 10 mg by mouth at bedtime.     Allergies:   Lansoprazole, Ofloxacin, Penicillin g, and Versed [midazolam]   Social History   Socioeconomic History  . Marital status: Married    Spouse name: Not on file  . Number of children: Not on file  . Years of education: Not on file  . Highest education level: Master's degree (e.g., MA, MS, MEng, MEd, MSW, MBA)  Occupational History  . Not on file  Tobacco Use  . Smoking status: Never Smoker  . Smokeless tobacco: Never Used  Substance and Sexual Activity  . Alcohol use: No  . Drug use: No  . Sexual activity: Yes    Birth control/protection: Sponge  Other Topics Concern  . Not on file  Social History Narrative  . Not on file   Social Determinants of Health   Financial Resource Strain:   . Difficulty of Paying Living Expenses: Not on file  Food Insecurity:   . Worried About David fundraiser in the Last Year: Not on file  . Ran Out of Food in the Last Year: Not on file  Transportation Needs:   . Lack of Transportation (Medical): Not on file  . Lack of Transportation (Non-Medical): Not on file  Physical Activity:   . Days of Exercise per Week: Not on file  . Minutes of Exercise per Session: Not on file  Stress:   . Feeling of Stress : Not on file  Social Connections:   . Frequency of Communication with Friends and Family: Not on  file  . Frequency of Social Gatherings with Friends and Family: Not on file  . Attends Religious Services: Not on file  . Active Member of Clubs or Organizations: Not on file  . Attends Archivist Meetings: Not on file  . Marital Status: Not on file     Family History: The patient's family history includes Bladder Cancer in his father; CAD in his maternal grandfather and mother; Diabetes in his father; Healthy in his sister; Hyperlipidemia in his mother; Hypertension in his father and mother.  ROS:   Listed in HPI, otherwise he is doing well ROS is negative     EKGs/Labs/Other Studies Reviewed:       Recent Labs: 03/22/2019: ALT 24; BUN 13; Creatinine, Ser 0.89; Potassium 4.4; Sodium 142  Recent Lipid Panel    Component Value Date/Time   CHOL 85 (L) 03/22/2019 0814   TRIG 86 03/22/2019 0814   HDL 43 03/22/2019 0814   CHOLHDL 2.0 03/22/2019 0814   CHOLHDL 4.7 11/11/2017 2043   VLDL 8 11/11/2017 2043   LDLCALC 25 03/22/2019 0814    Physical Exam:    Physical Exam: Blood pressure 124/82, pulse (!) 59, height 5' 10.5" (1.791 m), weight 238 lb 8 oz (108.2 kg), SpO2 97 %.  GEN:  Well nourished, well developed in no acute distress HEENT: Normal NECK: No JVD; No carotid bruits LYMPHATICS: No lymphadenopathy CARDIAC: RRR , no murmurs, rubs, gallops RESPIRATORY:  Clear to auscultation without rales, wheezing or rhonchi  ABDOMEN: Soft, non-tender, non-distended MUSCULOSKELETAL:  No edema; No deformity  SKIN: Warm and dry NEUROLOGIC:  Alert and oriented x 3    EKG:     March 27, 2019:  Sinus bradycardia 59 beats minute.  No ST or T wave changes.   ASSESSMENT:    1. Coronary artery disease involving native coronary artery of native heart without angina pectoris    PLAN:       1.  Coronary artery disease:  David Conner is doing very well.  He is stopped exercising and as a result has gotten a little bit out of shape compared to where he was last year.  He has some  shortness of breath with walking.  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  I encouraged him to get back into his exercise program.  He has known disease of the small vessels.  There is no indication that he has worsening major coronary artery problems.  His labs look great.  Cholesterol levels are very low.  Continue current medications.  . 2.  Weight gain: Encouraged him to continue working on a diet, exercise, weight loss program.  Had a lot of lost a lot of weight last year but has regained and then some.    2.  Hyperlipidemia: Labs look great on Repatha.     Medication Adjustments/Labs and Tests Ordered: Current medicines are reviewed at length with the patient today.  Concerns regarding medicines are outlined above.  Orders Placed This Encounter  Procedures  . Basic metabolic panel  . Lipid panel  . Hepatic function panel  . EKG 12-Lead   No orders of the defined types were placed in this encounter.   Patient Instructions  Medication Instructions:  Your provider recommends that you continue on your current medications as directed. Please refer to the Current Medication list given to you today.   *If you need a refill on your cardiac medications before your next appointment, please call your pharmacy*  Lab Work: Your provider recommends that you return for FASTING lab work a few days prior to your visit next year.  If you have labs (blood work) drawn today and your tests are completely normal, you will receive your results only by: [name] MyChart Message (if you have MyChart) OR . A paper copy in the mail If you have any lab test that is abnormal or we need to change your treatment, we will call you to review the results.  Follow-Up: At [facility], you and your health needs are our priority.  As part of our continuing mission to provide you with exceptional heart care, we have created designated Provider Care Teams.  These  Care Teams include your primary Cardiologist (physician) and Advanced Practice Providers (APPs -  Physician Assistants and Nurse Practitioners) who all work together to provide you with the care you need, when you need it. Your next appointment:   12 month(s) The format for your next appointment:   In Person Provider:   You may see Mertie Moores, MD or one of the following Advanced Practice Providers on your designated Care Team:    Richardson Dopp, PA-C  [place], [place]  Daune Perch, [place]

## 2019-03-27 NOTE — Patient Instructions (Signed)
Medication Instructions:  Your provider recommends that you continue on your current medications as directed. Please refer to the Current Medication list given to you today.   *If you need a refill on your cardiac medications before your next appointment, please call your pharmacy*  Lab Work: Your provider recommends that you return for FASTING lab work a few days prior to your visit next year.  If you have labs (blood work) drawn today and your tests are completely normal, you will receive your results only by: [name] MyChart Message (if you have MyChart) OR . A paper copy in the mail If you have any lab test that is abnormal or we need to change your treatment, we will call you to review the results.  Follow-Up: At [facility], you and your health needs are our priority.  As part of our continuing mission to provide you with exceptional heart care, we have created designated Provider Care Teams.  These Care Teams include your primary Cardiologist (physician) and Advanced Practice Providers (APPs -  Physician Assistants and Nurse Practitioners) who all work together to provide you with the care you need, when you need it. Your next appointment:   12 month(s) The format for your next appointment:   In Person Provider:   You may see Mertie Moores, MD or one of the following Advanced Practice Providers on your designated Care Team:    Richardson Dopp, PA-C  [place], [place]  Daune Perch, [place]

## 2019-04-02 ENCOUNTER — Other Ambulatory Visit: Payer: Self-pay | Admitting: Physician Assistant

## 2019-04-02 ENCOUNTER — Ambulatory Visit: Payer: Managed Care, Other (non HMO)

## 2019-04-02 ENCOUNTER — Other Ambulatory Visit: Payer: Self-pay

## 2019-04-02 DIAGNOSIS — M7989 Other specified soft tissue disorders: Secondary | ICD-10-CM

## 2019-04-11 MED FILL — BRILINTA 90 MG TABLET: 90 | 30 days supply | Qty: 60 | Fill #4

## 2019-04-11 MED FILL — ISOSORBIDE MN ER 60 MG TAB: 60 | 30 days supply | Qty: 30 | Fill #4

## 2019-04-11 MED FILL — METOPROLOL SUCCINATE ER 100: 100 | 30 days supply | Qty: 60 | Fill #4

## 2019-04-17 MED FILL — REPATHA SURECLICK 140 MG/ML: 140 | 28 days supply | Qty: 2 | Fill #3

## 2019-04-17 MED FILL — ESCITALOPRAM 20 MG TABLET: 20 | 30 days supply | Qty: 30 | Fill #4

## 2019-04-23 MED FILL — TESTOSTERONE 20.25 MG/ACT (: 20.25 MG/AC | 30 days supply | Qty: 75 | Fill #2

## 2019-04-26 MED FILL — ZOLPIDEM TARTRATE 10 MG TAB: 10 | 90 days supply | Qty: 90 | Fill #0

## 2019-05-07 MED FILL — ISOSORBIDE MN ER 60 MG TAB: 60 | 30 days supply | Qty: 30 | Fill #5

## 2019-05-07 MED FILL — BRILINTA 90 MG TABLET: 90 | 30 days supply | Qty: 60 | Fill #5

## 2019-05-07 MED FILL — METOPROLOL SUCCINATE ER 100: 100 | 30 days supply | Qty: 60 | Fill #5

## 2019-05-08 MED FILL — REPATHA SURECLICK 140 MG/ML: 140 | 28 days supply | Qty: 2 | Fill #4

## 2019-05-20 MED FILL — ESCITALOPRAM 20 MG TABLET: 20 | 30 days supply | Qty: 30 | Fill #5

## 2019-06-10 MED FILL — METOPROLOL SUCCINATE ER 100: 100 | 30 days supply | Qty: 60 | Fill #6

## 2019-06-10 MED FILL — BRILINTA 90 MG TABLET: 90 | 30 days supply | Qty: 60 | Fill #6

## 2019-06-10 MED FILL — ISOSORBIDE MN ER 60 MG TAB: 60 | 30 days supply | Qty: 30 | Fill #6

## 2019-06-13 MED FILL — REPATHA SURECLICK 140 MG/ML: 140 | 28 days supply | Qty: 2 | Fill #5

## 2019-06-14 MED FILL — ESCITALOPRAM 20 MG TABLET: 20 | 30 days supply | Qty: 30 | Fill #0

## 2019-07-07 MED FILL — ISOSORBIDE MN ER 60 MG TAB: 60 | 30 days supply | Qty: 30 | Fill #7

## 2019-07-07 MED FILL — METOPROLOL SUCCINATE ER 100: 100 | 30 days supply | Qty: 60 | Fill #7

## 2019-07-07 MED FILL — BRILINTA 90 MG TABLET: 90 | 30 days supply | Qty: 60 | Fill #7

## 2019-07-08 MED FILL — REPATHA SURECLICK 140 MG/ML: 140 | 28 days supply | Qty: 2 | Fill #6

## 2019-07-22 MED FILL — ESCITALOPRAM 20 MG TABLET: 20 | 30 days supply | Qty: 30 | Fill #1

## 2019-07-25 ENCOUNTER — Telehealth: Payer: Self-pay

## 2019-07-25 NOTE — Telephone Encounter (Signed)
Called and advised pt to please email me the new insurance card so that I may proceed with a new pa. Pt stated that they will proceed with sending me an email asap

## 2019-07-30 MED FILL — ZOLPIDEM TARTRATE 10 MG TAB: 10 | 90 days supply | Qty: 90 | Fill #0

## 2019-08-02 MED FILL — DOXYCYCLINE HYCLATE 100 MG: 100 | 10 days supply | Qty: 20 | Fill #0

## 2019-08-02 MED FILL — REPATHA SURECLICK 140 MG/ML: 140 | 28 days supply | Qty: 2 | Fill #7

## 2019-08-07 MED FILL — BRILINTA 90 MG TABLET: 90 | 30 days supply | Qty: 60 | Fill #8

## 2019-08-07 MED FILL — METOPROLOL SUCCINATE ER 100: 100 | 30 days supply | Qty: 60 | Fill #8

## 2019-08-14 MED FILL — ISOSORBIDE MN ER 60 MG TAB: 60 | 30 days supply | Qty: 30 | Fill #8

## 2019-08-20 MED FILL — ESCITALOPRAM 20 MG TABLET: 20 | 30 days supply | Qty: 30 | Fill #0

## 2019-08-28 ENCOUNTER — Other Ambulatory Visit (HOSPITAL_COMMUNITY): Payer: Self-pay | Admitting: Family Medicine

## 2019-08-28 MED FILL — REPATHA SURECLICK 140 MG/ML: 140 | 28 days supply | Qty: 2 | Fill #8

## 2019-08-28 MED FILL — ESOMEPRAZOLE MAG DR 40 MG C: 40 | 90 days supply | Qty: 90 | Fill #0

## 2019-09-03 MED FILL — CIPROFLOXACIN HCL 500 MG TA: 500 | 10 days supply | Qty: 20 | Fill #0

## 2019-09-09 ENCOUNTER — Other Ambulatory Visit (HOSPITAL_COMMUNITY): Payer: Self-pay | Admitting: Psychology

## 2019-09-09 ENCOUNTER — Other Ambulatory Visit: Payer: Self-pay | Admitting: Cardiovascular Disease

## 2019-09-10 ENCOUNTER — Other Ambulatory Visit: Payer: Self-pay | Admitting: Cardiovascular Disease

## 2019-09-10 MED FILL — METOPROLOL SUCCINATE ER 100: 100 | 30 days supply | Qty: 60 | Fill #0

## 2019-09-10 MED FILL — BRILINTA 90 MG TABLET: 90 | 30 days supply | Qty: 60 | Fill #0

## 2019-09-11 MED FILL — ESCITALOPRAM 20 MG TABLET: 20 | 30 days supply | Qty: 30 | Fill #0

## 2019-09-12 MED FILL — levoFLOXacin 500 MG TABS: 500 | 10 days supply | Qty: 10 | Fill #0

## 2019-09-16 ENCOUNTER — Other Ambulatory Visit: Payer: Self-pay | Admitting: Cardiovascular Disease

## 2019-09-17 ENCOUNTER — Other Ambulatory Visit: Payer: Self-pay | Admitting: Cardiovascular Disease

## 2019-09-17 MED FILL — ISOSORBIDE MN ER 60 MG TAB: 60 | 30 days supply | Qty: 30 | Fill #0

## 2019-10-07 MED FILL — BRILINTA 90 MG TABLET: 90 | 30 days supply | Qty: 60 | Fill #1

## 2019-10-07 MED FILL — REPATHA SURECLICK 140 MG/ML: 140 | 28 days supply | Qty: 2 | Fill #9

## 2019-10-07 MED FILL — METOPROLOL SUCCINATE ER 100: 100 | 30 days supply | Qty: 60 | Fill #1

## 2019-10-16 MED FILL — ESCITALOPRAM 20 MG TABLET: 20 | 30 days supply | Qty: 30 | Fill #1

## 2019-10-16 MED FILL — ISOSORBIDE MN ER 60 MG TAB: 60 | 30 days supply | Qty: 30 | Fill #1

## 2019-10-28 ENCOUNTER — Emergency Department (HOSPITAL_COMMUNITY)
Admission: EM | Admit: 2019-10-28 | Discharge: 2019-10-28 | Disposition: A | Discharge status: Home / Self Care | Payer: Managed Care, Other (non HMO) | Attending: Emergency Medicine | Admitting: Emergency Medicine

## 2019-10-28 ENCOUNTER — Encounter (HOSPITAL_COMMUNITY): Payer: Self-pay | Admitting: Emergency Medicine

## 2019-10-28 DIAGNOSIS — Z955 Presence of coronary angioplasty implant and graft: Secondary | ICD-10-CM | POA: Diagnosis not present

## 2019-10-28 DIAGNOSIS — R110 Nausea: Secondary | ICD-10-CM | POA: Diagnosis not present

## 2019-10-28 DIAGNOSIS — Z7982 Long term (current) use of aspirin: Secondary | ICD-10-CM | POA: Diagnosis not present

## 2019-10-28 DIAGNOSIS — Z79899 Other long term (current) drug therapy: Secondary | ICD-10-CM | POA: Diagnosis not present

## 2019-10-28 DIAGNOSIS — I10 Essential (primary) hypertension: Secondary | ICD-10-CM | POA: Diagnosis not present

## 2019-10-28 DIAGNOSIS — I2510 Atherosclerotic heart disease of native coronary artery without angina pectoris: Secondary | ICD-10-CM | POA: Insufficient documentation

## 2019-10-28 DIAGNOSIS — R197 Diarrhea, unspecified: Secondary | ICD-10-CM | POA: Diagnosis not present

## 2019-10-28 DIAGNOSIS — R55 Syncope and collapse: Secondary | ICD-10-CM | POA: Insufficient documentation

## 2019-10-28 LAB — TROPONIN I (HIGH SENSITIVITY): Troponin I (High Sensitivity): 2 ng/L (ref <18)

## 2019-10-28 LAB — CBC
HCT: 44.8 % (ref 39.0–52.0)
Hemoglobin: 14.8 g/dL (ref 13.0–17.0)
MCH: 31.3 pg (ref 26.0–34.0)
MCHC: 33 g/dL (ref 30.0–36.0)
MCV: 94.7 fL (ref 80.0–100.0)
Platelets: 272 10*3/uL (ref 150–400)
RBC: 4.73 MIL/uL (ref 4.22–5.81)
RDW: 12.1 % (ref 11.5–15.5)
WBC: 7 10*3/uL (ref 4.0–10.5)
nRBC: 0 % (ref 0.0–0.2)

## 2019-10-28 LAB — URINALYSIS, ROUTINE W REFLEX MICROSCOPIC
Bilirubin Urine: NEGATIVE
Glucose, UA: NEGATIVE mg/dL
Hgb urine dipstick: NEGATIVE
Ketones, ur: 20 mg/dL — AB
Leukocytes,Ua: NEGATIVE
Nitrite: NEGATIVE
Protein, ur: NEGATIVE mg/dL
Specific Gravity, Urine: 1.027 (ref 1.005–1.030)
pH: 6 (ref 5.0–8.0)

## 2019-10-28 LAB — BASIC METABOLIC PANEL
Anion gap: 10 (ref 5–15)
BUN: 12 mg/dL (ref 6–20)
CO2: 25 mmol/L (ref 22–32)
Calcium: 8.9 mg/dL (ref 8.9–10.3)
Chloride: 104 mmol/L (ref 98–111)
Creatinine, Ser: 1.05 mg/dL (ref 0.61–1.24)
GFR, Estimated: >60 mL/min (ref >60)
Glucose, Bld: 110 mg/dL — ABNORMAL HIGH (ref 70–99)
Potassium: 3.8 mmol/L (ref 3.5–5.1)
Sodium: 139 mmol/L (ref 135–145)

## 2019-10-28 MED FILL — REPATHA SURECLICK 140 MG/ML: 140 | 28 days supply | Qty: 2 | Fill #10

## 2019-10-28 NOTE — ED Notes (Signed)
CBG 107 

## 2019-10-28 NOTE — ED Triage Notes (Signed)
Pt states he woke up this morning feeling pretty well, after he ate breakfast drank 2 cups of coffee and then drank a coke he suddenly became dizzy and felt cool and clammy as well as nausea. Due to this feeling he took his BP which was  173/110. Pt had no CP or SOB but has hx of MI so was concerned.

## 2019-10-28 NOTE — Discharge Instructions (Addendum)
Continue your current medications.  Follow-up with your doctor to be rechecked.  Return as needed for worsening symptoms °

## 2019-10-28 NOTE — ED Notes (Signed)
Patient verbalizes understanding of discharge instructions. Opportunity for questioning and answers were provided. Armband removed by staff, pt discharged from ED ambulatory by self\  

## 2019-10-28 NOTE — ED Provider Notes (Signed)
[facility] EMERGENCY DEPARTMENT Provider Note   CSN: [id number] Arrival date & time: 10/28/19  1121     History Chief Complaint  Patient presents with  . Near Syncope    David Conner is a 59 y.o. male.  HPI   Patient presented to the ED for evaluation of hypertension and feeling nauseated.  Patient states he initially woke up this morning and felt fine.  He had a couple cups of coffee and then a Coke.  Suddenly started to feel nauseated, cool and clammy.  He felt a burning discomfort throughout his entire body.  Patient checked his blood pressure and it was elevated at 173/110.  Patient did not have any trouble with any chest pain or shortness of breath.  Patient also had some loose stools diarrhea today.  No abdominal pain.  No fevers.  Patient does have a history of heart disease and was worried if this could be related to his heart.  Past Medical History:  Diagnosis Date  . Allergy   . Complication of anesthesia    upper gi done, had anxiety with versed  . Coronary artery disease    11/13/17:PCI to mid LAD using Resolute Onyx 3.0 x 18 mm DES  . Depression    slight, on sertraline  . ED (erectile dysfunction)   . GERD (gastroesophageal reflux disease)   . Hyperlipidemia   . Nausea occasional  . Restless legs syndrome     Patient Active Problem List   Diagnosis Date Noted  . Angina pectoris ([place]) 12/12/2017  . Essential (primary) hypertension 11/23/2017  . Hyperlipidemia 11/14/2017  . CAD S/P percutaneous coronary angioplasty 11/14/2017  . NSTEMI (non-ST elevated myocardial infarction) ([place]) 11/11/2017  . S/P laparoscopic cholecystectomy 02/19/2013  . 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 CARDIAC CATHETERIZATION    . CHOLECYSTECTOMY N/A 02/18/2013   Procedure: LAPAROSCOPIC CHOLECYSTECTOMY WITH INTRAOPERATIVE CHOLANGIOGRAM, DIAGNOSTIC LAPAROSCOPY, LYSIS OF ADHESIONS ;  Surgeon: [name], MD;  Location: [facility];  Service: General;  Laterality: N/A;  . colonscopy  2 years ago  . CORONARY STENT INTERVENTION  11/13/2017  . CORONARY STENT INTERVENTION N/A 11/13/2017   Procedure: CORONARY STENT INTERVENTION;  Surgeon: Nelva Bush, MD;  Location: [place] [facility];  Service: Cardiovascular;  Laterality: N/A;  . cortisone injection Left 3 weeks ago   dr thinks is bursitis  . esophagus stretching     7-8 times  . LAPAROSCOPIC APPENDECTOMY N/A 02/18/2013   Procedure: APPENDECTOMY LAPAROSCOPIC;  Surgeon: [name], MD;  Location: [facility];  Service: General;  Laterality: N/A;  DR Redmond Pulling SPOKE WITH PT FAMILY DURING SURGERY ABOUT REMOVING PT APPENDIX DUE TO MALROTATION. FAMILY STATED TO PROCEDE WITH REMOVAL OF APPENDIX.   [name] LEFT HEART CATH AND CORONARY ANGIOGRAPHY N/A 11/13/2017   Procedure: LEFT HEART CATH AND CORONARY ANGIOGRAPHY;  Surgeon: Nelva Bush, MD;  Location: [place] [facility] [facility];  Service: Cardiovascular;  Laterality: N/A;  . LEFT HEART CATH AND CORONARY ANGIOGRAPHY N/A 12/12/2017   Procedure: LEFT HEART CATH AND CORONARY ANGIOGRAPHY;  Surgeon: Martinique, Peter M, MD;  Location: [facility];  Service: Cardiovascular;  Laterality: N/A;       Family History  Problem Relation Age of Onset  . CAD Mother        CABG  . Hypertension Mother   . Hyperlipidemia Mother   . Hypertension Father   . Bladder Cancer Father   . Diabetes  Father   . Healthy Sister   . CAD Maternal Grandfather        Died of MI at 24    Social History   Tobacco Use  . Smoking status: Never Smoker  . Smokeless tobacco: Never Used  Vaping Use  . Vaping Use: Never used  Substance Use Topics  . Alcohol use: No  . Drug use: No    Home Medications Prior to Admission medications   Medication Sig Start Date End Date Taking? Authorizing Provider  aspirin EC 81 MG tablet Take 81 mg by mouth daily.     [provider]  BRILINTA 90 MG TABS tablet TAKE 1 TABLET (90 MG  TOTAL) BY MOUTH 2 (TWO) TIMES DAILY. 09/10/19   Nahser, Wonda Cheng, MD  escitalopram (LEXAPRO) 20 MG tablet Take 20 mg by mouth daily.    [provider]  esomeprazole (NEXIUM) 40 MG capsule Take 40 mg by mouth daily.     [provider]  Evolocumab (REPATHA SURECLICK) 614 MG/ML SOAJ Inject 1 pen into the skin every 14 (fourteen) days. 01/28/19   Nahser, Wonda Cheng, MD  isosorbide mononitrate (IMDUR) 60 MG 24 hr tablet TAKE 1 TABLET (60 MG TOTAL) BY MOUTH DAILY. 09/17/19   Nahser, Wonda Cheng, MD  metoprolol succinate (TOPROL-XL) 100 MG 24 hr tablet TAKE 1 TABLET BY MOUTH 2 TIMES DAILY. TAKE WITH OR IMMEDIATELY FOLLOWING A MEAL. 12/10/18   Nahser, Wonda Cheng, MD  nitroGLYCERIN (NITROSTAT) 0.3 MG SL tablet Place 1 tablet (0.3 mg total) under the tongue every 5 (five) minutes as needed for chest pain. 02/22/18   Nahser, Wonda Cheng, MD  Testosterone (ANDROGEL PUMP) 20.25 MG/ACT (1.62%) GEL Place 40.5 mg onto the skin daily. 2 pumps    [provider]  zolpidem (AMBIEN) 10 MG tablet Take 10 mg by mouth at bedtime. 11/08/17   [provider]    Allergies    Lansoprazole, Ofloxacin, Penicillin g, and Versed [midazolam]  Review of Systems   Review of Systems  HENT:       Pt noticed bruising around his eye the other day, does not recall any injury  All other systems reviewed and are negative.   Physical Exam Updated Vital Signs BP 130/85   Pulse 67   Temp 99.1 F (37.3 C) (Oral)   Resp 11   Ht 1.702 m (5\' 7" )   Wt 106.6 kg   SpO2 96%   BMI 36.81 kg/m   Physical Exam Vitals and nursing note reviewed.  Constitutional:      General: He is not in acute distress.    Appearance: He is well-developed.  HENT:     Head: Normocephalic and atraumatic.     Right Ear: External ear normal.     Left Ear: External ear normal.  Eyes:     General: No scleral icterus.       Right eye: No discharge.        Left eye: No discharge.     Conjunctiva/sclera: Conjunctivae normal.      Comments: Periorbital ecchymoses around right eye  Neck:     Trachea: No tracheal deviation.  Cardiovascular:     Rate and Rhythm: Normal rate and regular rhythm.  Pulmonary:     Effort: Pulmonary effort is normal. No respiratory distress.     Breath sounds: Normal breath sounds. No stridor. No wheezing or rales.  Abdominal:     General: Bowel sounds are normal. There is no distension.  Palpations: Abdomen is soft.     Tenderness: There is no abdominal tenderness. There is no guarding or rebound.  Musculoskeletal:        General: No tenderness.     Cervical back: Neck supple.  Skin:    General: Skin is warm and dry.     Findings: No rash.  Neurological:     Mental Status: He is alert.     Cranial Nerves: No cranial nerve deficit (no facial droop, extraocular movements intact, no slurred speech).     Sensory: No sensory deficit.     Motor: No abnormal muscle tone or seizure activity.     Coordination: Coordination normal.     ED Results / Procedures / Treatments   Labs (all labs ordered are listed, but only abnormal results are displayed) Labs Reviewed  BASIC METABOLIC PANEL - Abnormal; Notable for the following components:      Result Value   Glucose, Bld 110 (*)    All other components within normal limits  URINALYSIS, ROUTINE W REFLEX MICROSCOPIC - Abnormal; Notable for the following components:   Ketones, ur 20 (*)    All other components within normal limits  CBC  CBG MONITORING, ED  TROPONIN I (HIGH SENSITIVITY)  TROPONIN I (HIGH SENSITIVITY)    EKG EKG Interpretation  Date/Time:  Monday October 28 2019 11:51:51 EDT Ventricular Rate:  66 PR Interval:  168 QRS Duration: 98 QT Interval:  422 QTC Calculation: 442 R Axis:   -14 Text Interpretation: Normal sinus rhythm Incomplete right bundle branch block Borderline ECG t wave changes on prior ECG less pronounced otherwise no signficiant changes Confirmed by Dorie Rank [phone]) on 10/28/2019 3:07:09  PM   Radiology No results found.  Procedures Procedures (including critical care time)  Medications Ordered in ED Medications - No data to display  ED Course  I have reviewed the triage vital signs and the nursing notes.  Pertinent labs & imaging results that were available during my care of the patient were reviewed by me and considered in my medical decision making (see chart for details).    MDM Rules/Calculators/A&P                          Patient presented to the ED for evaluation of a near syncopal episode.  Patient's work-up in the ED is reassuring.  He has remained normotensive with no dysrhythmia.  EKG is unremarkable.  Labs are reassuring including normal cardiac markers CBC and electrolyte panel.  Doubt ACS, acute cardiac dysrhythmia.  No signs of anemia or acute infection.  No severe dehydration.  Symptoms may have been vasovagal episode.  Patient appears stable for discharge. Final Clinical Impression(s) / ED Diagnoses Final diagnoses:  Near syncope    Rx / DC Orders ED Discharge Orders    None       Dorie Rank, MD 10/28/19 1930

## 2019-10-29 LAB — CBG MONITORING, ED: Glucose-Capillary: 107 mg/dL — ABNORMAL HIGH (ref 70–99)

## 2019-11-05 MED FILL — BRILINTA 90 MG TABLET: 90 | 30 days supply | Qty: 60 | Fill #2

## 2019-11-05 MED FILL — METOPROLOL SUCCINATE ER 100: 100 | 30 days supply | Qty: 60 | Fill #2

## 2019-11-11 MED FILL — ESCITALOPRAM 20 MG TABLET: 20 | 30 days supply | Qty: 30 | Fill #2

## 2019-11-11 MED FILL — ISOSORBIDE MN ER 60 MG TAB: 60 | 30 days supply | Qty: 30 | Fill #2

## 2019-11-14 ENCOUNTER — Telehealth: Payer: Self-pay | Admitting: Cardiovascular Disease

## 2019-11-14 ENCOUNTER — Other Ambulatory Visit: Payer: Self-pay | Admitting: Cardiovascular Disease

## 2019-11-14 MED ORDER — NITROGLYCERIN 0.3 MG SL SUBL: 0.3000 mg | SUBLINGUAL_TABLET | SUBLINGUAL | 0 refills | Status: DC | PRN

## 2019-11-14 MED FILL — ZOLPIDEM TARTRATE 10 MG TAB: 10 | 90 days supply | Qty: 90 | Fill #1

## 2019-11-14 MED FILL — NITROGLYCERIN 0.3 MG TAB SL: 0.3 | 33 days supply | Qty: 100 | Fill #0

## 2019-11-14 NOTE — Telephone Encounter (Signed)
     Pt c/o medication issue:  1. Name of Medication: nitroGLYCERIN (NITROSTAT) 0.3 MG SL tablet  2. How are you currently taking this medication (dosage and times per day)? 0.3 mg as needed  3. Are you having a reaction (difficulty breathing--STAT)? no  4. What is your medication issue? Pharmacy had question about the instructions about dispensing the medication. The pharmacist said the rx was written for 0.3 mg /75 pills. The 0.3 mg dosage only comes in 100 ct bottles and can not be split up.The 0.4 mg tablet can come in 25 ct bottles.  The pharmacy will need a new rx to adjust correct dispensing. Pt uses [place] [place]

## 2019-11-14 NOTE — Telephone Encounter (Signed)
Pt's medication was resent to pt's pharmacy as requested. Confirmation received.  °

## 2019-11-25 MED FILL — REPATHA SURECLICK 140 MG/ML: 140 | 28 days supply | Qty: 2 | Fill #11

## 2019-12-09 ENCOUNTER — Other Ambulatory Visit: Payer: Self-pay | Admitting: Cardiovascular Disease

## 2019-12-09 MED FILL — ESCITALOPRAM 20 MG TABLET: 20 | 30 days supply | Qty: 30 | Fill #3

## 2019-12-09 MED FILL — METOPROLOL SUCCINATE ER 100: 100 | 30 days supply | Qty: 60 | Fill #0

## 2019-12-09 MED FILL — ISOSORBIDE MN ER 60 MG TAB: 60 | 30 days supply | Qty: 30 | Fill #3

## 2019-12-09 MED FILL — BRILINTA 90 MG TABLET: 90 | 30 days supply | Qty: 60 | Fill #3

## 2019-12-09 NOTE — Telephone Encounter (Signed)
rx refill

## 2020-01-03 ENCOUNTER — Other Ambulatory Visit (HOSPITAL_COMMUNITY): Payer: Self-pay | Admitting: Endodontics

## 2020-01-03 MED FILL — BRILINTA 90 MG TABLET: 90 | 30 days supply | Qty: 60 | Fill #4

## 2020-01-03 MED FILL — predniSONE 5 MG (21) TBPK: 5 | 6 days supply | Qty: 21 | Fill #0

## 2020-01-03 MED FILL — REPATHA SURECLICK 140 MG/ML: 140 | 28 days supply | Qty: 2 | Fill #0

## 2020-01-03 MED FILL — ISOSORBIDE MN ER 60 MG TAB: 60 | 30 days supply | Qty: 30 | Fill #4

## 2020-01-03 MED FILL — ESCITALOPRAM 20 MG TABLET: 20 | 30 days supply | Qty: 30 | Fill #4

## 2020-01-03 MED FILL — METOPROLOL SUCCINATE ER 100: 100 | 30 days supply | Qty: 60 | Fill #1

## 2020-01-27 ENCOUNTER — Other Ambulatory Visit: Payer: Self-pay | Admitting: Cardiovascular Disease

## 2020-01-27 DIAGNOSIS — E782 Mixed hyperlipidemia: Secondary | ICD-10-CM

## 2020-01-27 MED FILL — REPATHA SURECLICK 140 MG/ML: 140 | 28 days supply | Qty: 2 | Fill #0

## 2020-02-05 MED FILL — ISOSORBIDE MN ER 60 MG TAB: 60 | 30 days supply | Qty: 30 | Fill #5

## 2020-02-05 MED FILL — METOPROLOL SUCCINATE ER 100: 100 | 30 days supply | Qty: 60 | Fill #2

## 2020-02-05 MED FILL — ESCITALOPRAM 20 MG TABLET: 20 | 30 days supply | Qty: 30 | Fill #5

## 2020-02-10 MED FILL — BRILINTA 90 MG TABLET: 90 | 30 days supply | Qty: 60 | Fill #5

## 2020-02-12 MED FILL — ZOLPIDEM TARTRATE 10 MG TAB: 10 | 90 days supply | Qty: 90 | Fill #0

## 2020-02-18 MED FILL — REPATHA SURECLICK 140 MG/ML: 140 | 28 days supply | Qty: 2 | Fill #1

## 2020-02-21 ENCOUNTER — Other Ambulatory Visit (HOSPITAL_COMMUNITY): Payer: Self-pay | Admitting: Urology

## 2020-02-21 MED FILL — TAMSULOSIN HCL 0.4 MG CAP: 0.4 | 30 days supply | Qty: 30 | Fill #0

## 2020-03-06 ENCOUNTER — Other Ambulatory Visit: Payer: Self-pay | Admitting: Cardiovascular Disease

## 2020-03-06 MED FILL — ESCITALOPRAM 20 MG TABLET: 20 | 30 days supply | Qty: 30 | Fill #6

## 2020-03-06 MED FILL — METOPROLOL SUCCINATE ER 100: 100 | 30 days supply | Qty: 60 | Fill #0

## 2020-03-06 MED FILL — ISOSORBIDE MN ER 60 MG TAB: 60 | 30 days supply | Qty: 30 | Fill #6

## 2020-03-12 MED FILL — REPATHA SURECLICK 140 MG/ML: 140 | 28 days supply | Qty: 2 | Fill #2

## 2020-03-13 MED FILL — BRILINTA 90 MG TABLET: 90 | 30 days supply | Qty: 60 | Fill #6

## 2020-03-20 ENCOUNTER — Other Ambulatory Visit: Payer: Self-pay

## 2020-03-20 ENCOUNTER — Other Ambulatory Visit: Payer: Managed Care, Other (non HMO) | Admitting: *Deleted

## 2020-03-20 DIAGNOSIS — I2510 Atherosclerotic heart disease of native coronary artery without angina pectoris: Secondary | ICD-10-CM

## 2020-03-20 LAB — HEPATIC FUNCTION PANEL
ALT: 28 IU/L (ref 0–44)
AST: 25 IU/L (ref 0–40)
Albumin: 4.9 g/dL (ref 3.8–4.9)
Alkaline Phosphatase: 77 IU/L (ref 44–121)
Bilirubin Total: 0.5 mg/dL (ref 0.0–1.2)
Bilirubin, Direct: 0.18 mg/dL (ref 0.00–0.40)
Total Protein: 7.2 g/dL (ref 6.0–8.5)

## 2020-03-20 LAB — BASIC METABOLIC PANEL
BUN/Creatinine Ratio: 14 (ref 9–20)
BUN: 14 mg/dL (ref 6–24)
CO2: 22 mmol/L (ref 20–29)
Calcium: 10 mg/dL (ref 8.7–10.2)
Chloride: 100 mmol/L (ref 96–106)
Creatinine, Ser: 1.01 mg/dL (ref 0.76–1.27)
Glucose: 104 mg/dL — ABNORMAL HIGH (ref 65–99)
Potassium: 4.5 mmol/L (ref 3.5–5.2)
Sodium: 139 mmol/L (ref 134–144)
eGFR: 86 mL/min/{1.73_m2} (ref >59)

## 2020-03-20 LAB — LIPID PANEL
Chol/HDL Ratio: 2.3 ratio (ref 0.0–5.0)
Cholesterol, Total: 119 mg/dL (ref 100–199)
HDL: 51 mg/dL (ref >39)
LDL Chol Calc (NIH): 42 mg/dL (ref 0–99)
Triglycerides: 157 mg/dL — ABNORMAL HIGH (ref 0–149)
VLDL Cholesterol Cal: 26 mg/dL (ref 5–40)

## 2020-03-26 ENCOUNTER — Encounter: Payer: Self-pay | Admitting: Cardiovascular Disease

## 2020-03-26 NOTE — Progress Notes (Signed)
Cardiology Office Note:    Date:  03/27/2020   ID:  David Conner, DOB [birth year], MRN [id number]  PCP:  Orpah Melter, MD  Cardiologist:  Mertie Moores, MD  Electrophysiologist:  None   Referring MD: Orpah Melter, MD   Chief Complaint  Patient presents with  . Coronary Artery Disease    Nov. 15, 2019   David Conner is a 60 y.o. male with a recent diagnosis of coronary artery disease.  He was admitted to the hospital in October.  He was found to have a tight LAD stenosis which was stented.  He has a chronic total occlusion of a moderate-sized diagonal vessel.  He also has moderate to severe disease in his distal right coronary artery and obtuse marginal artery.  [name] to have episodes of chest discomfort.  He saw [name], NP week and his  Toprol-XL was  doubled and Imdur 30  was added . Also added Xanax   Has not had any syncope or presyncope  He seems to have fewer chest pains since the increase in meds  Also admits that he has not done as much   Dec. 9, 2019:  David Conner seen today for follow-up visit.  Wt = 207 lbs.  He was admitted to the hospital with symptoms of unstable angina.  He had PCI of his obtuse marginal arteries.  He still has severe diffuse disease involving his distal  left anterior descending artery. His D1 is 100%  occluded.   His angina is clearly better.   Has been taking it easy  Still has some chest pressure when he over did it last week  Is walking on the treadmill -  No angina when walking on the treadmill  Jan. 16, 2020  David Conner is seen back today for follow up visit He is still consumed by his diagnosis.   Seems to have lots of anxiety.  He has had stenting of his mid LAD Oct. 28, 2019.  The terminal LAD remains very small and severely diseased and is not a good target for revascularization Because of his numerous and sometimes conflicting symptoms, we scheduled a GXT He walked for 9 minutes of a Bruce protocol .  Achieved a peak  HR of 148 ( 90% predicted max HR ) and had no angina symptoms and no ECG changes.  He stopped due to fagiue and dyspnea.  We enrolled him in cardiac rehab but he has not started yet     He continued to have angina and returned to the cath lab on Nov. 26, 2019 and had stenting of his OM1 and OM2.  Because of his continued symptoms, we started Ranexa which is tolerated very poorly and has stopped   He is unable to eat and has lost weight. We have held is Rosuvastatin to see if this helps with the anorexia.    He has been evaluated by Greer Pickerel of [facility] surgery and has a CT angio of the abdomen and pelvis scheduled tomorrow  to look for bowel ischemia .   Has intermittant sharp pain in his chest .   Typically if he does some exercise for a prolonged time - 30 min - 1 hr.  Relieved with Xanax  We tried Ranexa but caused him significant GI pain , nausea  We have tried higher dose Imdur 60 mg - has not tried to walk on his own at this time   Takes Xanax 3 times a day .  Has lost 30 lbs  Has also been given Certriline but he became very resless and so he went back to the xanax .   Has been referred to a psychiatrist   His main complaint today is lack of appetite and subsequent fatigue   March 27, 2019:  David Conner is seen today for follow-up of his coronary artery disease.  He is feeling much better than when I saw him last year.  Is not getting much exercise.  Is some mild tightness in his chest with walking .  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  Lipid levels are reviewed - levels are great   March 27, 2020: David Conner is seen today for follow up of his CAD Feeling fine,  No Cp or dyspena  Not exercising as much  Work stress  Wt is 239 lbs.  Admits that he is eating more carbs than he should  We reviewed recent labs .  LDL looks great.  Trigs are mildly elevate.    Past Medical History:  Diagnosis Date  . Allergy   . Complication of anesthesia    upper gi done, had anxiety with versed  . Coronary artery disease    11/13/17:PCI to mid LAD using Resolute Onyx 3.0 x 18 mm DES  . Depression    slight, on sertraline  . ED (erectile dysfunction)   . GERD (gastroesophageal reflux disease)   . Hyperlipidemia   . Nausea occasional  . Restless legs syndrome     Past Surgical History:  Procedure Laterality Date  . CARDIAC CATHETERIZATION    . CHOLECYSTECTOMY N/A 02/18/2013   Procedure: LAPAROSCOPIC CHOLECYSTECTOMY WITH INTRAOPERATIVE CHOLANGIOGRAM, DIAGNOSTIC LAPAROSCOPY, LYSIS OF ADHESIONS ;  Surgeon: [name], MD;  Location: [facility];  Service: General;  Laterality: N/A;  . colonscopy  2 years ago  . CORONARY STENT INTERVENTION  11/13/2017  . CORONARY STENT INTERVENTION N/A 11/13/2017   Procedure: CORONARY STENT INTERVENTION;  Surgeon: Nelva Bush, MD;  Location: [place] [facility];  Service: Cardiovascular;  Laterality: N/A;  . cortisone injection Left 3 weeks ago   dr thinks is bursitis  . esophagus stretching     7-8 times  . LAPAROSCOPIC APPENDECTOMY N/A 02/18/2013   Procedure: APPENDECTOMY LAPAROSCOPIC;  Surgeon: [name], MD;  Location: [facility];  Service: General;  Laterality: N/A;  DR Redmond Pulling SPOKE WITH PT FAMILY DURING SURGERY ABOUT REMOVING PT APPENDIX DUE TO MALROTATION. FAMILY STATED TO PROCEDE WITH REMOVAL OF APPENDIX.   [name] LEFT HEART CATH AND CORONARY ANGIOGRAPHY N/A 11/13/2017   Procedure: LEFT HEART CATH AND CORONARY ANGIOGRAPHY;  Surgeon: Nelva Bush, MD;  Location: [place] [facility];  Service: Cardiovascular;  Laterality: N/A;  . LEFT HEART CATH AND CORONARY ANGIOGRAPHY N/A 12/12/2017   Procedure: LEFT HEART CATH AND CORONARY ANGIOGRAPHY;  Surgeon: Martinique, Peter M, MD;  Location: [place] [facility];  Service: Cardiovascular;  Laterality: N/A;    Current Medications: Current Meds  Medication Sig  . aspirin EC 81 MG  tablet Take 81 mg by mouth daily.   [name] BRILINTA 90 MG TABS tablet TAKE 1 TABLET (90 MG TOTAL) BY MOUTH 2 (TWO) TIMES DAILY.  [name] escitalopram (LEXAPRO) 20 MG tablet Take 20 mg by mouth daily.  [name] esomeprazole (NEXIUM) 40 MG capsule Take 40 mg by mouth daily.   . isosorbide mononitrate (IMDUR) 60 MG 24 hr tablet  TAKE 1 TABLET (60 MG TOTAL) BY MOUTH DAILY.  . metoprolol succinate (TOPROL-XL) 100 MG 24 hr tablet TAKE 1 TABLET BY MOUTH TWO TIMES DAILY WITH OR IMMEDIATELY FOLLOWING A MEAL  . nitroGLYCERIN (NITROSTAT) 0.3 MG SL tablet Place 1 tablet (0.3 mg total) under the tongue every 5 (five) minutes as needed for chest pain.  [name] REPATHA SURECLICK 867 MG/ML SOAJ INJECT 1 PEN INTO THE SKIN EVERY 14 DAYS  . zolpidem (AMBIEN) 10 MG tablet Take 10 mg by mouth at bedtime.     Allergies:   Lansoprazole, Ofloxacin, Penicillin g, and Versed [midazolam]   Social History   Socioeconomic History  . Marital status: Married    Spouse name: Not on file  . Number of children: Not on file  . Years of education: Not on file  . Highest education level: Master's degree (e.g., MA, MS, MEng, MEd, MSW, MBA)  Occupational History  . Not on file  Tobacco Use  . Smoking status: Never Smoker  . Smokeless tobacco: Never Used  Vaping Use  . Vaping Use: Never used  Substance and Sexual Activity  . Alcohol use: No  . Drug use: No  . Sexual activity: Yes    Birth control/protection: Sponge  Other Topics Concern  . Not on file  Social History Narrative  . Not on file   Social Determinants of Health   Financial Resource Strain: Not on file  Food Insecurity: Not on file  Transportation Needs: Not on file  Physical Activity: Not on file  Stress: Not on file  Social Connections: Not on file     Family History: The patient's family history includes Bladder Cancer in his father; CAD in his maternal grandfather and mother; Diabetes in his father; Healthy in his sister; Hyperlipidemia in his mother; Hypertension in  his father and mother.  ROS:   Listed in HPI, otherwise he is doing well ROS is negative     EKGs/Labs/Other Studies Reviewed:       Recent Labs: 10/28/2019: Hemoglobin 14.8; Platelets 272 03/20/2020: ALT 28; BUN 14; Creatinine, Ser 1.01; Potassium 4.5; Sodium 139  Recent Lipid Panel    Component Value Date/Time   CHOL 119 03/20/2020 0823   TRIG 157 (H) 03/20/2020 0823   HDL 51 03/20/2020 0823   CHOLHDL 2.3 03/20/2020 0823   CHOLHDL 4.7 11/11/2017 2043   VLDL 8 11/11/2017 2043   LDLCALC 42 03/20/2020 0823    Physical Exam:     Physical Exam: Blood pressure 104/76, pulse 61, height 5\' 10"  (1.778 m), weight 239 lb (108.4 kg), SpO2 95 %.  GEN:   Middle age, mildly obese man.   NAD  HEENT: Normal NECK: No JVD; No carotid bruits LYMPHATICS: No lymphadenopathy CARDIAC: RRR , no murmurs, rubs, gallops RESPIRATORY:  Clear to auscultation without rales, wheezing or rhonchi  ABDOMEN: Soft, non-tender, non-distended MUSCULOSKELETAL:  No edema; No deformity  SKIN: Warm and dry NEUROLOGIC:  Alert and oriented x 3    EKG:     March 27, 2020: Normal sinus rhythm at 61.  No ST or T wave changes.  ASSESSMENT:    1. Coronary artery disease involving native coronary artery of native heart without angina pectoris    PLAN:       1.  Coronary artery disease:   He seems to be doing well.  Is not having any episodes of angina.  Continue current medications.  . 2.  Weight gain: He is gained little bit of weight.  He  admits to eating too much carbohydrates.  We have asked him to reduce his carbohydrate intake.  He needs to avoid foods that are white, weak, sweet.   2.  Hyperlipidemia: chol  look great on Repatha.  Trigs are still elevated.      Medication Adjustments/Labs and Tests Ordered: Current medicines are reviewed at length with the patient today.  Concerns regarding medicines are outlined above.  Orders Placed This Encounter  Procedures  . EKG 12-Lead   No orders  of the defined types were placed in this encounter.   Patient Instructions  Medication Instructions:  Your physician recommends that you continue on your current medications as directed. Please refer to the Current Medication list given to you today.  *If you need a refill on your cardiac medications before your next appointment, please call your pharmacy*   Lab Work: Your physician recommends that you return for lab work in:1 year on the day of or a few days before your office visit with Dr. Acie Fredrickson.  You will need to FAST for this appointment - nothing to eat or drink after midnight the night before except water.    Testing/Procedures: none   Follow-Up: At [facility], you and your health needs are our priority.  As part of our continuing mission to provide you with exceptional heart care, we have created designated Provider Care Teams.  These Care Teams include your primary Cardiologist (physician) and Advanced Practice Providers (APPs -  Physician Assistants and Nurse Practitioners) who all work together to provide you with the care you need, when you need it.   Your next appointment:   1 year(s)  The format for your next appointment:   In Person  Provider:   You may see Mertie Moores, MD or one of the following Advanced Practice Providers on your designated Care Team:    Richardson Dopp, PA-C  [place], [place]

## 2020-03-27 ENCOUNTER — Ambulatory Visit: Payer: Managed Care, Other (non HMO) | Admitting: Cardiovascular Disease

## 2020-03-27 ENCOUNTER — Other Ambulatory Visit: Payer: Self-pay

## 2020-03-27 ENCOUNTER — Encounter: Payer: Self-pay | Admitting: Cardiovascular Disease

## 2020-03-27 VITALS — BP 104/76 | HR 61 | Ht 70.0 in | Wt 239.0 lb

## 2020-03-27 DIAGNOSIS — I2510 Atherosclerotic heart disease of native coronary artery without angina pectoris: Secondary | ICD-10-CM

## 2020-03-27 DIAGNOSIS — Z9861 Coronary angioplasty status: Secondary | ICD-10-CM | POA: Diagnosis not present

## 2020-03-27 DIAGNOSIS — E782 Mixed hyperlipidemia: Secondary | ICD-10-CM | POA: Diagnosis not present

## 2020-03-27 NOTE — Patient Instructions (Signed)
Medication Instructions:  Your physician recommends that you continue on your current medications as directed. Please refer to the Current Medication list given to you today.  *If you need a refill on your cardiac medications before your next appointment, please call your pharmacy*   Lab Work: Your physician recommends that you return for lab work in:1 year on the day of or a few days before your office visit with Dr. Acie Fredrickson.  You will need to FAST for this appointment - nothing to eat or drink after midnight the night before except water.    Testing/Procedures: none   Follow-Up: At [facility], you and your health needs are our priority.  As part of our continuing mission to provide you with exceptional heart care, we have created designated Provider Care Teams.  These Care Teams include your primary Cardiologist (physician) and Advanced Practice Providers (APPs -  Physician Assistants and Nurse Practitioners) who all work together to provide you with the care you need, when you need it.   Your next appointment:   1 year(s)  The format for your next appointment:   In Person  Provider:   You may see Mertie Moores, MD or one of the following Advanced Practice Providers on your designated Care Team:    Richardson Dopp, PA-C  [place], [place]

## 2020-04-10 ENCOUNTER — Other Ambulatory Visit: Payer: Self-pay | Admitting: Cardiovascular Disease

## 2020-04-10 MED FILL — METOPROLOL SUCCINATE ER 100: 100 | 90 days supply | Qty: 180 | Fill #0

## 2020-04-10 MED FILL — ESCITALOPRAM 20 MG TABLET: 20 | 30 days supply | Qty: 30 | Fill #7

## 2020-04-10 MED FILL — ISOSORBIDE MN ER 60 MG TAB: 60 | 90 days supply | Qty: 90 | Fill #0

## 2020-04-10 MED FILL — BRILINTA 90 MG TABLET: 90 | 30 days supply | Qty: 60 | Fill #7

## 2020-04-13 MED FILL — REPATHA SURECLICK 140 MG/ML: 140 | 28 days supply | Qty: 2 | Fill #3

## 2020-05-06 ENCOUNTER — Other Ambulatory Visit (HOSPITAL_COMMUNITY): Payer: Self-pay

## 2020-05-06 MED ORDER — ZOLPIDEM TARTRATE 10 MG PO TABS
10.0000 mg | ORAL_TABLET | Freq: Every evening | ORAL | 1 refills | Status: DC | PRN
Start: 2020-05-06 — End: 2021-03-05
  Filled 2020-05-08: qty 90, 90d supply, fill #0
  Filled 2020-08-04: qty 90, 90d supply, fill #1

## 2020-05-06 MED FILL — Ticagrelor Tab 90 MG: ORAL | 30 days supply | Qty: 60 | Fill #0 | Status: AC

## 2020-05-06 MED FILL — Escitalopram Oxalate Tab 20 MG (Base Equiv): ORAL | 30 days supply | Qty: 30 | Fill #0 | Status: AC

## 2020-05-08 ENCOUNTER — Other Ambulatory Visit (HOSPITAL_COMMUNITY): Payer: Self-pay

## 2020-05-11 ENCOUNTER — Other Ambulatory Visit (HOSPITAL_COMMUNITY): Payer: Self-pay

## 2020-05-11 MED FILL — Evolocumab Subcutaneous Soln Auto-Injector 140 MG/ML: SUBCUTANEOUS | 30 days supply | Qty: 2 | Fill #0 | Status: AC

## 2020-05-12 ENCOUNTER — Other Ambulatory Visit (HOSPITAL_COMMUNITY): Payer: Self-pay

## 2020-06-08 ENCOUNTER — Other Ambulatory Visit (HOSPITAL_COMMUNITY): Payer: Self-pay

## 2020-06-08 MED FILL — Evolocumab Subcutaneous Soln Auto-Injector 140 MG/ML: SUBCUTANEOUS | 30 days supply | Qty: 2 | Fill #1 | Status: AC

## 2020-06-08 MED FILL — Isosorbide Mononitrate Tab ER 24HR 60 MG: ORAL | 90 days supply | Qty: 90 | Fill #0 | Status: AC

## 2020-06-12 ENCOUNTER — Other Ambulatory Visit (HOSPITAL_COMMUNITY): Payer: Self-pay

## 2020-06-12 MED FILL — Escitalopram Oxalate Tab 20 MG (Base Equiv): ORAL | 30 days supply | Qty: 30 | Fill #1 | Status: AC

## 2020-06-12 MED FILL — Ticagrelor Tab 90 MG: ORAL | 30 days supply | Qty: 60 | Fill #1 | Status: AC

## 2020-06-23 ENCOUNTER — Other Ambulatory Visit (HOSPITAL_COMMUNITY): Payer: Self-pay

## 2020-07-06 ENCOUNTER — Other Ambulatory Visit (HOSPITAL_COMMUNITY): Payer: Self-pay

## 2020-07-06 ENCOUNTER — Other Ambulatory Visit: Payer: Self-pay | Admitting: Cardiovascular Disease

## 2020-07-06 MED ORDER — ESCITALOPRAM OXALATE 10 MG PO TABS
10.0000 mg | ORAL_TABLET | Freq: Every morning | ORAL | 1 refills | Status: DC
Start: 2020-07-06 — End: 2021-03-05
  Filled 2020-07-06: qty 30, 30d supply, fill #0
  Filled 2020-08-04: qty 30, 30d supply, fill #1

## 2020-07-06 MED FILL — Metoprolol Succinate Tab ER 24HR 100 MG (Tartrate Equiv): ORAL | 90 days supply | Qty: 180 | Fill #0 | Status: AC

## 2020-07-06 MED FILL — Evolocumab Subcutaneous Soln Auto-Injector 140 MG/ML: SUBCUTANEOUS | 30 days supply | Qty: 2 | Fill #2 | Status: AC

## 2020-07-07 ENCOUNTER — Other Ambulatory Visit (HOSPITAL_COMMUNITY): Payer: Self-pay

## 2020-07-07 MED ORDER — TICAGRELOR 90 MG PO TABS
ORAL_TABLET | Freq: Two times a day (BID) | ORAL | 10 refills | Status: DC
  Filled 2020-07-07: qty 60, 30d supply, fill #0
  Filled 2020-08-15: qty 60, 30d supply, fill #1
  Filled 2020-09-13: qty 60, 30d supply, fill #2
  Filled 2020-10-05 – 2020-10-06 (×2): qty 60, 30d supply, fill #3
  Filled 2020-11-09: qty 60, 30d supply, fill #4
  Filled 2020-12-03: qty 60, 30d supply, fill #5
  Filled 2021-01-01: qty 60, 30d supply, fill #6
  Filled 2021-01-25: qty 60, 30d supply, fill #7
  Filled 2021-03-02: qty 60, 30d supply, fill #8
  Filled 2021-03-26: qty 60, 30d supply, fill #9

## 2020-07-10 ENCOUNTER — Other Ambulatory Visit (HOSPITAL_COMMUNITY): Payer: Self-pay

## 2020-07-23 IMAGING — CR DG CHEST 2V
2 series · 2 of 2 positions shown · non-contrast
Comparison: 07/17/2012

CLINICAL DATA: Pt c/o chest pain with exertion x " a while". He has
seen his PCP for same, reports his EKG is "abnormal", referred to
cardiologist. Today, same chest pain with exertion while working
outside, pt reports is lasted longer than usual.

EXAM:
CHEST - 2 VIEW

[chest pa]
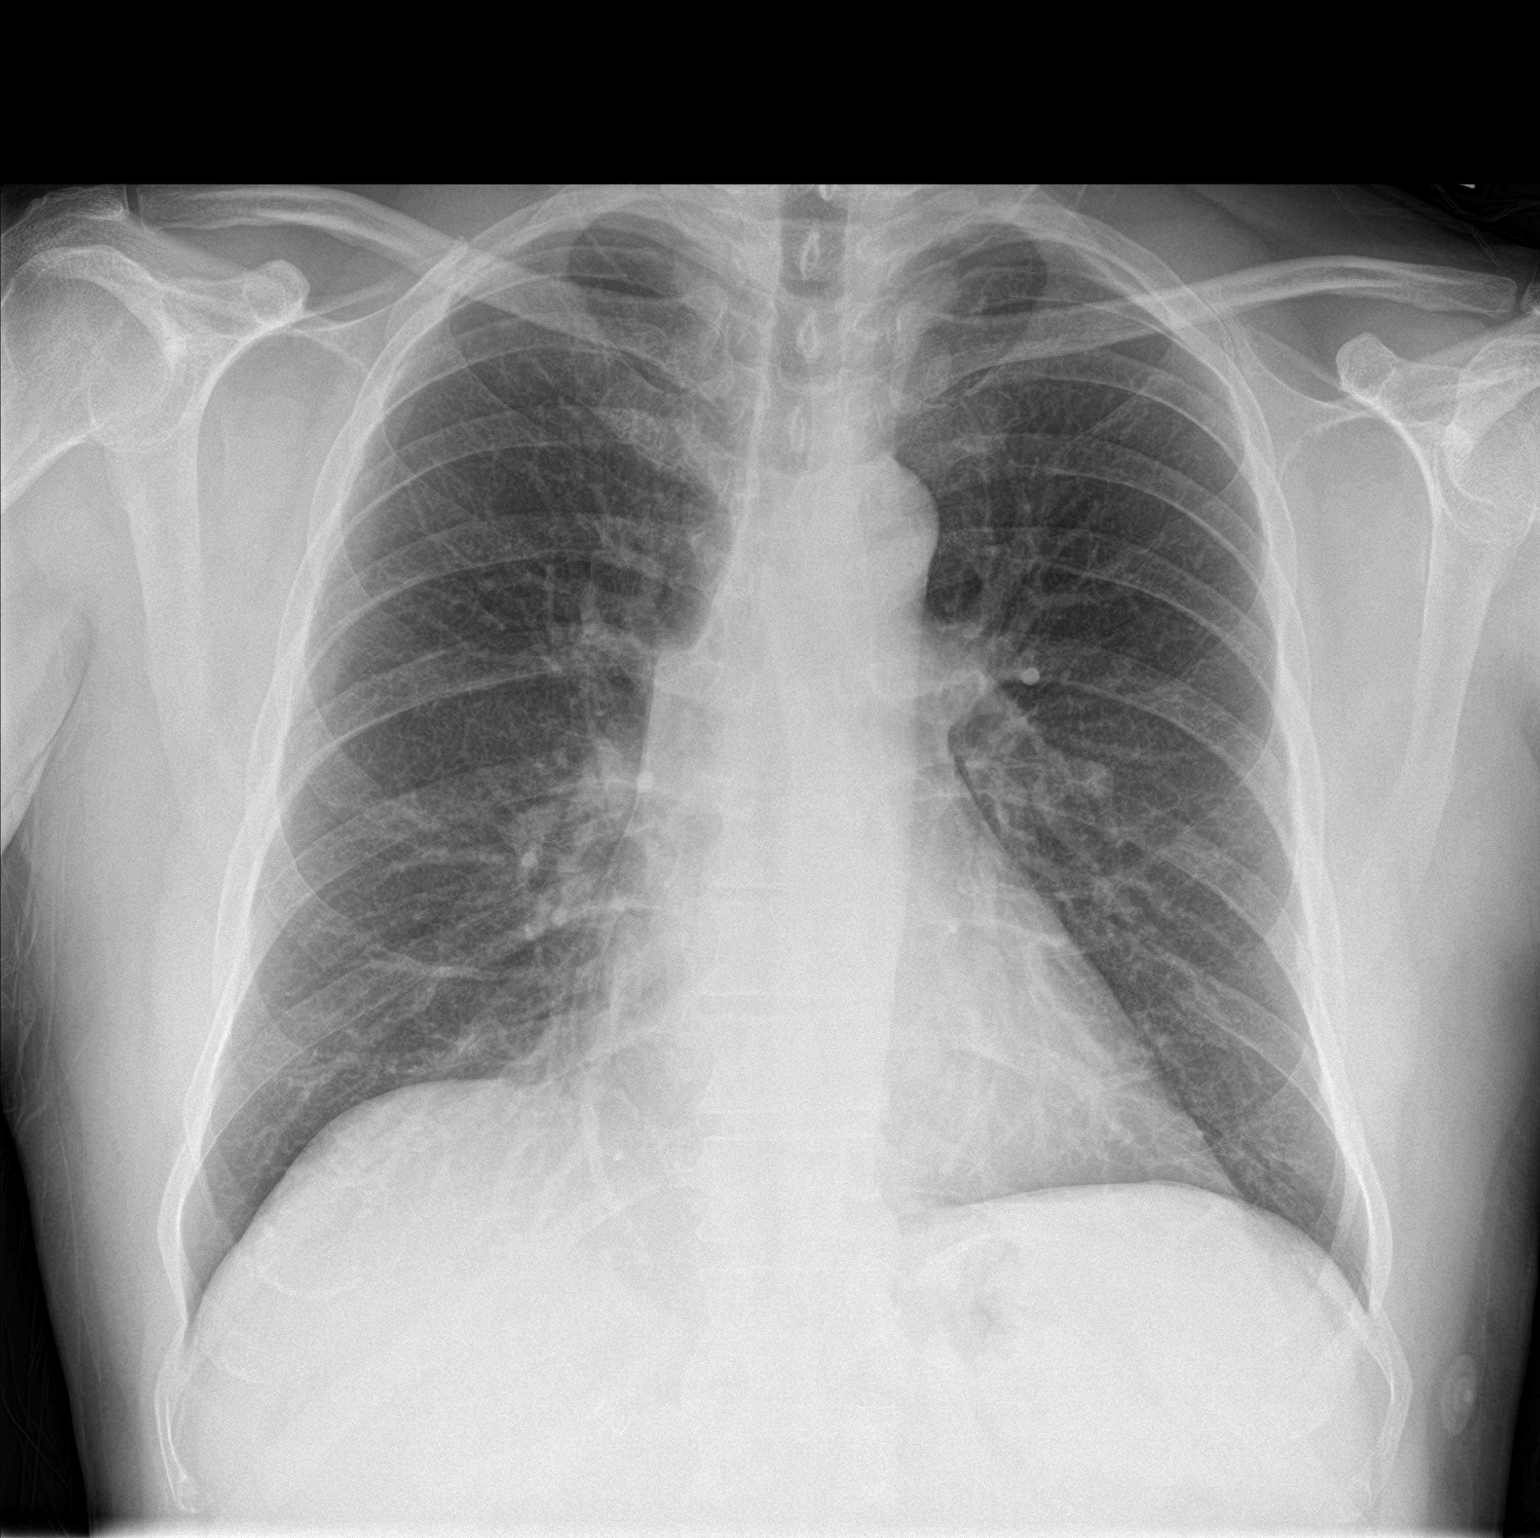

[chest lat]
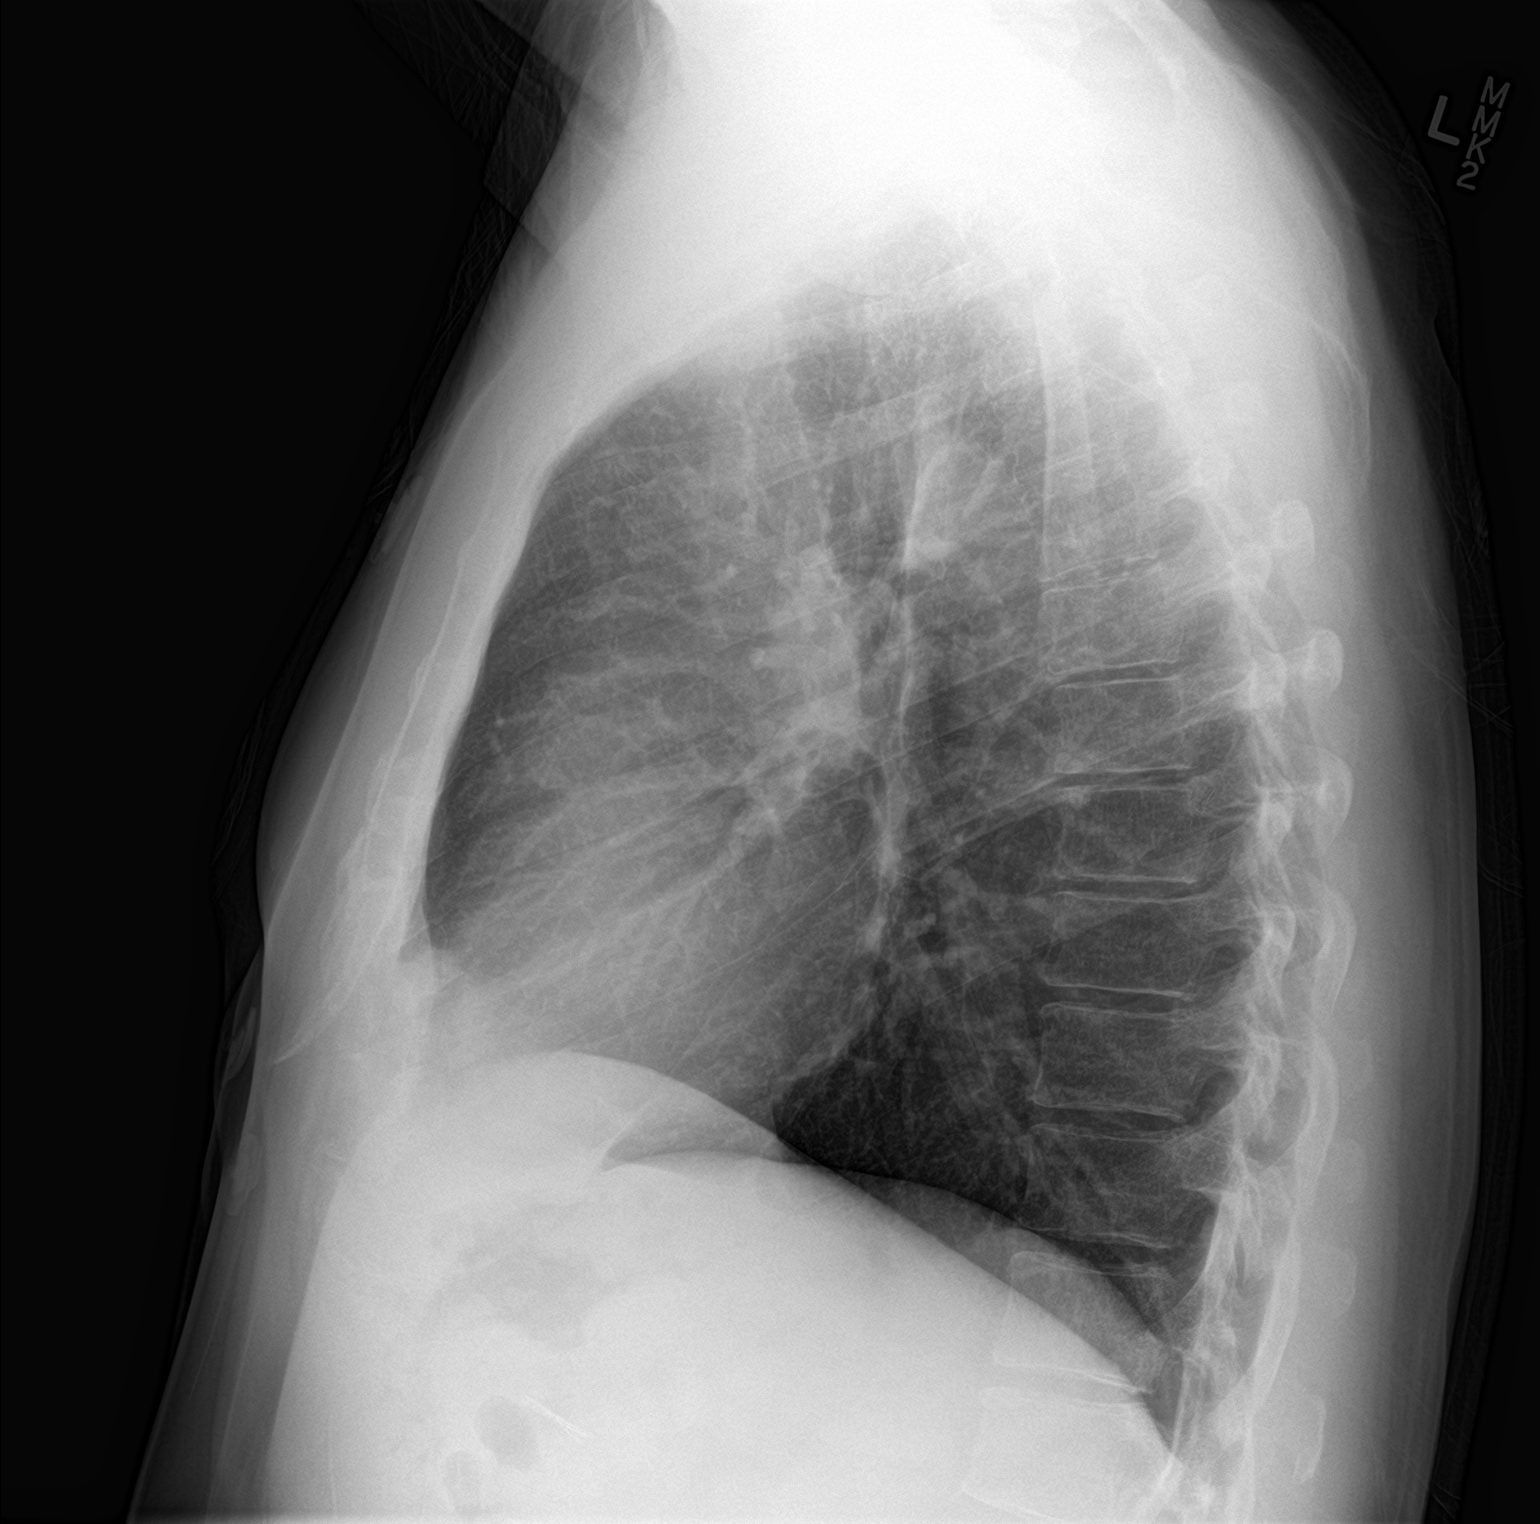

[2 of 2 positions shown; findings below may reference images not displayed]

FINDINGS: The heart size is normal. The lungs are free of focal consolidations
and pleural effusions. No pulmonary edema.
IMPRESSION: No active cardiopulmonary disease.

## 2020-08-04 ENCOUNTER — Other Ambulatory Visit (HOSPITAL_COMMUNITY): Payer: Self-pay

## 2020-08-04 MED FILL — Evolocumab Subcutaneous Soln Auto-Injector 140 MG/ML: SUBCUTANEOUS | 30 days supply | Qty: 2 | Fill #3 | Status: AC

## 2020-08-15 ENCOUNTER — Other Ambulatory Visit (HOSPITAL_COMMUNITY): Payer: Self-pay

## 2020-09-01 MED FILL — Evolocumab Subcutaneous Soln Auto-Injector 140 MG/ML: SUBCUTANEOUS | 30 days supply | Qty: 2 | Fill #4 | Status: AC

## 2020-09-02 ENCOUNTER — Other Ambulatory Visit (HOSPITAL_COMMUNITY): Payer: Self-pay

## 2020-09-04 ENCOUNTER — Other Ambulatory Visit (HOSPITAL_COMMUNITY): Payer: Self-pay

## 2020-09-08 ENCOUNTER — Other Ambulatory Visit (HOSPITAL_COMMUNITY): Payer: Self-pay

## 2020-09-14 ENCOUNTER — Other Ambulatory Visit (HOSPITAL_COMMUNITY): Payer: Self-pay

## 2020-09-25 ENCOUNTER — Other Ambulatory Visit (HOSPITAL_COMMUNITY): Payer: Self-pay

## 2020-09-25 MED FILL — Isosorbide Mononitrate Tab ER 24HR 60 MG: ORAL | 90 days supply | Qty: 90 | Fill #1 | Status: AC

## 2020-09-26 ENCOUNTER — Other Ambulatory Visit (HOSPITAL_COMMUNITY): Payer: Self-pay

## 2020-09-30 ENCOUNTER — Other Ambulatory Visit (HOSPITAL_COMMUNITY): Payer: Self-pay

## 2020-09-30 MED FILL — Evolocumab Subcutaneous Soln Auto-Injector 140 MG/ML: SUBCUTANEOUS | 30 days supply | Qty: 2 | Fill #5 | Status: AC

## 2020-10-05 ENCOUNTER — Other Ambulatory Visit (HOSPITAL_COMMUNITY): Payer: Self-pay

## 2020-10-05 MED FILL — Metoprolol Succinate Tab ER 24HR 100 MG (Tartrate Equiv): ORAL | 90 days supply | Qty: 180 | Fill #1 | Status: AC

## 2020-10-06 ENCOUNTER — Other Ambulatory Visit (HOSPITAL_COMMUNITY): Payer: Self-pay

## 2020-10-07 ENCOUNTER — Other Ambulatory Visit (HOSPITAL_COMMUNITY): Payer: Self-pay

## 2020-10-26 ENCOUNTER — Other Ambulatory Visit (HOSPITAL_COMMUNITY): Payer: Self-pay

## 2020-10-26 MED FILL — Evolocumab Subcutaneous Soln Auto-Injector 140 MG/ML: SUBCUTANEOUS | 30 days supply | Qty: 2 | Fill #6 | Status: AC

## 2020-10-28 ENCOUNTER — Other Ambulatory Visit (HOSPITAL_COMMUNITY): Payer: Self-pay

## 2020-11-09 ENCOUNTER — Other Ambulatory Visit (HOSPITAL_COMMUNITY): Payer: Self-pay

## 2020-11-10 ENCOUNTER — Other Ambulatory Visit (HOSPITAL_COMMUNITY): Payer: Self-pay

## 2020-11-13 ENCOUNTER — Other Ambulatory Visit (HOSPITAL_COMMUNITY): Payer: Self-pay

## 2020-11-13 MED ORDER — ZOLPIDEM TARTRATE 10 MG PO TABS
ORAL_TABLET | ORAL | 1 refills | Status: DC
Start: 2020-11-13 — End: 2022-10-28
  Filled 2020-11-13: qty 90, 90d supply, fill #0
  Filled 2021-02-11: qty 90, 90d supply, fill #1

## 2020-11-13 MED ORDER — ESOMEPRAZOLE MAGNESIUM 40 MG PO CPDR
DELAYED_RELEASE_CAPSULE | ORAL | 3 refills | Status: DC
  Filled 2020-11-13: qty 90, 90d supply, fill #0

## 2020-12-03 ENCOUNTER — Other Ambulatory Visit (HOSPITAL_COMMUNITY): Payer: Self-pay

## 2020-12-07 ENCOUNTER — Other Ambulatory Visit (HOSPITAL_COMMUNITY): Payer: Self-pay

## 2020-12-07 MED FILL — Evolocumab Subcutaneous Soln Auto-Injector 140 MG/ML: SUBCUTANEOUS | 30 days supply | Qty: 2 | Fill #7 | Status: AC

## 2021-01-01 ENCOUNTER — Other Ambulatory Visit: Payer: Self-pay | Admitting: Cardiovascular Disease

## 2021-01-01 ENCOUNTER — Other Ambulatory Visit (HOSPITAL_COMMUNITY): Payer: Self-pay

## 2021-01-01 DIAGNOSIS — E782 Mixed hyperlipidemia: Secondary | ICD-10-CM

## 2021-01-01 MED ORDER — REPATHA SURECLICK 140 MG/ML ~~LOC~~ SOAJ
SUBCUTANEOUS | 1 refills | Status: DC
  Filled 2021-01-01: qty 6, 84d supply, fill #0
  Filled 2021-03-26: qty 6, 84d supply, fill #1

## 2021-01-01 MED FILL — Isosorbide Mononitrate Tab ER 24HR 60 MG: ORAL | 90 days supply | Qty: 90 | Fill #2 | Status: AC

## 2021-01-01 MED FILL — Metoprolol Succinate Tab ER 24HR 100 MG (Tartrate Equiv): ORAL | 90 days supply | Qty: 180 | Fill #2 | Status: AC

## 2021-01-25 ENCOUNTER — Other Ambulatory Visit (HOSPITAL_COMMUNITY): Payer: Self-pay

## 2021-01-26 ENCOUNTER — Other Ambulatory Visit (HOSPITAL_COMMUNITY): Payer: Self-pay

## 2021-02-11 ENCOUNTER — Other Ambulatory Visit (HOSPITAL_COMMUNITY): Payer: Self-pay

## 2021-02-19 ENCOUNTER — Other Ambulatory Visit (HOSPITAL_COMMUNITY): Payer: Self-pay

## 2021-02-19 MED ORDER — CORICIDIN HBP 10-325-2 MG PO TABS: ORAL_TABLET | ORAL | 0 refills | Status: DC

## 2021-02-19 MED ORDER — AZITHROMYCIN 250 MG PO TABS
ORAL_TABLET | ORAL | 0 refills | Status: DC
  Filled 2021-02-19: qty 6, 5d supply, fill #0

## 2021-02-19 MED ORDER — ALBUTEROL SULFATE HFA 108 (90 BASE) MCG/ACT IN AERS
INHALATION_SPRAY | RESPIRATORY_TRACT | 0 refills | Status: DC
  Filled 2021-02-19: qty 18, 10d supply, fill #0

## 2021-02-19 MED ORDER — LAGEVRIO 200 MG PO CAPS
ORAL_CAPSULE | ORAL | 0 refills | Status: DC
  Filled 2021-02-19: qty 40, 5d supply, fill #0

## 2021-02-23 ENCOUNTER — Other Ambulatory Visit (HOSPITAL_COMMUNITY): Payer: Self-pay

## 2021-02-23 MED ORDER — ESCITALOPRAM OXALATE 10 MG PO TABS
ORAL_TABLET | ORAL | 1 refills | Status: DC
  Filled 2021-02-23: qty 30, 30d supply, fill #0

## 2021-02-25 ENCOUNTER — Encounter (HOSPITAL_COMMUNITY): Payer: Self-pay

## 2021-02-25 ENCOUNTER — Other Ambulatory Visit: Payer: Self-pay

## 2021-02-25 ENCOUNTER — Emergency Department (HOSPITAL_COMMUNITY): Payer: Managed Care, Other (non HMO)

## 2021-02-25 ENCOUNTER — Emergency Department (HOSPITAL_COMMUNITY)
Admission: EM | Admit: 2021-02-25 | Discharge: 2021-02-25 | Disposition: A | Discharge status: Home / Self Care | Payer: Managed Care, Other (non HMO) | Attending: Emergency Medicine | Admitting: Emergency Medicine

## 2021-02-25 DIAGNOSIS — I10 Essential (primary) hypertension: Secondary | ICD-10-CM | POA: Insufficient documentation

## 2021-02-25 DIAGNOSIS — R079 Chest pain, unspecified: Secondary | ICD-10-CM | POA: Diagnosis present

## 2021-02-25 DIAGNOSIS — Z79899 Other long term (current) drug therapy: Secondary | ICD-10-CM | POA: Diagnosis not present

## 2021-02-25 DIAGNOSIS — R1013 Epigastric pain: Secondary | ICD-10-CM | POA: Diagnosis not present

## 2021-02-25 DIAGNOSIS — Z7982 Long term (current) use of aspirin: Secondary | ICD-10-CM | POA: Insufficient documentation

## 2021-02-25 DIAGNOSIS — R0789 Other chest pain: Secondary | ICD-10-CM

## 2021-02-25 DIAGNOSIS — R110 Nausea: Secondary | ICD-10-CM | POA: Insufficient documentation

## 2021-02-25 DIAGNOSIS — I2510 Atherosclerotic heart disease of native coronary artery without angina pectoris: Secondary | ICD-10-CM | POA: Insufficient documentation

## 2021-02-25 LAB — CBC WITH DIFFERENTIAL/PLATELET
Abs Immature Granulocytes: 0.01 10*3/uL (ref 0.00–0.07)
Basophils Absolute: 0 10*3/uL (ref 0.0–0.1)
Basophils Relative: 0 %
Eosinophils Absolute: 0 10*3/uL (ref 0.0–0.5)
Eosinophils Relative: 0 %
HCT: 42.3 % (ref 39.0–52.0)
Hemoglobin: 14.2 g/dL (ref 13.0–17.0)
Immature Granulocytes: 0 %
Lymphocytes Relative: 16 %
Lymphs Abs: 0.9 10*3/uL (ref 0.7–4.0)
MCH: 30.2 pg (ref 26.0–34.0)
MCHC: 33.6 g/dL (ref 30.0–36.0)
MCV: 90 fL (ref 80.0–100.0)
Monocytes Absolute: 0.5 10*3/uL (ref 0.1–1.0)
Monocytes Relative: 9 %
Neutro Abs: 4.2 10*3/uL (ref 1.7–7.7)
Neutrophils Relative %: 75 %
Platelets: 257 10*3/uL (ref 150–400)
RBC: 4.7 MIL/uL (ref 4.22–5.81)
RDW: 11.8 % (ref 11.5–15.5)
WBC: 5.5 10*3/uL (ref 4.0–10.5)
nRBC: 0 % (ref 0.0–0.2)

## 2021-02-25 LAB — COMPREHENSIVE METABOLIC PANEL
ALT: 29 U/L (ref 0–44)
AST: 32 U/L (ref 15–41)
Albumin: 4.1 g/dL (ref 3.5–5.0)
Alkaline Phosphatase: 45 U/L (ref 38–126)
Anion gap: 10 (ref 5–15)
BUN: 10 mg/dL (ref 6–20)
CO2: 22 mmol/L (ref 22–32)
Calcium: 9 mg/dL (ref 8.9–10.3)
Chloride: 104 mmol/L (ref 98–111)
Creatinine, Ser: 0.95 mg/dL (ref 0.61–1.24)
GFR, Estimated: >60 mL/min (ref >60)
Glucose, Bld: 110 mg/dL — ABNORMAL HIGH (ref 70–99)
Potassium: 3.7 mmol/L (ref 3.5–5.1)
Sodium: 136 mmol/L (ref 135–145)
Total Bilirubin: 1 mg/dL (ref 0.3–1.2)
Total Protein: 6.6 g/dL (ref 6.5–8.1)

## 2021-02-25 LAB — TROPONIN I (HIGH SENSITIVITY)
Troponin I (High Sensitivity): 3 ng/L (ref <18)
Troponin I (High Sensitivity): 3 ng/L (ref <18)

## 2021-02-25 LAB — LIPASE, BLOOD: Lipase: 35 U/L (ref 11–51)

## 2021-02-25 MED ORDER — HYDROXYZINE HCL 25 MG PO TABS
25.0000 mg | ORAL_TABLET | Freq: Three times a day (TID) | ORAL | 0 refills | Status: DC | PRN
  Filled 2021-02-25: qty 20, 7d supply, fill #0

## 2021-02-25 MED ORDER — ALUM & MAG HYDROXIDE-SIMETH 200-200-20 MG/5ML PO SUSP
30.0000 mL | Freq: Once | ORAL | Status: AC
  Administered 2021-02-25: 30 mL via ORAL
  Filled 2021-02-25: qty 30

## 2021-02-25 MED ORDER — ISOSORBIDE MONONITRATE ER 60 MG PO TB24
90.0000 mg | ORAL_TABLET | Freq: Every day | ORAL | 0 refills | Status: DC
  Filled 2021-02-25: qty 30, 20d supply, fill #0

## 2021-02-25 MED ORDER — LIDOCAINE VISCOUS HCL 2 % MT SOLN
15.0000 mL | Freq: Once | OROMUCOSAL | Status: AC
Start: 2021-02-25 — End: 2021-02-25
  Administered 2021-02-25: 15 mL via ORAL
  Filled 2021-02-25: qty 15

## 2021-02-25 MED ORDER — HYDROXYZINE HCL 25 MG PO TABS
25.0000 mg | ORAL_TABLET | Freq: Once | ORAL | Status: AC
  Administered 2021-02-25: 25 mg via ORAL
  Filled 2021-02-25: qty 1

## 2021-02-25 MED ORDER — ONDANSETRON HCL 4 MG/2ML IJ SOLN
4.0000 mg | Freq: Once | INTRAMUSCULAR | Status: AC
Start: 2021-02-25 — End: 2021-02-25
  Administered 2021-02-25: 4 mg via INTRAVENOUS
  Filled 2021-02-25: qty 2

## 2021-02-25 NOTE — ED Triage Notes (Signed)
Covid positive day 6 coming from home called out for CP upper epigastric pain radiates through to the back started 2 days ago off and on got worse today Nausea and dizziness.

## 2021-02-25 NOTE — ED Provider Notes (Signed)
[facility] EMERGENCY DEPARTMENT Provider Note   CSN: [id number] Arrival date & time: 02/25/21  1646     History  Chief Complaint  Patient presents with   Chest Pain    David Conner is a 61 y.o. male. With past medical history of HLD, HTN, CAD and NSTEMI s/p x3 stent placement 2019 who presents to the emergency department with chest pain.  Patient states that for 2-3 days how he has had a "discomfort" in his epigastrium and under both ribs that intermittently radiates to the right scapula. He states initially he thought it was indigestion and thought no more of it. He states when it returned, he thought it was maybe his anxiety because he felt "uneasy" and asked his PCP to restart his Lexapro. He states that today he woke up with similar symptoms, and felt nauseated. States that his blood pressure was increased to 631S systolic (normal around 970) and decided to present to the emergency department. He denies palpitations, LE edema, shortness of breath. He denies vomiting, diarrhea or fevers. He recently was diagnosed with COVID last Thursday, but states his symptoms were sinus pain and cough which have resolved. Continues to take Brillanta.    Chest Pain Associated symptoms: abdominal pain and nausea   Associated symptoms: no cough, no diaphoresis, no fever, no palpitations, no shortness of breath and no vomiting       Home Medications Prior to Admission medications   Medication Sig Start Date End Date Taking? Authorizing Provider  albuterol (VENTOLIN HFA) 108 (90 Base) MCG/ACT inhaler Inhale 1 puff into the lungs as needed  every 4 hrs for 10 days 02/19/21     aspirin EC 81 MG tablet Take 81 mg by mouth daily.     [provider]  azithromycin (ZITHROMAX) 250 MG tablet Take 2 tablets by mouth on day 1 then take 1 tablet by mouth daily for 4 days. 02/19/21     DM-APAP-CPM (CORICIDIN HBP) 10-325-2 MG TABS Take 2 tablets by mouth every 4 hours as needed  02/19/21     escitalopram (LEXAPRO) 10 MG tablet Take 1 tablet (10 mg total) by mouth every morning. 07/06/20     escitalopram (LEXAPRO) 10 MG tablet Take 1 tablet by mouth daily 02/23/21     escitalopram (LEXAPRO) 20 MG tablet Take 20 mg by mouth daily.    [provider]  escitalopram (LEXAPRO) 20 MG tablet TAKE 1 TABLET BY MOUTH EVERY MORNING 09/09/19 09/08/20  Linward Headland, NP  esomeprazole (NEXIUM) 40 MG capsule Take 40 mg by mouth daily.     [provider]  esomeprazole (NEXIUM) 40 MG capsule Take one capsule by mouth daily. 11/13/20     Evolocumab (REPATHA SURECLICK) 263 MG/ML SOAJ INJECT 1 PEN INTO THE SKIN EVERY 14 DAYS 01/01/21   Nahser, Wonda Cheng, MD  isosorbide mononitrate (IMDUR) 60 MG 24 hr tablet TAKE 1 TABLET BY MOUTH ONCE A DAY 04/10/20 04/10/21  Nahser, Wonda Cheng, MD  metoprolol succinate (TOPROL-XL) 100 MG 24 hr tablet TAKE 1 TABLET BY MOUTH TWO TIMES DAILY WITH OR IMMEDIATELY FOLLOWING A MEAL 04/10/20 04/10/21  Nahser, Wonda Cheng, MD  molnupiravir EUA (LAGEVRIO) 200 MG CAPS capsule Take 4 capsules by mouth every 12 hours for 5 days 02/19/21     nitroGLYCERIN (NITROSTAT) 0.3 MG SL tablet PLACE 1 TABLET UNDER THE TONGUE EVERY 5 MINUTES AS NEEDED FOR CHEST PAIN. 11/14/19 11/13/20  Nahser, Wonda Cheng, MD  ticagrelor (BRILINTA) 90 MG TABS  tablet TAKE 1 TABLET BY MOUTH TWO TIMES DAILY 07/07/20   Nahser, Wonda Cheng, MD  zolpidem (AMBIEN) 10 MG tablet Take 10 mg by mouth at bedtime. 11/08/17   [provider]  zolpidem (AMBIEN) 10 MG tablet TAKE 1 TABLET BY MOUTH AT BEDTIME AS NEEDED 08/28/19 02/24/20  Orpah Melter, MD  zolpidem (AMBIEN) 10 MG tablet Take 1 tablet (10 mg total) by mouth at bedtime as needed. 05/06/20     zolpidem (AMBIEN) 10 MG tablet Take one tablet by mouth as needed at bedtime. 11/13/20         Allergies    Lansoprazole, Ofloxacin, Penicillin g, and Versed [midazolam]    Review of Systems   Review of Systems  Constitutional:  Negative for  diaphoresis and fever.  Respiratory:  Negative for cough and shortness of breath.   Cardiovascular:  Positive for chest pain. Negative for palpitations and leg swelling.  Gastrointestinal:  Positive for abdominal pain and nausea. Negative for diarrhea and vomiting.   Physical Exam Updated Vital Signs BP (!) 147/91    Pulse 77    Temp 98.6 F (37 C) (Oral)    Resp 15    Ht 5\' 10"  (1.778 m)    Wt 108 kg    SpO2 97%    BMI 34.16 kg/m  Physical Exam Vitals and nursing note reviewed.  Constitutional:      General: He is not in acute distress.    Appearance: Normal appearance. He is well-developed. He is not ill-appearing or toxic-appearing.  HENT:     Head: Normocephalic and atraumatic.     Nose: Nose normal.     Mouth/Throat:     Mouth: Mucous membranes are moist.     Pharynx: Oropharynx is clear.  Eyes:     General: No scleral icterus.    Extraocular Movements: Extraocular movements intact.     Pupils: Pupils are equal, round, and reactive to light.  Neck:     Vascular: No JVD.  Cardiovascular:     Rate and Rhythm: Normal rate and regular rhythm.     Pulses:          Radial pulses are 2+ on the right side and 2+ on the left side.       Posterior tibial pulses are 1+ on the right side and 1+ on the left side.     Heart sounds: Normal heart sounds. No murmur heard. Pulmonary:     Effort: Pulmonary effort is normal. No tachypnea, accessory muscle usage or respiratory distress.     Breath sounds: Normal breath sounds.  Chest:     Chest wall: No tenderness.  Abdominal:     General: Bowel sounds are normal. There is no distension.     Palpations: Abdomen is soft.     Tenderness: There is no abdominal tenderness. There is no guarding.  Musculoskeletal:        General: Normal range of motion.     Cervical back: Normal range of motion and neck supple.     Right lower leg: No tenderness. No edema.     Left lower leg: No tenderness. No edema.  Skin:    General: Skin is warm and dry.      Capillary Refill: Capillary refill takes less than 2 seconds.     Findings: No rash.  Neurological:     General: No focal deficit present.     Mental Status: He is alert and oriented to person, place, and time. Mental status  is at baseline.  Psychiatric:        Mood and Affect: Mood normal.        Behavior: Behavior normal.        Thought Content: Thought content normal.        Judgment: Judgment normal.    ED Results / Procedures / Treatments   Labs (all labs ordered are listed, but only abnormal results are displayed) Labs Reviewed  COMPREHENSIVE METABOLIC PANEL - Abnormal; Notable for the following components:      Result Value   Glucose, Bld 110 (*)    All other components within normal limits  LIPASE, BLOOD  CBC WITH DIFFERENTIAL/PLATELET  TROPONIN I (HIGH SENSITIVITY)  TROPONIN I (HIGH SENSITIVITY)   EKG None  Radiology DG Chest 2 View  Result Date: 02/25/2021 CLINICAL DATA:  Chest pain EXAM: CHEST - 2 VIEW COMPARISON:  11/11/2017 FINDINGS: The heart size and mediastinal contours are within normal limits. Both lungs are clear. The visualized skeletal structures are unremarkable. IMPRESSION: No active cardiopulmonary disease. Electronically Signed   By: Donavan Foil M.D.   On: 02/25/2021 18:01    Procedures Procedures   Medications Ordered in ED Medications  ondansetron (ZOFRAN) injection 4 mg (has no administration in time range)    ED Course/ Medical Decision Making/ A&P                           Medical Decision Making Amount and/or Complexity of Data Reviewed Labs: ordered. Radiology: ordered.  Risk Prescription drug management.  Patient presents to the ED with complaints of chest pain. This involves an extensive number of treatment options, and is a complaint that carries with it a high risk of complications and morbidity.   Additional history obtained:  Additional history obtained from: n/a External records from outside source obtained and  reviewed including: previous cardiology notes  EKG: EKG: normal sinus rhythm, RVH   Cardiac Monitoring: The patient was maintained on a cardiac monitor.  I personally viewed and interpreted the cardiac monitored which showed an underlying rhythm of: sinus rhythm   Lab Results: I personally ordered, reviewed, and interpreted labs. Pertinent results include: CBC within normal limits  CMP within normal limits Lipase negative  Troponin 3, repeat pending    Imaging Studies ordered:  I ordered imaging studies which included x-ray.  I independently reviewed & interpreted imaging & am in agreement with radiology impression. Imaging shows: CXR without abnormality  Tests Considered:   Consultations: I requested consultation with the cardiologist, Dr. Johney Frame at [phone],  and discussed lab and imaging findings as well as pertinent plan - they recommend: if delta troponin negative, can increase Imdur to 90mg  daily. Feel he would otherwise be appropriate for close outpatient f/u with primary cardiologist, Dr. Acie Fredrickson next week. States she will place communication to his team to set up appointment.   ED Course: 61 year old male who presents to the emergency department with chest pain.  PE without evidence of FVO, doubt heart failure as component of symptoms. EKG without active ischemia. Given duration of symptoms, troponin 3, doubt NSTEMI  Presentation not consistent with acute PE. PERC 1 due to age, Rock Nephew low risk, defer d-dimer  CXR without pneumothorax or pneumonia.  Symptoms inconsistent with myocarditis. Had recent infection, but trop negative. Symptoms inconsistent with pericarditis, tamponade. Inconsistent with thoracic aortic dissection. He did complain of back pain, however it lateralizes to the right side. No neurological symptoms.  HEAR Score: 3  On reassessment patient describes mid abdominal pain as his chest pain. States that is is increased to 3 or 4/10. He describes it as  pinching. He feels as though he cannot get comfortable. Having mild nausea without vomiting. Given 4mg  Zofran.  Care of patient being handed off to Dr. Roslynn Amble at this time. Patient pending delta troponin. If negative, patient can likely be discharged with increased Imdur to 90mg /daily and outpatient f/u with Dr. Acie Fredrickson early next week. Final diagnosis and disposition pending attending evaluation. Please see his note for the rest of patient course.   Final Clinical Impression(s) / ED Diagnoses Final diagnoses:  None    Rx / DC Orders ED Discharge Orders     None         Mickie Hillier, PA-C 02/25/21 1907    Lucrezia Starch, MD 02/25/21 2137

## 2021-02-25 NOTE — Discharge Instructions (Signed)
The cardiologist on-call recommended increasing your Imdur to 90 mg daily and following up closely in the cardiology clinic.  Please contact the clinic number tomorrow morning to request close follow-up appointment.  May also take the Atarax as needed for anxiety.  If you feel you are having worsening chest pain, difficulty breathing or other new concerning symptoms, please return to the emergency room for reassessment.

## 2021-02-26 ENCOUNTER — Telehealth: Payer: Self-pay | Admitting: *Deleted

## 2021-02-26 ENCOUNTER — Other Ambulatory Visit (HOSPITAL_COMMUNITY): Payer: Self-pay

## 2021-02-26 NOTE — Telephone Encounter (Signed)
Pt is scheduled to see Dr. Acie Fredrickson for 03/05/21 at 1:40 pm, for post ER follow-up.  Pt made aware of appt date and time by Scheduling Dept.

## 2021-02-26 NOTE — Telephone Encounter (Signed)
-----   Message from Nuala Alpha, LPN sent at 1/62/4469  7:47 AM EST ----- Regarding: ARRANGE FOLLOW-UP PER DR. Johney Frame WITH [facility] OR AN APP As indicated below, Dr. Johney Frame wants this pt to be scheduled with Dr. Acie Fredrickson sometime soon for post ER follow-up.  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  Can you please call and arrange and let triage know the date?  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 Do you mind just getting him an appointment soon to ensure his symptoms are improving?  Thank you!!  -Nira Conn

## 2021-02-28 ENCOUNTER — Other Ambulatory Visit: Payer: Self-pay | Admitting: Cardiovascular Disease

## 2021-03-01 ENCOUNTER — Other Ambulatory Visit (HOSPITAL_COMMUNITY): Payer: Self-pay

## 2021-03-01 MED ORDER — NITROGLYCERIN 0.3 MG SL SUBL
0.3000 mg | SUBLINGUAL_TABLET | SUBLINGUAL | 0 refills | Status: DC | PRN
  Filled 2021-03-01: qty 100, 90d supply, fill #0

## 2021-03-02 ENCOUNTER — Other Ambulatory Visit (HOSPITAL_COMMUNITY): Payer: Self-pay

## 2021-03-03 ENCOUNTER — Other Ambulatory Visit (HOSPITAL_COMMUNITY): Payer: Self-pay

## 2021-03-03 MED ORDER — ALPRAZOLAM 0.5 MG PO TABS
ORAL_TABLET | ORAL | 0 refills | Status: DC
  Filled 2021-03-03: qty 14, 14d supply, fill #0

## 2021-03-04 ENCOUNTER — Telehealth: Payer: Self-pay | Admitting: Cardiovascular Disease

## 2021-03-04 NOTE — Telephone Encounter (Signed)
°  Pt c/o of Chest Pain: STAT if CP now or developed within 24 hours  1. Are you having CP right now? No   2. Are you experiencing any other symptoms (ex. SOB, nausea, vomiting, sweating)? Elevated BP 140/90  3. How long have you been experiencing CP? Few days  4. Is your CP continuous or coming and going? Coming and going   5. Have you taken Nitroglycerin? Yes  Pt said he's not been feeling good, his BP is elevated and been having chest tightness, he took 2 nitroglycerin today ?

## 2021-03-04 NOTE — Telephone Encounter (Signed)
Patient stated he had Covid 15 days ago. Patient stated that he has had a rough time since. Patient stated he no longer has Covid, but now he is having issues with chest pain and was evaluated late last  week in the ED, patient has history of CAD and has stents, test in ED were normal. Patient stated his last cath was in 2019 and he thinks he needs another cath. Patient wants to be seen sooner than his appointment tomorrow afternoon. Informed patient that the earliest I have is tomorrow at 8:30 with our DOD. Patient stated he would like that appointment instead of the afternoon with Dr. Acie Fredrickson. Patient is very worked up and seems anxious. Encouraged patient to go to the ED if his symptoms get worse.  Will let Dr. Acie Fredrickson know.

## 2021-03-05 ENCOUNTER — Ambulatory Visit: Payer: Managed Care, Other (non HMO) | Admitting: Internal Medicine

## 2021-03-05 ENCOUNTER — Other Ambulatory Visit: Payer: Self-pay

## 2021-03-05 ENCOUNTER — Other Ambulatory Visit (HOSPITAL_COMMUNITY): Payer: Self-pay

## 2021-03-05 ENCOUNTER — Encounter: Payer: Self-pay | Admitting: Internal Medicine

## 2021-03-05 ENCOUNTER — Ambulatory Visit: Payer: Managed Care, Other (non HMO) | Admitting: Cardiovascular Disease

## 2021-03-05 VITALS — BP 130/62 | HR 69 | Ht 70.5 in | Wt 218.0 lb

## 2021-03-05 DIAGNOSIS — I10 Essential (primary) hypertension: Secondary | ICD-10-CM | POA: Diagnosis not present

## 2021-03-05 DIAGNOSIS — E782 Mixed hyperlipidemia: Secondary | ICD-10-CM | POA: Diagnosis not present

## 2021-03-05 DIAGNOSIS — I25110 Atherosclerotic heart disease of native coronary artery with unstable angina pectoris: Secondary | ICD-10-CM | POA: Diagnosis not present

## 2021-03-05 DIAGNOSIS — I2510 Atherosclerotic heart disease of native coronary artery without angina pectoris: Secondary | ICD-10-CM

## 2021-03-05 DIAGNOSIS — Z9861 Coronary angioplasty status: Secondary | ICD-10-CM

## 2021-03-05 MED ORDER — ISOSORBIDE MONONITRATE ER 120 MG PO TB24
120.0000 mg | ORAL_TABLET | Freq: Every day | ORAL | 3 refills | Status: DC
  Filled 2021-03-05: qty 90, 90d supply, fill #0

## 2021-03-05 NOTE — Patient Instructions (Addendum)
Medication Instructions:  Your physician has recommended you make the following change in your medication:  INCREASE: isosorbide mononitrate (Imdur) to 120 mg by mouth once daily  *If you need a refill on your cardiac medications before your next appointment, please call your pharmacy*   Lab Work: NONE If you have labs (blood work) drawn today and your tests are completely normal, you will receive your results only by: [place] (if you have MyChart) OR A paper copy in the mail If you have any lab test that is abnormal or we need to change your treatment, we will call you to review the results.   Testing/Procedures: Your physician has requested that you have a cardiac catheterization. Cardiac catheterization is used to diagnose and/or treat various heart conditions. Doctors may recommend this procedure for a number of different reasons. The most common reason is to evaluate chest pain. Chest pain can be a symptom of coronary artery disease (CAD), and cardiac catheterization can show whether plaque is narrowing or blocking your hearts arteries. This procedure is also used to evaluate the valves, as well as measure the blood flow and oxygen levels in different parts of your heart. For further information please visit HugeFiesta.tn. Please follow instruction sheet, as given.    Follow-Up: At [facility], you and your health needs are our priority.  As part of our continuing mission to provide you with exceptional heart care, we have created designated Provider Care Teams.  These Care Teams include your primary Cardiologist (physician) and Advanced Practice Providers (APPs -  Physician Assistants and Nurse Practitioners) who all work together to provide you with the care you need, when you need it.   Your next appointment:   1 month(s)  The format for your next appointment:   In Person  Provider:   Mertie Moores, MD  or Robbie Lis, PA-C, Nicholes Rough, PA-C, Melina Copa, PA-C,  Ermalinda Barrios, PA-C, Christen Bame, NP, or Richardson Dopp, [place]         Other Instructions   [facility] [facility] OFFICE [place], [place] [address] Dept: [place]: [phone]  AYODELE SANGALANG  03/05/2021  You are scheduled for a Cardiac Catheterization on Monday, February 20 with Dr. Harrell Gave End.  1. Please arrive at the [facility] (Main Entrance A) at [facility]: [place], [place] [address] at 7:00 AM (This time is two hours before your procedure to ensure your preparation). Free valet parking service is available.   Special note: Every effort is made to have your procedure done on time. Please understand that emergencies sometimes delay scheduled procedures.  2. Diet: Do not eat solid foods after midnight.  The patient may have clear liquids until 5am upon the day of the procedure.  3. Labs: Drawn 02/25/21  4. Medication instructions in preparation for your procedure:   Contrast Allergy: No   On the morning of your procedure, take your Brilinta/Ticagrelor and any morning medicines NOT listed above.  You may use sips of water.  5. Plan for one night stay--bring personal belongings. 6. Bring a current list of your medications and current insurance cards. 7. You MUST have a responsible person to drive you home. 8. Someone MUST be with you the first 24 hours after you arrive home or your discharge will be delayed. 9. Please wear clothes that are easy to get on and off and wear slip-on shoes.  Thank  you for allowing [place] to care for you!   -- [place] [facility] services

## 2021-03-05 NOTE — H&P (View-Only) (Signed)
Cardiology Office Note:    Date:  03/05/2021   ID:  David Conner, DOB [birth year], MRN [id number]  PCP:  Orpah Melter, MD   [facility] Providers Cardiologist:  Mertie Moores, MD     Referring MD: Orpah Melter, MD   CC: DOD CP  History of Present Illness:    David Conner is a 61 y.o. male with a hx of CAD and recent COVID-27.   Patient notes that he is doing poorly.   Patient had COVID-19 two weeks ok. Had concurrent increase in his blood pressure. Went to the ED, BP was up to 170. Had had three different episodes of BP that high. Has needed nitro three different times this week. Wanted to make sure it is is not anxiety.  Notes new exertional chest pain.  This keeps him from exercise.  He has new exertional pain of only half a mile walk.  No rest pain.  Pain occurs when his blood pressure is elevated  Ambulatory blood pressure 150-170.   Past Medical History:  Diagnosis Date   Allergy    Complication of anesthesia    upper gi done, had anxiety with versed   Coronary artery disease    11/13/17:PCI to mid LAD using Resolute Onyx 3.0 x 18 mm DES   Depression    slight, on sertraline   ED (erectile dysfunction)    GERD (gastroesophageal reflux disease)    Hyperlipidemia    Nausea occasional   Restless legs syndrome     Past Surgical History:  Procedure Laterality Date   CARDIAC CATHETERIZATION     CHOLECYSTECTOMY N/A 02/18/2013   Procedure: LAPAROSCOPIC CHOLECYSTECTOMY WITH INTRAOPERATIVE CHOLANGIOGRAM, DIAGNOSTIC LAPAROSCOPY, LYSIS OF ADHESIONS ;  Surgeon: [name], MD;  Location: [facility];  Service: General;  Laterality: N/A;   colonscopy  2 years ago   CORONARY STENT INTERVENTION  11/13/2017   CORONARY STENT INTERVENTION N/A 11/13/2017   Procedure: CORONARY STENT INTERVENTION;  Surgeon: Nelva Bush, MD;  Location: [place] [facility];  Service: Cardiovascular;  Laterality: N/A;   cortisone injection Left 3 weeks ago   dr thinks is  bursitis   esophagus stretching     7-8 times   LAPAROSCOPIC APPENDECTOMY N/A 02/18/2013   Procedure: APPENDECTOMY LAPAROSCOPIC;  Surgeon: [name], MD;  Location: [facility];  Service: General;  Laterality: N/A;  DR Redmond Pulling SPOKE WITH PT FAMILY DURING SURGERY ABOUT REMOVING PT APPENDIX DUE TO MALROTATION. FAMILY STATED TO PROCEDE WITH REMOVAL OF APPENDIX.    LEFT HEART CATH AND CORONARY ANGIOGRAPHY N/A 11/13/2017   Procedure: LEFT HEART CATH AND CORONARY ANGIOGRAPHY;  Surgeon: Nelva Bush, MD;  Location: [place] [facility];  Service: Cardiovascular;  Laterality: N/A;   LEFT HEART CATH AND CORONARY ANGIOGRAPHY N/A 12/12/2017   Procedure: LEFT HEART CATH AND CORONARY ANGIOGRAPHY;  Surgeon: Martinique, Peter M, MD;  Location: [place] [facility];  Service: Cardiovascular;  Laterality: N/A;    Current Medications: Current Meds  Medication Sig   ALPRAZolam (XANAX) 0.5 MG tablet Take 1 tablet by mouth daily as needed for severe anxiety.   aspirin EC 81 MG tablet Take 81 mg by mouth daily.    escitalopram (LEXAPRO) 10 MG tablet Take 1 tablet by mouth daily   esomeprazole (NEXIUM) 40 MG capsule Take one capsule by mouth daily.   Evolocumab (REPATHA SURECLICK) 332 MG/ML SOAJ INJECT 1 PEN INTO THE SKIN EVERY 14 DAYS   hydrOXYzine (ATARAX) 25 MG tablet Take 1 tablet  by  mouth every 8  hours as needed for anxiety.   isosorbide mononitrate (IMDUR) 120 MG 24 hr tablet Take 1 tablet by mouth daily.   metoprolol succinate (TOPROL-XL) 100 MG 24 hr tablet TAKE 1 TABLET BY MOUTH TWO TIMES DAILY WITH OR IMMEDIATELY FOLLOWING A MEAL   nitroGLYCERIN (NITROSTAT) 0.3 MG SL tablet Place 1 tablet under the tongue to dissolve every 5 minutes as needed for chest pain.   ticagrelor (BRILINTA) 90 MG TABS tablet TAKE 1 TABLET BY MOUTH TWO TIMES DAILY   zolpidem (AMBIEN) 10 MG tablet Take 10 mg by mouth at bedtime.   zolpidem (AMBIEN) 10 MG tablet Take one tablet by mouth as needed at bedtime.   [DISCONTINUED] isosorbide  mononitrate (IMDUR) 60 MG 24 hr tablet Take 1 and 1/2 tablets  by mouth daily.     Allergies:   Lansoprazole, Ofloxacin, Penicillin g, and Versed [midazolam]   Social History   Socioeconomic History   Marital status: Married    Spouse name: Not on file   Number of children: Not on file   Years of education: Not on file   Highest education level: Master's degree (e.g., MA, MS, MEng, MEd, MSW, MBA)  Occupational History   Not on file  Tobacco Use   Smoking status: Never   Smokeless tobacco: Never  Vaping Use   Vaping Use: Never used  Substance and Sexual Activity   Alcohol use: No   Drug use: No   Sexual activity: Yes    Birth control/protection: Sponge  Other Topics Concern   Not on file  Social History Narrative   Not on file   Social Determinants of Health   Financial Resource Strain: Not on file  Food Insecurity: Not on file  Transportation Needs: Not on file  Physical Activity: Not on file  Stress: Not on file  Social Connections: Not on file     Family History: The patient's family history includes Bladder Cancer in his father; CAD in his maternal grandfather and mother; Diabetes in his father; Healthy in his sister; Hyperlipidemia in his mother; Hypertension in his father and mother.  ROS:   Please see the history of present illness.     All other systems reviewed and are negative.  EKGs/Labs/Other Studies Reviewed:    The following studies were reviewed today:  EKG:  EKG is  ordered today.  The ekg ordered today demonstrates  03/05/21: SR 69   Recent Labs: 02/25/2021: ALT 29; BUN 10; Creatinine, Ser 0.95; Hemoglobin 14.2; Platelets 257; Potassium 3.7; Sodium 136  Recent Lipid Panel    Component Value Date/Time   CHOL 119 03/20/2020 0823   TRIG 157 (H) 03/20/2020 0823   HDL 51 03/20/2020 0823   CHOLHDL 2.3 03/20/2020 0823   CHOLHDL 4.7 11/11/2017 2043   VLDL 8 11/11/2017 2043   LDLCALC 42 03/20/2020 0823        Physical Exam:    VS:  BP 130/62     Pulse 69    Ht 5' 10.5" (1.791 m)    Wt 98.9 kg    SpO2 98%    BMI 30.84 kg/m     Wt Readings from Last 3 Encounters:  03/05/21 98.9 kg  02/25/21 108 kg  03/27/20 108.4 kg    Gen: no distress   Neck: No JVD Cardiac: No Rubs or Gallops, no Murmur, RRR +2 bilateral radial pulses Respiratory: Clear to auscultation bilaterally,  normal effort and  respiratory rate GI: Soft, nontender, non-distended  MS: No  edema;  moves all extremities Integument: Skin feels warm Neuro:  At time of evaluation, alert and oriented to person/place/time/situation  Psych: anxious mood and affect   ASSESSMENT:    1. Coronary artery disease involving native coronary artery of native heart with unstable angina pectoris ([place])   2. Essential (primary) hypertension   3. CAD S/P percutaneous coronary angioplasty   4. Mixed hyperlipidemia    PLAN:    Coronary Artery Disease; Obstructive Anxiety recent COVID-19 - symptomatic unstable angina on current regimen (new exertional CP) - anatomy: Prior LAD and OM1, 2 PCI with residual disease that was unamenable to intervention - continue ASA 81 mg; Brilinta was continued because of significant residual disease - continue lipid therapy, goal LDL < 55 (at goal) - continue succinate 100 mg - continue nitrates; increasing his Imdur to 120 mg PO daily  We have discussed potentially sending him to the ED. He does that the last time he went to the ED he was there for some time with nothing being done. We are working to expedite his LHC If he new chest pain is new rest pain,  patient is amenable to ED evaluation, I discussed that our on call # is 24/7  Follow up with Dr. Acie Fredrickson or PA post cath  Risks and benefits of cardiac catheterization have been discussed with the patient.  These include bleeding, infection, kidney damage, stroke, heart attack, death.  The patient understands these risks and is willing to proceed.  Time Spent Directly with Patient:   I have  spent a total of 40 minutes with the patient reviewing notes, imaging, EKGs, labs and examining the patient as well as establishing an assessment and plan that was discussed personally with the patient.  > 50% of time was spent in direct patient care.  Addendum: LHC plans for 03/08/21        Medication Adjustments/Labs and Tests Ordered: Current medicines are reviewed at length with the patient today.  Concerns regarding medicines are outlined above.  Orders Placed This Encounter  Procedures   EKG 12-Lead   Meds ordered this encounter  Medications   isosorbide mononitrate (IMDUR) 120 MG 24 hr tablet    Sig: Take 1 tablet by mouth daily.    Dispense:  90 tablet    Refill:  3    Patient Instructions  Medication Instructions:  Your physician has recommended you make the following change in your medication:  INCREASE: isosorbide mononitrate (Imdur) to 120 mg by mouth once daily  *If you need a refill on your cardiac medications before your next appointment, please call your pharmacy*   Lab Work: NONE If you have labs (blood work) drawn today and your tests are completely normal, you will receive your results only by: [place] (if you have MyChart) OR A paper copy in the mail If you have any lab test that is abnormal or we need to change your treatment, we will call you to review the results.   Testing/Procedures: Your physician has requested that you have a cardiac catheterization. Cardiac catheterization is used to diagnose and/or treat various heart conditions. Doctors may recommend this procedure for a number of different reasons. The most common reason is to evaluate chest pain. Chest pain can be a symptom of coronary artery disease (CAD), and cardiac catheterization can show whether plaque is narrowing or blocking your hearts arteries. This procedure is also used to evaluate the valves, as well as measure the blood flow and oxygen levels in different  parts of your  heart. For further information please visit HugeFiesta.tn. Please follow instruction sheet, as given.    Follow-Up: At [facility], you and your health needs are our priority.  As part of our continuing mission to provide you with exceptional heart care, we have created designated Provider Care Teams.  These Care Teams include your primary Cardiologist (physician) and Advanced Practice Providers (APPs -  Physician Assistants and Nurse Practitioners) who all work together to provide you with the care you need, when you need it.   Your next appointment:   1 month(s)  The format for your next appointment:   In Person  Provider:   Mertie Moores, MD  or Robbie Lis, PA-C, Nicholes Rough, PA-C, Melina Copa, PA-C, Ermalinda Barrios, PA-C, Christen Bame, NP, or Richardson Dopp, [place]         Other Instructions   [place] OFFICE [place], [place] [address] Dept: [place]: [phone]  JARI DIPASQUALE  03/05/2021  You are scheduled for a Cardiac Catheterization on Monday, February 20 with Dr. Harrell Gave End.  1. Please arrive at the [facility] (Main Entrance A) at [facility]: [place], [place] [address] at 7:00 AM (This time is two hours before your procedure to ensure your preparation). Free valet parking service is available.   Special note: Every effort is made to have your procedure done on time. Please understand that emergencies sometimes delay scheduled procedures.  2. Diet: Do not eat solid foods after midnight.  The patient may have clear liquids until 5am upon the day of the procedure.  3. Labs: Drawn 02/25/21  4. Medication instructions in preparation for your procedure:   Contrast Allergy: No   On the morning of your procedure, take your Brilinta/Ticagrelor and any morning medicines NOT listed above.  You may use sips of water.  5. Plan  for one night stay--bring personal belongings. 6. Bring a current list of your medications and current insurance cards. 7. You MUST have a responsible person to drive you home. 8. Someone MUST be with you the first 24 hours after you arrive home or your discharge will be delayed. 9. Please wear clothes that are easy to get on and off and wear slip-on shoes.  Thank you for allowing [place] to care for you!   -- [facility] Invasive Cardiovascular services     Signed, Werner Lean, MD  03/05/2021 9:23 AM    [place]

## 2021-03-05 NOTE — Progress Notes (Signed)
Cardiology Office Note:    Date:  03/05/2021   ID:  David Conner, DOB 1960/09/14, MRN [id number]  PCP:  Orpah Melter, MD   [facility] Providers Cardiologist:  Mertie Moores, MD     Referring MD: Orpah Melter, MD   CC: DOD CP  History of Present Illness:    David Conner is a 61 y.o. male with a hx of CAD and recent COVID-54.   Patient notes that he is doing poorly.   Patient had COVID-19 two weeks ok. Had concurrent increase in his blood pressure. Went to the ED, BP was up to 170. Had had three different episodes of BP that high. Has needed nitro three different times this week. Wanted to make sure it is is not anxiety.  Notes new exertional chest pain.  This keeps him from exercise.  He has new exertional pain of only half a mile walk.  No rest pain.  Pain occurs when his blood pressure is elevated  Ambulatory blood pressure 150-170.   Past Medical History:  Diagnosis Date   Allergy    Complication of anesthesia    upper gi done, had anxiety with versed   Coronary artery disease    11/13/17:PCI to mid LAD using Resolute Onyx 3.0 x 18 mm DES   Depression    slight, on sertraline   ED (erectile dysfunction)    GERD (gastroesophageal reflux disease)    Hyperlipidemia    Nausea occasional   Restless legs syndrome     Past Surgical History:  Procedure Laterality Date   CARDIAC CATHETERIZATION     CHOLECYSTECTOMY N/A 02/18/2013   Procedure: LAPAROSCOPIC CHOLECYSTECTOMY WITH INTRAOPERATIVE CHOLANGIOGRAM, DIAGNOSTIC LAPAROSCOPY, LYSIS OF ADHESIONS ;  Surgeon: [name], MD;  Location: [facility];  Service: General;  Laterality: N/A;   colonscopy  2 years ago   CORONARY STENT INTERVENTION  11/13/2017   CORONARY STENT INTERVENTION N/A 11/13/2017   Procedure: CORONARY STENT INTERVENTION;  Surgeon: Nelva Bush, MD;  Location: [place] [facility];  Service: Cardiovascular;  Laterality: N/A;   cortisone injection Left 3 weeks ago   dr thinks is  bursitis   esophagus stretching     7-8 times   LAPAROSCOPIC APPENDECTOMY N/A 02/18/2013   Procedure: APPENDECTOMY LAPAROSCOPIC;  Surgeon: [name], MD;  [facility];  Service: General;  Laterality: N/A;  DR Redmond Pulling SPOKE WITH PT FAMILY DURING SURGERY ABOUT REMOVING PT APPENDIX DUE TO MALROTATION. FAMILY STATED TO PROCEDE WITH REMOVAL OF APPENDIX.    LEFT HEART CATH AND CORONARY ANGIOGRAPHY N/A 11/13/2017   Procedure: LEFT HEART CATH AND CORONARY ANGIOGRAPHY;  Surgeon: Nelva Bush, MD;  Location: [place] [facility];  Service: Cardiovascular;  Laterality: N/A;   LEFT HEART CATH AND CORONARY ANGIOGRAPHY N/A 12/12/2017   Procedure: LEFT HEART CATH AND CORONARY ANGIOGRAPHY;  Surgeon: Martinique, Peter M, MD;  Location: [place] [facility];  Service: Cardiovascular;  Laterality: N/A;    Current Medications: Current Meds  Medication Sig   ALPRAZolam (XANAX) 0.5 MG tablet Take 1 tablet by mouth daily as needed for severe anxiety.   aspirin EC 81 MG tablet Take 81 mg by mouth daily.    escitalopram (LEXAPRO) 10 MG tablet Take 1 tablet by mouth daily   esomeprazole (NEXIUM) 40 MG capsule Take one capsule by mouth daily.   Evolocumab (REPATHA SURECLICK) 053 MG/ML SOAJ INJECT 1 PEN INTO THE SKIN EVERY 14 DAYS   hydrOXYzine (ATARAX) 25 MG tablet Take 1 tablet  by  mouth every 8  hours as needed for anxiety.   isosorbide mononitrate (IMDUR) 120 MG 24 hr tablet Take 1 tablet by mouth daily.   metoprolol succinate (TOPROL-XL) 100 MG 24 hr tablet TAKE 1 TABLET BY MOUTH TWO TIMES DAILY WITH OR IMMEDIATELY FOLLOWING A MEAL   nitroGLYCERIN (NITROSTAT) 0.3 MG SL tablet Place 1 tablet under the tongue to dissolve every 5 minutes as needed for chest pain.   ticagrelor (BRILINTA) 90 MG TABS tablet TAKE 1 TABLET BY MOUTH TWO TIMES DAILY   zolpidem (AMBIEN) 10 MG tablet Take 10 mg by mouth at bedtime.   zolpidem (AMBIEN) 10 MG tablet Take one tablet by mouth as needed at bedtime.   [DISCONTINUED] isosorbide  mononitrate (IMDUR) 60 MG 24 hr tablet Take 1 and 1/2 tablets  by mouth daily.     Allergies:   Lansoprazole, Ofloxacin, Penicillin g, and Versed [midazolam]   Social History   Socioeconomic History   Marital status: Married    Spouse name: Not on file   Number of children: Not on file   Years of education: Not on file   Highest education level: Master's degree (e.g., MA, MS, MEng, MEd, MSW, MBA)  Occupational History   Not on file  Tobacco Use   Smoking status: Never   Smokeless tobacco: Never  Vaping Use   Vaping Use: Never used  Substance and Sexual Activity   Alcohol use: No   Drug use: No   Sexual activity: Yes    Birth control/protection: Sponge  Other Topics Concern   Not on file  Social History Narrative   Not on file   Social Determinants of Health   Financial Resource Strain: Not on file  Food Insecurity: Not on file  Transportation Needs: Not on file  Physical Activity: Not on file  Stress: Not on file  Social Connections: Not on file     Family History: The patient's family history includes Bladder Cancer in his father; CAD in his maternal grandfather and mother; Diabetes in his father; Healthy in his sister; Hyperlipidemia in his mother; Hypertension in his father and mother.  ROS:   Please see the history of present illness.     All other systems reviewed and are negative.  EKGs/Labs/Other Studies Reviewed:    The following studies were reviewed today:  EKG:  EKG is  ordered today.  The ekg ordered today demonstrates  03/05/21: SR 69   Recent Labs: 02/25/2021: ALT 29; BUN 10; Creatinine, Ser 0.95; Hemoglobin 14.2; Platelets 257; Potassium 3.7; Sodium 136  Recent Lipid Panel    Component Value Date/Time   CHOL 119 03/20/2020 0823   TRIG 157 (H) 03/20/2020 0823   HDL 51 03/20/2020 0823   CHOLHDL 2.3 03/20/2020 0823   CHOLHDL 4.7 11/11/2017 2043   VLDL 8 11/11/2017 2043   LDLCALC 42 03/20/2020 0823        Physical Exam:    VS:  BP 130/62     Pulse 69    Ht 5' 10.5" (1.791 m)    Wt 98.9 kg    SpO2 98%    BMI 30.84 kg/m     Wt Readings from Last 3 Encounters:  03/05/21 98.9 kg  02/25/21 108 kg  03/27/20 108.4 kg    Gen: no distress   Neck: No JVD Cardiac: No Rubs or Gallops, no Murmur, RRR +2 bilateral radial pulses Respiratory: Clear to auscultation bilaterally,  normal effort and  respiratory rate GI: Soft, nontender, non-distended  MS: No  edema;  moves all extremities Integument: Skin feels warm Neuro:  At time of evaluation, alert and oriented to person/place/time/situation  Psych: anxious mood and affect   ASSESSMENT:    1. Coronary artery disease involving native coronary artery of native heart with unstable angina pectoris ([place])   2. Essential (primary) hypertension   3. CAD S/P percutaneous coronary angioplasty   4. Mixed hyperlipidemia    PLAN:    Coronary Artery Disease; Obstructive Anxiety recent COVID-19 - symptomatic unstable angina on current regimen (new exertional CP) - anatomy: Prior LAD and OM1, 2 PCI with residual disease that was unamenable to intervention - continue ASA 81 mg; Brilinta was continued because of significant residual disease - continue lipid therapy, goal LDL < 55 (at goal) - continue succinate 100 mg - continue nitrates; increasing his Imdur to 120 mg PO daily  We have discussed potentially sending him to the ED. He does that the last time he went to the ED he was there for some time with nothing being done. We are working to expedite his LHC If he new chest pain is new rest pain,  patient is amenable to ED evaluation, I discussed that our on call # is [phone]/7  Follow up with Dr. Acie Fredrickson or PA post cath  Risks and benefits of cardiac catheterization have been discussed with the patient.  These include bleeding, infection, kidney damage, stroke, heart attack, death.  The patient understands these risks and is willing to proceed.  Time Spent Directly with Patient:   I have  spent a total of 40 minutes with the patient reviewing notes, imaging, EKGs, labs and examining the patient as well as establishing an assessment and plan that was discussed personally with the patient.  > 50% of time was spent in direct patient care.  Addendum: LHC plans for 03/08/21        Medication Adjustments/Labs and Tests Ordered: Current medicines are reviewed at length with the patient today.  Concerns regarding medicines are outlined above.  Orders Placed This Encounter  Procedures   EKG 12-Lead   Meds ordered this encounter  Medications   isosorbide mononitrate (IMDUR) 120 MG 24 hr tablet    Sig: Take 1 tablet by mouth daily.    Dispense:  90 tablet    Refill:  3    Patient Instructions  Medication Instructions:  Your physician has recommended you make the following change in your medication:  INCREASE: isosorbide mononitrate (Imdur) to 120 mg by mouth once daily  *If you need a refill on your cardiac medications before your next appointment, please call your pharmacy*   Lab Work: NONE If you have labs (blood work) drawn today and your tests are completely normal, you will receive your results only by: [place] (if you have MyChart) OR A paper copy in the mail If you have any lab test that is abnormal or we need to change your treatment, we will call you to review the results.   Testing/Procedures: Your physician has requested that you have a cardiac catheterization. Cardiac catheterization is used to diagnose and/or treat various heart conditions. Doctors may recommend this procedure for a number of different reasons. The most common reason is to evaluate chest pain. Chest pain can be a symptom of coronary artery disease (CAD), and cardiac catheterization can show whether plaque is narrowing or blocking your hearts arteries. This procedure is also used to evaluate the valves, as well as measure the blood flow and oxygen levels in different  parts of your  heart. For further information please visit [email or web address]. Please follow instruction sheet, as given.    Follow-Up: At [facility], you and your health needs are our priority.  As part of our continuing mission to provide you with exceptional heart care, we have created designated Provider Care Teams.  These Care Teams include your primary Cardiologist (physician) and Advanced Practice Providers (APPs -  Physician Assistants and Nurse Practitioners) who all work together to provide you with the care you need, when you need it.   Your next appointment:   1 month(s)  The format for your next appointment:   In Person  Provider:   Mertie Moores, MD  or Robbie Lis, PA-C, Nicholes Rough, PA-C, Melina Copa, PA-C, Ermalinda Barrios, PA-C, Christen Bame, NP, or Richardson Dopp, [place]         Other Instructions   [place] OFFICE [place], [place] [address] Dept: [place]: [phone]  David Conner  03/05/2021  You are scheduled for a Cardiac Catheterization on Monday, February 20 with Dr. Harrell Gave End.  1. Please arrive at the [facility] (Main Entrance A) at [facility]: [place], [place] [address] at 7:00 AM (This time is two hours before your procedure to ensure your preparation). Free valet parking service is available.   Special note: Every effort is made to have your procedure done on time. Please understand that emergencies sometimes delay scheduled procedures.  2. Diet: Do not eat solid foods after midnight.  The patient may have clear liquids until 5am upon the day of the procedure.  3. Labs: Drawn 02/25/21  4. Medication instructions in preparation for your procedure:   Contrast Allergy: No   On the morning of your procedure, take your Brilinta/Ticagrelor and any morning medicines NOT listed above.  You may use sips of water.  5. Plan  for one night stay--bring personal belongings. 6. Bring a current list of your medications and current insurance cards. 7. You MUST have a responsible person to drive you home. 8. Someone MUST be with you the first 24 hours after you arrive home or your discharge will be delayed. 9. Please wear clothes that are easy to get on and off and wear slip-on shoes.  Thank you for allowing [place] to care for you!   -- [facility] Invasive Cardiovascular services     Signed, Werner Lean, MD  03/05/2021 9:23 AM    [place]

## 2021-03-08 ENCOUNTER — Other Ambulatory Visit: Payer: Self-pay

## 2021-03-08 ENCOUNTER — Ambulatory Visit (HOSPITAL_COMMUNITY)
Admission: RE | Admit: 2021-03-08 | Discharge: 2021-03-08 | Disposition: A | Discharge status: Home / Self Care | Payer: Managed Care, Other (non HMO) | Source: Ambulatory Visit | Attending: Internal Medicine | Admitting: Internal Medicine

## 2021-03-08 ENCOUNTER — Encounter (HOSPITAL_COMMUNITY)
Admission: RE | Disposition: A | Discharge status: Home / Self Care | Payer: Self-pay | Source: Ambulatory Visit | Attending: Internal Medicine

## 2021-03-08 ENCOUNTER — Other Ambulatory Visit (HOSPITAL_COMMUNITY): Payer: Self-pay

## 2021-03-08 ENCOUNTER — Encounter (HOSPITAL_COMMUNITY): Payer: Self-pay | Admitting: Internal Medicine

## 2021-03-08 DIAGNOSIS — I10 Essential (primary) hypertension: Secondary | ICD-10-CM | POA: Insufficient documentation

## 2021-03-08 DIAGNOSIS — I25110 Atherosclerotic heart disease of native coronary artery with unstable angina pectoris: Secondary | ICD-10-CM | POA: Diagnosis present

## 2021-03-08 DIAGNOSIS — F419 Anxiety disorder, unspecified: Secondary | ICD-10-CM | POA: Insufficient documentation

## 2021-03-08 DIAGNOSIS — Z79899 Other long term (current) drug therapy: Secondary | ICD-10-CM | POA: Insufficient documentation

## 2021-03-08 DIAGNOSIS — Z7982 Long term (current) use of aspirin: Secondary | ICD-10-CM | POA: Insufficient documentation

## 2021-03-08 DIAGNOSIS — Z8616 Personal history of COVID-19: Secondary | ICD-10-CM | POA: Insufficient documentation

## 2021-03-08 DIAGNOSIS — E782 Mixed hyperlipidemia: Secondary | ICD-10-CM | POA: Diagnosis not present

## 2021-03-08 HISTORY — PX: LEFT HEART CATH AND CORONARY ANGIOGRAPHY: CATH118249

## 2021-03-08 HISTORY — PX: CORONARY PRESSURE/FFR STUDY: CATH118243

## 2021-03-08 LAB — POCT ACTIVATED CLOTTING TIME: Activated Clotting Time: 281 seconds

## 2021-03-08 SURGERY — LEFT HEART CATH AND CORONARY ANGIOGRAPHY
Anesthesia: LOCAL

## 2021-03-08 MED ORDER — ESCITALOPRAM OXALATE 20 MG PO TABS
ORAL_TABLET | ORAL | 1 refills | Status: DC
  Filled 2021-03-08: qty 30, 30d supply, fill #0
  Filled 2021-04-11: qty 30, 30d supply, fill #1

## 2021-03-08 MED ORDER — SODIUM CHLORIDE 0.9 % WEIGHT BASED INFUSION: 1.0000 mL/kg/h | INTRAVENOUS | Status: DC

## 2021-03-08 MED ORDER — FENTANYL CITRATE (PF) 100 MCG/2ML IJ SOLN
INTRAMUSCULAR | Status: AC
  Filled 2021-03-08: qty 2

## 2021-03-08 MED ORDER — ONDANSETRON HCL 4 MG/2ML IJ SOLN: 4.0000 mg | Freq: Four times a day (QID) | INTRAMUSCULAR | Status: DC | PRN

## 2021-03-08 MED ORDER — HEPARIN SODIUM (PORCINE) 1000 UNIT/ML IJ SOLN
INTRAMUSCULAR | Status: DC | PRN
  Administered 2021-03-08 (×2): 5000 [IU] via INTRAVENOUS

## 2021-03-08 MED ORDER — NITROGLYCERIN 1 MG/10 ML FOR IR/CATH LAB
INTRA_ARTERIAL | Status: AC
  Filled 2021-03-08: qty 10

## 2021-03-08 MED ORDER — FENTANYL CITRATE (PF) 100 MCG/2ML IJ SOLN
INTRAMUSCULAR | Status: DC | PRN
  Administered 2021-03-08: 50 ug via INTRAVENOUS

## 2021-03-08 MED ORDER — ACETAMINOPHEN 325 MG PO TABS: 650.0000 mg | ORAL_TABLET | ORAL | Status: DC | PRN

## 2021-03-08 MED ORDER — SODIUM CHLORIDE 0.9 % IV SOLN: 250.0000 mL | INTRAVENOUS | Status: DC | PRN

## 2021-03-08 MED ORDER — VERAPAMIL HCL 2.5 MG/ML IV SOLN
INTRAVENOUS | Status: AC
  Filled 2021-03-08: qty 2

## 2021-03-08 MED ORDER — LABETALOL HCL 5 MG/ML IV SOLN: 10.0000 mg | INTRAVENOUS | Status: DC | PRN

## 2021-03-08 MED ORDER — SODIUM CHLORIDE 0.9 % IV SOLN: INTRAVENOUS | Status: DC

## 2021-03-08 MED ORDER — LIDOCAINE HCL (PF) 1 % IJ SOLN
INTRAMUSCULAR | Status: DC | PRN
  Administered 2021-03-08: 2 mL

## 2021-03-08 MED ORDER — VERAPAMIL HCL 2.5 MG/ML IV SOLN
INTRAVENOUS | Status: DC | PRN
  Administered 2021-03-08: 10 mL via INTRA_ARTERIAL

## 2021-03-08 MED ORDER — HEPARIN (PORCINE) IN NACL 1000-0.9 UT/500ML-% IV SOLN
INTRAVENOUS | Status: DC | PRN
  Administered 2021-03-08 (×2): 500 mL

## 2021-03-08 MED ORDER — SODIUM CHLORIDE 0.9 % WEIGHT BASED INFUSION
3.0000 mL/kg/h | INTRAVENOUS | Status: AC
  Administered 2021-03-08: 3 mL/kg/h via INTRAVENOUS

## 2021-03-08 MED ORDER — SODIUM CHLORIDE 0.9% FLUSH: 3.0000 mL | Freq: Two times a day (BID) | INTRAVENOUS | Status: DC

## 2021-03-08 MED ORDER — SODIUM CHLORIDE 0.9% FLUSH: 3.0000 mL | INTRAVENOUS | Status: DC | PRN

## 2021-03-08 MED ORDER — HEPARIN SODIUM (PORCINE) 1000 UNIT/ML IJ SOLN
INTRAMUSCULAR | Status: AC
  Filled 2021-03-08: qty 10

## 2021-03-08 MED ORDER — IOHEXOL 350 MG/ML SOLN
INTRAVENOUS | Status: DC | PRN
Start: 2021-03-08 — End: 2021-03-08
  Administered 2021-03-08: 100 mL

## 2021-03-08 MED ORDER — ASPIRIN 81 MG PO CHEW: 81.0000 mg | CHEWABLE_TABLET | ORAL | Status: DC

## 2021-03-08 MED ORDER — HYDRALAZINE HCL 20 MG/ML IJ SOLN: 10.0000 mg | INTRAMUSCULAR | Status: DC | PRN

## 2021-03-08 MED ORDER — LIDOCAINE HCL (PF) 1 % IJ SOLN
INTRAMUSCULAR | Status: AC
  Filled 2021-03-08: qty 30

## 2021-03-08 MED ORDER — NITROGLYCERIN 1 MG/10 ML FOR IR/CATH LAB
INTRA_ARTERIAL | Status: DC | PRN
  Administered 2021-03-08: 200 ug via INTRACORONARY

## 2021-03-08 MED ORDER — MIDAZOLAM HCL 2 MG/2ML IJ SOLN
INTRAMUSCULAR | Status: DC | PRN
  Administered 2021-03-08: 1 mg via INTRAVENOUS

## 2021-03-08 MED ORDER — MIDAZOLAM HCL 2 MG/2ML IJ SOLN
INTRAMUSCULAR | Status: AC
  Filled 2021-03-08: qty 2

## 2021-03-08 MED ORDER — HEPARIN (PORCINE) IN NACL 1000-0.9 UT/500ML-% IV SOLN
INTRAVENOUS | Status: AC
  Filled 2021-03-08: qty 1000

## 2021-03-08 SURGICAL SUPPLY — 13 items
CATH INFINITI JR4 5F (CATHETERS) ×1 IMPLANT
CATH LAUNCHER 6FR EBU3.5 (CATHETERS) ×1 IMPLANT
CATH OPTITORQUE TIG 4.0 5F (CATHETERS) ×1 IMPLANT
DEVICE RAD COMP TR BAND LRG (VASCULAR PRODUCTS) ×1 IMPLANT
GLIDESHEATH SLEND SS 6F .021 (SHEATH) ×1 IMPLANT
GUIDEWIRE INQWIRE 1.5J.035X260 (WIRE) IMPLANT
GUIDEWIRE PRESSURE X 175 (WIRE) ×1 IMPLANT
INQWIRE 1.5J .035X260CM (WIRE) ×2
KIT ESSENTIALS PG (KITS) ×1 IMPLANT
KIT HEART LEFT (KITS) ×3 IMPLANT
PACK CARDIAC CATHETERIZATION (CUSTOM PROCEDURE TRAY) ×3 IMPLANT
TRANSDUCER W/STOPCOCK (MISCELLANEOUS) ×3 IMPLANT
TUBING CIL FLEX 10 FLL-RA (TUBING) ×3 IMPLANT

## 2021-03-08 NOTE — Interval H&P Note (Signed)
History and Physical Interval Note:  03/08/2021 7:15 AM  David Conner  has presented today for surgery, with the diagnosis of CAD w/ unstable angina.  The various methods of treatment have been discussed with the patient and family. After consideration of risks, benefits and other options for treatment, the patient has consented to  Procedure(s): LEFT HEART CATH AND CORONARY ANGIOGRAPHY (N/A) as a surgical intervention.  The patient's history has been reviewed, patient examined, no change in status, stable for surgery.  I have reviewed the patient's chart and labs.  Questions were answered to the patient's satisfaction.    Cath Lab Visit (complete for each Cath Lab visit)  Clinical Evaluation Leading to the Procedure:   ACS: No.  Non-ACS:    Anginal Classification: CCS IV  Anti-ischemic medical therapy: Maximal Therapy (2 or more classes of medications)  Non-Invasive Test Results: No non-invasive testing performed  Prior CABG: No previous CABG  Yolande Skoda

## 2021-03-11 ENCOUNTER — Other Ambulatory Visit (HOSPITAL_COMMUNITY): Payer: Self-pay

## 2021-03-12 ENCOUNTER — Other Ambulatory Visit (HOSPITAL_COMMUNITY): Payer: Self-pay

## 2021-03-12 MED ORDER — ALPRAZOLAM 0.5 MG PO TABS
0.5000 mg | ORAL_TABLET | Freq: Three times a day (TID) | ORAL | 5 refills | Status: AC | PRN
  Filled 2021-03-12: qty 90, 30d supply, fill #0
  Filled 2021-04-09: qty 90, 30d supply, fill #1
  Filled 2021-05-13: qty 90, 30d supply, fill #2
  Filled 2021-06-12: qty 90, 30d supply, fill #3
  Filled 2021-07-08: qty 90, 30d supply, fill #4
  Filled 2021-08-16: qty 90, 30d supply, fill #5

## 2021-03-15 ENCOUNTER — Other Ambulatory Visit (HOSPITAL_COMMUNITY): Payer: Self-pay

## 2021-03-15 MED ORDER — ALPRAZOLAM 0.5 MG PO TABS
ORAL_TABLET | ORAL | 0 refills | Status: DC
  Filled 2021-03-15: qty 14, 14d supply, fill #0

## 2021-03-26 ENCOUNTER — Other Ambulatory Visit (HOSPITAL_COMMUNITY): Payer: Self-pay

## 2021-03-30 ENCOUNTER — Other Ambulatory Visit (HOSPITAL_COMMUNITY): Payer: Self-pay

## 2021-03-30 ENCOUNTER — Encounter: Payer: Self-pay | Admitting: Cardiovascular Disease

## 2021-03-30 MED ORDER — ISOSORBIDE MONONITRATE ER 60 MG PO TB24
90.0000 mg | ORAL_TABLET | Freq: Every day | ORAL | 3 refills | Status: DC
  Filled 2021-03-30: qty 135, 90d supply, fill #0

## 2021-04-09 ENCOUNTER — Other Ambulatory Visit (HOSPITAL_COMMUNITY): Payer: Self-pay

## 2021-04-11 ENCOUNTER — Other Ambulatory Visit: Payer: Self-pay | Admitting: Cardiovascular Disease

## 2021-04-12 ENCOUNTER — Telehealth: Payer: Self-pay | Admitting: Cardiovascular Disease

## 2021-04-12 ENCOUNTER — Other Ambulatory Visit (HOSPITAL_COMMUNITY): Payer: Self-pay

## 2021-04-12 DIAGNOSIS — E782 Mixed hyperlipidemia: Secondary | ICD-10-CM

## 2021-04-12 DIAGNOSIS — I2510 Atherosclerotic heart disease of native coronary artery without angina pectoris: Secondary | ICD-10-CM

## 2021-04-12 DIAGNOSIS — I25110 Atherosclerotic heart disease of native coronary artery with unstable angina pectoris: Secondary | ICD-10-CM

## 2021-04-12 MED ORDER — METOPROLOL SUCCINATE ER 100 MG PO TB24
ORAL_TABLET | ORAL | 3 refills | Status: DC
Start: 2021-04-12 — End: 2022-03-21
  Filled 2021-04-12: qty 180, 90d supply, fill #0
  Filled 2021-07-05: qty 180, 90d supply, fill #1
  Filled 2021-09-30 – 2021-10-07 (×2): qty 180, 90d supply, fill #2
  Filled 2022-01-01: qty 180, 90d supply, fill #3

## 2021-04-12 MED ORDER — ESCITALOPRAM OXALATE 20 MG PO TABS
ORAL_TABLET | ORAL | 1 refills | Status: DC
  Filled 2021-04-12: qty 90, 90d supply, fill #0
  Filled 2021-07-25: qty 90, 90d supply, fill #1

## 2021-04-12 NOTE — Telephone Encounter (Signed)
Patient would like lab order put in so he can have them done for his appointment next Monday. ?

## 2021-04-12 NOTE — Telephone Encounter (Signed)
Patient scheduled for lab work tomorrow 3/28. ?

## 2021-04-13 ENCOUNTER — Other Ambulatory Visit: Payer: Self-pay

## 2021-04-13 ENCOUNTER — Other Ambulatory Visit: Payer: Managed Care, Other (non HMO) | Admitting: *Deleted

## 2021-04-13 DIAGNOSIS — I2510 Atherosclerotic heart disease of native coronary artery without angina pectoris: Secondary | ICD-10-CM

## 2021-04-13 DIAGNOSIS — I25110 Atherosclerotic heart disease of native coronary artery with unstable angina pectoris: Secondary | ICD-10-CM

## 2021-04-13 DIAGNOSIS — E782 Mixed hyperlipidemia: Secondary | ICD-10-CM

## 2021-04-13 LAB — BASIC METABOLIC PANEL
BUN/Creatinine Ratio: 8 — ABNORMAL LOW (ref 10–24)
BUN: 8 mg/dL (ref 8–27)
CO2: 27 mmol/L (ref 20–29)
Calcium: 9.6 mg/dL (ref 8.6–10.2)
Chloride: 103 mmol/L (ref 96–106)
Creatinine, Ser: 0.96 mg/dL (ref 0.76–1.27)
Glucose: 103 mg/dL — ABNORMAL HIGH (ref 70–99)
Potassium: 4.7 mmol/L (ref 3.5–5.2)
Sodium: 141 mmol/L (ref 134–144)
eGFR: 90 mL/min/{1.73_m2} (ref >59)

## 2021-04-13 LAB — LIPID PANEL
Chol/HDL Ratio: 2.1 ratio (ref 0.0–5.0)
Cholesterol, Total: 89 mg/dL — ABNORMAL LOW (ref 100–199)
HDL: 43 mg/dL (ref >39)
LDL Chol Calc (NIH): 23 mg/dL (ref 0–99)
Triglycerides: 129 mg/dL (ref 0–149)
VLDL Cholesterol Cal: 23 mg/dL (ref 5–40)

## 2021-04-13 LAB — ALT: ALT: 23 IU/L (ref 0–44)

## 2021-04-18 ENCOUNTER — Encounter: Payer: Self-pay | Admitting: Cardiovascular Disease

## 2021-04-18 NOTE — Progress Notes (Signed)
?Cardiology Office Note:   ? ?Date:  04/19/2021  ? ?ID:  David Conner, DOB 1960-06-15, MRN [id number] ? ?PCP:  Orpah Melter, MD  ?Cardiologist:  Mertie Moores, MD  ?Electrophysiologist:  None  ? ?Referring MD: Orpah Melter, MD  ? ?Chief Complaint  ?Patient presents with  ? Coronary Artery Disease  ?   ?  ? Hyperlipidemia  ? ? ?Nov. 15, 2019  ? ?David Conner is a 61 y.o. male with a recent diagnosis of coronary artery disease.  He was admitted to the hospital in October.  He was found to have a tight LAD stenosis which was stented.  He has a chronic total occlusion of a moderate-sized diagonal vessel.  He also has moderate to severe disease in his distal right coronary artery and obtuse marginal artery. ? ?[name] to have episodes of chest discomfort.  He saw [name], NP week and his  Toprol-XL was  doubled and Imdur 30  was added . ?Also added Xanax  ? ?Has not had any syncope or presyncope ? ?He seems to have fewer chest pains since the increase in meds ? ?Also admits that he has not done as much  ? ?Dec. 9, 2019: ? ?David Conner seen today for follow-up visit.  Wt = 207 lbs.  He was admitted to the hospital with symptoms of unstable angina.  He had PCI of his obtuse marginal arteries.  He still has severe diffuse disease involving his distal  left anterior descending artery. ?His D1 is 100%  occluded.  ? ?His angina is clearly better.   ?Has been taking it easy  ?Still has some chest pressure when he over did it last week  ?Is walking on the treadmill -  No angina when walking on the treadmill ? ?Jan. 16, 2020 ? ?David Conner is seen back today for follow up visit ?He is still consumed by his diagnosis.   Seems to have lots of anxiety. ? ?He has had stenting of his mid LAD Oct. 28, 2019.  The terminal LAD remains very small and severely diseased and is not a good target for revascularization ?Because of his numerous and sometimes conflicting symptoms, we scheduled a GXT ?He walked for 9 minutes of a Bruce  protocol .  Achieved a peak HR of 148 ( 90% predicted max HR ) and had no angina symptoms and no ECG changes.  He stopped due to fagiue and dyspnea.  ?We enrolled him in cardiac rehab but he has not started yet  ? ?  ?He continued to have angina and returned to the cath lab on Nov. 26, 2019 and had stenting of his OM1 and OM2. ? ?Because of his continued symptoms, we started Ranexa which is tolerated very poorly and has stopped  ? ?He is unable to eat and has lost weight. ?We have held is Rosuvastatin to see if this helps with the anorexia.   ? ?He has been evaluated by Greer Pickerel of [facility] surgery and has a CT angio of the abdomen and pelvis scheduled tomorrow  to look for bowel ischemia .  ? ?Has intermittant sharp pain in his chest .   Typically if he does some exercise for a prolonged time - 30 min - 1 hr.  ?Relieved with Xanax ? ?We tried Ranexa but caused him significant GI pain , nausea  ?We have tried higher dose Imdur 60 mg - has not tried to walk on his own at this time  ? ?Takes Xanax  3 times a day .    Has lost 30 lbs  ?Has also been given Certriline but he became very resless and so he went back to the xanax .   Has been referred to a psychiatrist  ? ?His main complaint today is lack of appetite and subsequent fatigue  ? ?March 27, 2019:  ?David Conner is seen today for follow-up of his coronary artery disease.  He is feeling much better than when I saw him last year. ? ?Is not getting much exercise.  Is some mild tightness in his chest with walking .  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  ?Lipid levels are reviewed - levels are great  ? ?March 27, 2020: ?David Conner is seen today for follow up of his CAD ?Feeling fine,  No Cp or dyspena  ?Not exercising as much  ?Work stress  ?Wt is 239 lbs.  ?Admits that he is eating more carbs than he should  ?We reviewed recent labs .  LDL looks great.   Trigs are mildly elevate.  ? ?April 19, 2021 ?David Conner is seen for follow up of his CAD ?CP ?Was having symptoms of CP in Feb,2023 ?Diagnosed with COVID on 2/2/;23/ ? ?Developed CP  ?Cath showed a CTO of his mid/ distal LAD and D1 and 60% oM1 ?Compared to his cath in 2019, the distal LAD 99% stenosis is now a CTO ?The plan was for continued medical therapy  ?Indur was increased to 120 mg a day  ?We reduced the dose back down to 90 several weeks due to diarrhea and nausea ?He reduced further to Imdur 60 mg a day .  ?Is walking about a mile a day  ?Is now back on Lexipro - seems to be doing well on this  ? ?Still has a good bit on anxiety  ?He was having some abdominal pain and went to see his GI doctor.  They decided to do an upper endoscopy but this would require stopping the Brilinta for several days. ? ?I do not think that that is indicated right now.  I suspect that his GI issues are a good bit anxiety and also might be a hold over from his COVID infection 2 months ago.  I would have no problems with an EGD if it did not require stopping the Brilinta.  I think he should concentrate on eating better including more vegetarian meals, more exercise.  And then see if he still has stomach issues 6 to 12 months for now.  He admits that he is already feeling better since he made the appointment for the EGD. ? ?He will call and cancel the EGD. ? ? ?Past Medical History:  ?Diagnosis Date  ? Allergy   ? Complication of anesthesia   ? upper gi done, had anxiety with versed  ? Coronary artery disease   ? 11/13/17:PCI to mid LAD using Resolute Onyx 3.0 x 18 mm DES  ? Depression   ? slight, on sertraline  ? ED (erectile dysfunction)   ? GERD (gastroesophageal reflux disease)   ? Hyperlipidemia   ? Nausea occasional  ? Restless legs syndrome   ? ? ?Past Surgical History:  ?Procedure Laterality Date  ? CARDIAC CATHETERIZATION    ? CHOLECYSTECTOMY N/A 02/18/2013  ? Procedure: LAPAROSCOPIC CHOLECYSTECTOMY WITH INTRAOPERATIVE  CHOLANGIOGRAM, DIAGNOSTIC LAPAROSCOPY, LYSIS OF ADHESIONS ;  Surgeon: [name], MD;  [facility];  Service: General;  Laterality: N/A;  ? colonscopy  2 years ago  ? CORONARY STENT INTERVENTION  11/13/2017  ? CORONARY STENT INTERVENTION N/A 11/13/2017  ? Procedure: CORONARY STENT INTERVENTION;  Surgeon: Nelva Bush, MD;  Location: [place] [facility];  Service: Cardiovascular;  Laterality: N/A;  ? cortisone injection Left 3 weeks ago  ? dr thinks is bursitis  ? esophagus stretching    ? 7-8 times  ? INTRAVASCULAR PRESSURE WIRE/FFR STUDY N/A 03/08/2021  ? Procedure: INTRAVASCULAR PRESSURE WIRE/FFR STUDY;  Surgeon: Nelva Bush, MD;  Location: [place] [facility];  Service: Cardiovascular;  Laterality: N/A;  ? LAPAROSCOPIC APPENDECTOMY N/A 02/18/2013  ? Procedure: APPENDECTOMY LAPAROSCOPIC;  Surgeon: [name], MD;  [facility];  Service: General;  Laterality: N/A;  DR Redmond Pulling SPOKE WITH PT FAMILY DURING SURGERY ABOUT REMOVING PT APPENDIX DUE TO MALROTATION. FAMILY STATED TO PROCEDE WITH REMOVAL OF APPENDIX.   ? LEFT HEART CATH AND CORONARY ANGIOGRAPHY N/A 11/13/2017  ? Procedure: LEFT HEART CATH AND CORONARY ANGIOGRAPHY;  Surgeon: Nelva Bush, MD;  Location: [place] [facility];  Service: Cardiovascular;  Laterality: N/A;  ? LEFT HEART CATH AND CORONARY ANGIOGRAPHY N/A 12/12/2017  ? Procedure: LEFT HEART CATH AND CORONARY ANGIOGRAPHY;  Surgeon: Martinique, Peter M, MD;  Location: [place] [facility];  Service: Cardiovascular;  Laterality: N/A;  ? LEFT HEART CATH AND CORONARY ANGIOGRAPHY N/A 03/08/2021  ? Procedure: LEFT HEART CATH AND CORONARY ANGIOGRAPHY;  Surgeon: Nelva Bush, MD;  Location: [place] [facility];  Service: Cardiovascular;  Laterality: N/A;  ? ? ?Current Medications: ?Current Meds  ?Medication Sig  ? acetaminophen (TYLENOL) 500 MG tablet Take 500 mg by mouth every 6 (six) hours as needed for moderate pain or mild pain.  ? ALPRAZolam (XANAX) 0.5 MG tablet Take 1 tablet (0.5 mg  total) by mouth 3 (three) times daily as needed.  ? aspirin EC 81 MG tablet Take 81 mg by mouth daily.   ? escitalopram (LEXAPRO) 20 MG tablet Take 1 tablet by mouth daily  ? escitalopram (LEXAPRO) 20 MG tablet Take 1

## 2021-04-19 ENCOUNTER — Ambulatory Visit: Payer: Managed Care, Other (non HMO) | Admitting: Cardiovascular Disease

## 2021-04-19 ENCOUNTER — Other Ambulatory Visit (HOSPITAL_COMMUNITY): Payer: Self-pay

## 2021-04-19 ENCOUNTER — Encounter: Payer: Self-pay | Admitting: Cardiovascular Disease

## 2021-04-19 VITALS — BP 122/76 | HR 69 | Ht 70.5 in | Wt 222.0 lb

## 2021-04-19 DIAGNOSIS — I10 Essential (primary) hypertension: Secondary | ICD-10-CM

## 2021-04-19 DIAGNOSIS — Z9861 Coronary angioplasty status: Secondary | ICD-10-CM

## 2021-04-19 DIAGNOSIS — E782 Mixed hyperlipidemia: Secondary | ICD-10-CM

## 2021-04-19 DIAGNOSIS — I2510 Atherosclerotic heart disease of native coronary artery without angina pectoris: Secondary | ICD-10-CM

## 2021-04-19 MED ORDER — ISOSORBIDE MONONITRATE ER 60 MG PO TB24
60.0000 mg | ORAL_TABLET | Freq: Every day | ORAL | 3 refills | Status: DC
  Filled 2021-04-19 – 2021-07-05 (×3): qty 90, 90d supply, fill #0
  Filled 2021-09-26: qty 90, 90d supply, fill #1

## 2021-04-19 MED ORDER — TICAGRELOR 60 MG PO TABS
60.0000 mg | ORAL_TABLET | Freq: Two times a day (BID) | ORAL | 3 refills | Status: DC
  Filled 2021-04-19: qty 90, 45d supply, fill #0
  Filled 2021-04-20: qty 60, 30d supply, fill #0
  Filled 2021-05-14: qty 90, 45d supply, fill #1
  Filled 2021-07-05: qty 90, 45d supply, fill #2
  Filled 2021-08-15: qty 90, 45d supply, fill #3
  Filled 2021-09-26: qty 30, 15d supply, fill #4

## 2021-04-19 NOTE — Patient Instructions (Signed)
Medication Instructions:  ?Your physician has recommended you make the following change in your medication:  ? ?1) DECREASE Brilinta '60mg'$  twice daily ?2) DECREASE Imdur '60mg'$  once daily ? ?*If you need a refill on your cardiac medications before your next appointment, please call your pharmacy* ? ?Lab Work: ?NONE ?If you have labs (blood work) drawn today and your tests are completely normal, you will receive your results only by: ?MyChart Message (if you have MyChart) OR ?A paper copy in the mail ?If you have any lab test that is abnormal or we need to change your treatment, we will call you to review the results. ? ?Testing/Procedures: ?NONE ? ?Follow-Up: ?At [facility], you and your health needs are our priority.  As part of our continuing mission to provide you with exceptional heart care, we have created designated Provider Care Teams.  These Care Teams include your primary Cardiologist (physician) and Advanced Practice Providers (APPs -  Physician Assistants and Nurse Practitioners) who all work together to provide you with the care you need, when you need it. ? ?Your next appointment:   ?6 month(s) ? ?The format for your next appointment:   ?In Person ? ?Provider:   ?Christen Bame, NP  ?

## 2021-04-20 ENCOUNTER — Other Ambulatory Visit (HOSPITAL_COMMUNITY): Payer: Self-pay

## 2021-04-20 MED ORDER — BUSPIRONE HCL 5 MG PO TABS
ORAL_TABLET | Freq: Two times a day (BID) | ORAL | 1 refills | Status: DC
  Filled 2021-04-20: qty 60, 30d supply, fill #0
  Filled 2021-05-13: qty 60, 30d supply, fill #1

## 2021-04-26 ENCOUNTER — Other Ambulatory Visit (HOSPITAL_COMMUNITY): Payer: Self-pay

## 2021-04-27 ENCOUNTER — Other Ambulatory Visit (HOSPITAL_COMMUNITY): Payer: Self-pay

## 2021-05-13 ENCOUNTER — Other Ambulatory Visit (HOSPITAL_COMMUNITY): Payer: Self-pay

## 2021-05-14 ENCOUNTER — Other Ambulatory Visit (HOSPITAL_COMMUNITY): Payer: Self-pay

## 2021-05-17 ENCOUNTER — Other Ambulatory Visit (HOSPITAL_COMMUNITY): Payer: Self-pay

## 2021-05-17 MED ORDER — BUSPIRONE HCL 10 MG PO TABS
ORAL_TABLET | Freq: Two times a day (BID) | ORAL | 1 refills | Status: DC
  Filled 2021-05-17: qty 60, 30d supply, fill #0
  Filled 2021-07-25: qty 60, 30d supply, fill #1

## 2021-06-12 ENCOUNTER — Other Ambulatory Visit (HOSPITAL_COMMUNITY): Payer: Self-pay

## 2021-06-21 ENCOUNTER — Other Ambulatory Visit: Payer: Self-pay | Admitting: Cardiovascular Disease

## 2021-06-21 ENCOUNTER — Other Ambulatory Visit (HOSPITAL_COMMUNITY): Payer: Self-pay

## 2021-06-21 DIAGNOSIS — E782 Mixed hyperlipidemia: Secondary | ICD-10-CM

## 2021-06-21 MED ORDER — BUSPIRONE HCL 10 MG PO TABS
10.0000 mg | ORAL_TABLET | Freq: Three times a day (TID) | ORAL | 1 refills | Status: DC
  Filled 2021-06-21: qty 90, 30d supply, fill #0

## 2021-06-22 ENCOUNTER — Other Ambulatory Visit (HOSPITAL_COMMUNITY): Payer: Self-pay

## 2021-06-22 MED ORDER — REPATHA SURECLICK 140 MG/ML ~~LOC~~ SOAJ
SUBCUTANEOUS | 3 refills | Status: DC
  Filled 2021-06-22: qty 6, 84d supply, fill #0
  Filled 2021-09-09: qty 6, 84d supply, fill #1
  Filled 2021-12-07: qty 6, 84d supply, fill #2
  Filled 2022-03-01 – 2022-03-04 (×2): qty 6, 84d supply, fill #3

## 2021-06-28 ENCOUNTER — Encounter: Payer: Self-pay | Admitting: Cardiovascular Disease

## 2021-06-29 NOTE — Telephone Encounter (Signed)
Will route to MD to make aware of the increased (return to old dosing) dose of Isosrbide. Per CDC website regarding shingles shot:  "CDC recommends that adults 50 years and older get two doses of the shingles vaccine called Shingrix (recombinant zoster vaccine) to prevent shingles and the complications from the disease. Shingrix provides strong protection against shingles and PHN. In adults 38 years and older who have healthy immune systems, Shingrix is more than 90% effective at preventing shingles and PHN. Immunity stays strong for at least the first 7 years after vaccination. In adults with weakened immune systems, studies show that Shingrix is 68%-91% effective in preventing shingles, depending on the condition that affects the immune system. Adults 50 years and older should get two doses of Shingrix, separated by 2 to 6 months."

## 2021-07-05 ENCOUNTER — Other Ambulatory Visit (HOSPITAL_COMMUNITY): Payer: Self-pay

## 2021-07-08 ENCOUNTER — Other Ambulatory Visit (HOSPITAL_COMMUNITY): Payer: Self-pay

## 2021-07-26 ENCOUNTER — Other Ambulatory Visit (HOSPITAL_COMMUNITY): Payer: Self-pay

## 2021-08-04 ENCOUNTER — Other Ambulatory Visit (HOSPITAL_COMMUNITY): Payer: Self-pay

## 2021-08-04 MED ORDER — GABAPENTIN 100 MG PO CAPS
ORAL_CAPSULE | ORAL | 1 refills | Status: DC
  Filled 2021-08-04: qty 60, 30d supply, fill #0
  Filled 2021-08-30: qty 60, 30d supply, fill #1

## 2021-08-04 MED ORDER — ESCITALOPRAM OXALATE 20 MG PO TABS
ORAL_TABLET | ORAL | 1 refills | Status: DC
Start: 2021-08-04 — End: 2021-10-25
  Filled 2021-08-04: qty 90, 90d supply, fill #0

## 2021-08-04 MED ORDER — ALPRAZOLAM 0.5 MG PO TABS
ORAL_TABLET | ORAL | 1 refills | Status: DC
  Filled 2021-08-04: qty 14, 14d supply, fill #0

## 2021-08-07 ENCOUNTER — Other Ambulatory Visit (HOSPITAL_COMMUNITY): Payer: Self-pay

## 2021-08-07 MED ORDER — ZOLPIDEM TARTRATE 10 MG PO TABS
10.0000 mg | ORAL_TABLET | Freq: Every evening | ORAL | 0 refills | Status: DC | PRN
  Filled 2021-08-09: qty 90, 90d supply, fill #0

## 2021-08-09 ENCOUNTER — Other Ambulatory Visit (HOSPITAL_COMMUNITY): Payer: Self-pay

## 2021-08-16 ENCOUNTER — Other Ambulatory Visit (HOSPITAL_COMMUNITY): Payer: Self-pay

## 2021-08-30 ENCOUNTER — Other Ambulatory Visit (HOSPITAL_COMMUNITY): Payer: Self-pay

## 2021-09-06 ENCOUNTER — Other Ambulatory Visit (HOSPITAL_COMMUNITY): Payer: Self-pay

## 2021-09-06 MED ORDER — GABAPENTIN 100 MG PO CAPS
100.0000 mg | ORAL_CAPSULE | Freq: Three times a day (TID) | ORAL | 1 refills | Status: DC
  Filled 2021-09-06: qty 270, 90d supply, fill #0
  Filled 2021-09-30: qty 270, 90d supply, fill #1

## 2021-09-09 ENCOUNTER — Other Ambulatory Visit (HOSPITAL_COMMUNITY): Payer: Self-pay

## 2021-09-10 ENCOUNTER — Other Ambulatory Visit (HOSPITAL_COMMUNITY): Payer: Self-pay

## 2021-09-27 ENCOUNTER — Other Ambulatory Visit (HOSPITAL_COMMUNITY): Payer: Self-pay

## 2021-09-30 ENCOUNTER — Other Ambulatory Visit (HOSPITAL_COMMUNITY): Payer: Self-pay

## 2021-10-02 ENCOUNTER — Other Ambulatory Visit (HOSPITAL_COMMUNITY): Payer: Self-pay

## 2021-10-08 ENCOUNTER — Other Ambulatory Visit (HOSPITAL_COMMUNITY): Payer: Self-pay

## 2021-10-09 ENCOUNTER — Other Ambulatory Visit: Payer: Self-pay | Admitting: Cardiovascular Disease

## 2021-10-11 ENCOUNTER — Other Ambulatory Visit (HOSPITAL_COMMUNITY): Payer: Self-pay

## 2021-10-11 MED ORDER — TICAGRELOR 60 MG PO TABS
60.0000 mg | ORAL_TABLET | Freq: Two times a day (BID) | ORAL | 1 refills | Status: DC
  Filled 2021-10-11: qty 180, 90d supply, fill #0
  Filled 2022-01-01: qty 180, 90d supply, fill #1

## 2021-10-12 ENCOUNTER — Other Ambulatory Visit (HOSPITAL_COMMUNITY): Payer: Self-pay

## 2021-10-24 ENCOUNTER — Encounter: Payer: Self-pay | Admitting: Cardiovascular Disease

## 2021-10-24 NOTE — Progress Notes (Unsigned)
Cardiology Office Note:    Date:  10/25/2021   ID:  David Conner, DOB 26-Aug-1960, MRN [id number]  PCP:  Orpah Melter, MD  Cardiologist:  Mertie Moores, MD  Electrophysiologist:  None   Referring MD: Orpah Melter, MD   Chief Complaint  Patient presents with   Coronary Artery Disease         Nov. 15, 2019   David Conner is a 61 y.o. male with a recent diagnosis of coronary artery disease.  He was admitted to the hospital in October.  He was found to have a tight LAD stenosis which was stented.  He has a chronic total occlusion of a moderate-sized diagonal vessel.  He also has moderate to severe disease in his distal right coronary artery and obtuse marginal artery.  [name] to have episodes of chest discomfort.  He saw [name], NP week and his  Toprol-XL was  doubled and Imdur 30  was added . Also added Xanax   Has not had any syncope or presyncope  He seems to have fewer chest pains since the increase in meds  Also admits that he has not done as much   Dec. 9, 2019:  David Conner seen today for follow-up visit.  Wt = 207 lbs.  He was admitted to the hospital with symptoms of unstable angina.  He had PCI of his obtuse marginal arteries.  He still has severe diffuse disease involving his distal  left anterior descending artery. His D1 is 100%  occluded.   His angina is clearly better.   Has been taking it easy  Still has some chest pressure when he over did it last week  Is walking on the treadmill -  No angina when walking on the treadmill  Jan. 16, 2020  David Conner is seen back today for follow up visit He is still consumed by his diagnosis.   Seems to have lots of anxiety.  He has had stenting of his mid LAD Oct. 28, 2019.  The terminal LAD remains very small and severely diseased and is not a good target for revascularization Because of his numerous and sometimes conflicting symptoms, we scheduled a GXT He walked for 9 minutes of a Bruce protocol .  Achieved  a peak HR of 148 ( 90% predicted max HR ) and had no angina symptoms and no ECG changes.  He stopped due to fagiue and dyspnea.  We enrolled him in cardiac rehab but he has not started yet     He continued to have angina and returned to the cath lab on Nov. 26, 2019 and had stenting of his OM1 and OM2.  Because of his continued symptoms, we started Ranexa which is tolerated very poorly and has stopped   He is unable to eat and has lost weight. We have held is Rosuvastatin to see if this helps with the anorexia.    He has been evaluated by Greer Pickerel of [facility] surgery and has a CT angio of the abdomen and pelvis scheduled tomorrow  to look for bowel ischemia .   Has intermittant sharp pain in his chest .   Typically if he does some exercise for a prolonged time - 30 min - 1 hr.  Relieved with Xanax  We tried Ranexa but caused him significant GI pain , nausea  We have tried higher dose Imdur 60 mg - has not tried to walk on his own at this time   Takes Xanax 3 times a  day .    Has lost 30 lbs  Has also been given Certriline but he became very resless and so he went back to the xanax .   Has been referred to a psychiatrist   His main complaint today is lack of appetite and subsequent fatigue   March 27, 2019:  David Conner is seen today for follow-up of his coronary artery disease.  He is feeling much better than when I saw him last year.  Is not getting much exercise.  Is some mild tightness in his chest with walking .  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  Lipid levels are reviewed - levels are great   March 27, 2020: David Conner is seen today for follow up of his CAD Feeling fine,  No Cp or dyspena  Not exercising as much  Work stress  Wt is 239 lbs.  Admits that he is eating more carbs than he should  We reviewed recent labs .  LDL looks great.  Trigs are mildly  elevate.   April 19, 2021 David Conner is seen for follow up of his CAD CP Was having symptoms of CP in Feb,2023 Diagnosed with COVID on 2/2/;23/  Developed CP  Cath showed a CTO of his mid/ distal LAD and D1 and 60% oM1 Compared to his cath in 2019, the distal LAD 99% stenosis is now a CTO The plan was for continued medical therapy  Indur was increased to 120 mg a day  We reduced the dose back down to 90 several weeks due to diarrhea and nausea He reduced further to Imdur 60 mg a day .  Is walking about a mile a day  Is now back on Lexipro - seems to be doing well on this   Still has a good bit on anxiety  He was having some abdominal pain and went to see his GI doctor.  They decided to do an upper endoscopy but this would require stopping the Brilinta for several days.  I do not think that that is indicated right now.  I suspect that his GI issues are a good bit anxiety and also might be a hold over from his COVID infection 2 months ago.  I would have no problems with an EGD if it did not require stopping the Brilinta.  I think he should concentrate on eating better including more vegetarian meals, more exercise.  And then see if he still has stomach issues 6 to 12 months for now.  He admits that he is already feeling better since he made the appointment for the EGD.  He will call and cancel the EGD.  Oct. 9, 2023 David Conner is seen for follow up of his CAD Has gained some weight  No CP. Still exercising .  Not as much as he was years ago.    Having some issues with anxiety.  Is trying several anti-anxiety meds.  He may need to have a colonoscopy in the near future. He has not had any significant episodes of angina.  He is at low risk for a colonoscopy. He may hold the aspirin and Brilinta for between 5 and 7 days prior to the colonoscopy.  He should restart the medicines as soon as it is deemed safe from a GI standpont   Past Medical History:  Diagnosis Date   Allergy     Complication of anesthesia  upper gi done, had anxiety with versed   Coronary artery disease    11/13/17:PCI to mid LAD using Resolute Onyx 3.0 x 18 mm DES   Depression    slight, on sertraline   ED (erectile dysfunction)    GERD (gastroesophageal reflux disease)    Hyperlipidemia    Nausea occasional   Restless legs syndrome     Past Surgical History:  Procedure Laterality Date   CARDIAC CATHETERIZATION     CHOLECYSTECTOMY N/A 02/18/2013   Procedure: LAPAROSCOPIC CHOLECYSTECTOMY WITH INTRAOPERATIVE CHOLANGIOGRAM, DIAGNOSTIC LAPAROSCOPY, LYSIS OF ADHESIONS ;  Surgeon: [name], MD;  Location: [facility];  Service: General;  Laterality: N/A;   colonscopy  2 years ago   CORONARY STENT INTERVENTION  11/13/2017   CORONARY STENT INTERVENTION N/A 11/13/2017   Procedure: CORONARY STENT INTERVENTION;  Surgeon: Nelva Bush, MD;  Location: [place] [facility];  Service: Cardiovascular;  Laterality: N/A;   cortisone injection Left 3 weeks ago   dr thinks is bursitis   esophagus stretching     7-8 times   INTRAVASCULAR PRESSURE WIRE/FFR STUDY N/A 03/08/2021   Procedure: INTRAVASCULAR PRESSURE WIRE/FFR STUDY;  Surgeon: Nelva Bush, MD;  Location: [place] [facility];  Service: Cardiovascular;  Laterality: N/A;   LAPAROSCOPIC APPENDECTOMY N/A 02/18/2013   Procedure: APPENDECTOMY LAPAROSCOPIC;  Surgeon: [name], MD;  Location: [facility];  Service: General;  Laterality: N/A;  DR Redmond Pulling SPOKE WITH PT FAMILY DURING SURGERY ABOUT REMOVING PT APPENDIX DUE TO MALROTATION. FAMILY STATED TO PROCEDE WITH REMOVAL OF APPENDIX.    LEFT HEART CATH AND CORONARY ANGIOGRAPHY N/A 11/13/2017   Procedure: LEFT HEART CATH AND CORONARY ANGIOGRAPHY;  Surgeon: Nelva Bush, MD;  Location: [place] [facility];  Service: Cardiovascular;  Laterality: N/A;   LEFT HEART CATH AND CORONARY ANGIOGRAPHY N/A 12/12/2017   Procedure: LEFT HEART CATH AND CORONARY ANGIOGRAPHY;  Surgeon: Martinique, Peter M, MD;  Location: [place] [facility];  Service: Cardiovascular;  Laterality: N/A;   LEFT HEART CATH AND CORONARY ANGIOGRAPHY N/A 03/08/2021   Procedure: LEFT HEART CATH AND CORONARY ANGIOGRAPHY;  Surgeon: Nelva Bush, MD;  Location: [place] [facility];  Service: Cardiovascular;  Laterality: N/A;    Current Medications: Current Meds  Medication Sig   acetaminophen (TYLENOL) 500 MG tablet Take 500 mg by mouth every 6 (six) hours as needed for moderate pain or mild pain.   ALPRAZolam (XANAX) 0.5 MG tablet Take 1 tablet (0.5 mg total) by mouth 3 (three) times daily as needed.   aspirin EC 81 MG tablet Take 81 mg by mouth daily.    escitalopram (LEXAPRO) 20 MG tablet Take 1 tablet by mouth daily   esomeprazole (NEXIUM) 40 MG capsule Take one capsule by mouth daily.   Evolocumab (REPATHA SURECLICK) 315 MG/ML SOAJ INJECT 1 PEN INTO THE SKIN EVERY 14 DAYS   gabapentin (NEURONTIN) 100 MG capsule Take 1 capsule by mouth 3 times daily.   isosorbide mononitrate (IMDUR) 60 MG 24 hr tablet Take 1 tablet (60 mg total) by mouth daily.   metoprolol succinate (TOPROL-XL) 100 MG 24 hr tablet TAKE 1 TABLET BY MOUTH TWO TIMES DAILY WITH OR IMMEDIATELY FOLLOWING A MEAL   nitroGLYCERIN (NITROSTAT) 0.3 MG SL tablet Place 1 tablet under the tongue to dissolve every 5 minutes as needed for chest pain.   ticagrelor (BRILINTA) 60 MG TABS tablet Take 1 tablet (60 mg total) by mouth 2 (two) times daily.   zolpidem (AMBIEN) 10 MG tablet Take one tablet  by mouth as needed at bedtime.   [DISCONTINUED] escitalopram (LEXAPRO) 20 MG tablet Take 1 tablet by mouth daily     Allergies:   Penicillin g   Social History   Socioeconomic History   Marital status: Married    Spouse name: Not on file   Number of children: Not on file   Years of education: Not on file   Highest education level: Master's degree (e.g., MA, MS, MEng, MEd, MSW, MBA)  Occupational History   Not on file  Tobacco Use   Smoking status: Never   Smokeless tobacco:  Never  Vaping Use   Vaping Use: Never used  Substance and Sexual Activity   Alcohol use: No   Drug use: No   Sexual activity: Yes    Birth control/protection: Sponge  Other Topics Concern   Not on file  Social History Narrative   Not on file   Social Determinants of Health   Financial Resource Strain: Low Risk  (02/06/2018)   Overall Financial Resource Strain (CARDIA)    Difficulty of Paying Living Expenses: Not very hard  Food Insecurity: No Food Insecurity (02/06/2018)   Hunger Vital Sign    Worried About Running Out of Food in the Last Year: Never true    Ran Out of Food in the Last Year: Never true  Transportation Needs: No Transportation Needs (02/06/2018)   PRAPARE - Hydrologist (Medical): No    Lack of Transportation (Non-Medical): No  Physical Activity: Inactive (02/06/2018)   Exercise Vital Sign    Days of Exercise per Week: 0 days    Minutes of Exercise per Session: 0 min  Stress: Stress Concern Present (02/06/2018)   [place]    Feeling of Stress : Very much  Social Connections: Not on file     Family History: The patient's family history includes Bladder Cancer in his father; CAD in his maternal grandfather and mother; Diabetes in his father; Healthy in his sister; Hyperlipidemia in his mother; Hypertension in his father and mother.  ROS:   Listed in HPI, otherwise he is doing well ROS is negative     EKGs/Labs/Other Studies Reviewed:       Recent Labs: 02/25/2021: Hemoglobin 14.2; Platelets 257 04/13/2021: ALT 23; BUN 8; Creatinine, Ser 0.96; Potassium 4.7; Sodium 141  Recent Lipid Panel    Component Value Date/Time   CHOL 89 (L) 04/13/2021 0859   TRIG 129 04/13/2021 0859   HDL 43 04/13/2021 0859   CHOLHDL 2.1 04/13/2021 0859   CHOLHDL 4.7 11/11/2017 2043   VLDL 8 11/11/2017 2043   LDLCALC 23 04/13/2021 0859    Physical Exam:     Physical Exam: Blood  pressure 118/70, pulse 65, height 5' 10.5" (1.791 m), weight 242 lb (109.8 kg), SpO2 96 %.       GEN:  Well nourished, well developed in no acute distress HEENT: Normal NECK: No JVD; No carotid bruits LYMPHATICS: No lymphadenopathy CARDIAC: RRR , no murmurs, rubs, gallops RESPIRATORY:  Clear to auscultation without rales, wheezing or rhonchi  ABDOMEN: Soft, non-tender, non-distended MUSCULOSKELETAL:  No edema; No deformity  SKIN: Warm and dry NEUROLOGIC:  Alert and oriented x 3    EKG:       ASSESSMENT:    No diagnosis found.  PLAN:     1.  Coronary artery disease: Overall he is doing well.  We decreased his isosorbide to 60 mg at his last  visit.  He actually feels better on the 90 mg.  Continue scription.  . 2.  Weight gain:    Continues to have some weight gain.  I encouraged him to avoid carbohydrates.  He is to avoid foods that are white, wheat, sweet.  2.  Hyperlipidemia:   Lipid levels looked good.  We will recheck lipids, ALT, basic metabolic profile today.     Medication Adjustments/Labs and Tests Ordered: Current medicines are reviewed at length with the patient today.  Concerns regarding medicines are outlined above.  No orders of the defined types were placed in this encounter.  No orders of the defined types were placed in this encounter.   There are no Patient Instructions on file for this visit.

## 2021-10-25 ENCOUNTER — Ambulatory Visit: Payer: Managed Care, Other (non HMO) | Attending: Cardiovascular Disease | Admitting: Cardiovascular Disease

## 2021-10-25 ENCOUNTER — Telehealth: Payer: Self-pay | Admitting: Cardiovascular Disease

## 2021-10-25 ENCOUNTER — Encounter: Payer: Self-pay | Admitting: Cardiovascular Disease

## 2021-10-25 ENCOUNTER — Other Ambulatory Visit (HOSPITAL_COMMUNITY): Payer: Self-pay

## 2021-10-25 VITALS — BP 118/70 | HR 65 | Ht 70.5 in | Wt 242.0 lb

## 2021-10-25 DIAGNOSIS — I10 Essential (primary) hypertension: Secondary | ICD-10-CM | POA: Diagnosis not present

## 2021-10-25 DIAGNOSIS — E782 Mixed hyperlipidemia: Secondary | ICD-10-CM | POA: Diagnosis not present

## 2021-10-25 DIAGNOSIS — I25110 Atherosclerotic heart disease of native coronary artery with unstable angina pectoris: Secondary | ICD-10-CM | POA: Diagnosis not present

## 2021-10-25 DIAGNOSIS — I2510 Atherosclerotic heart disease of native coronary artery without angina pectoris: Secondary | ICD-10-CM | POA: Diagnosis not present

## 2021-10-25 DIAGNOSIS — Z9861 Coronary angioplasty status: Secondary | ICD-10-CM

## 2021-10-25 MED ORDER — ISOSORBIDE MONONITRATE ER 60 MG PO TB24
90.0000 mg | ORAL_TABLET | Freq: Every day | ORAL | 3 refills | Status: DC
  Filled 2021-10-25 – 2021-11-02 (×3): qty 135, 90d supply, fill #0
  Filled 2022-01-24 – 2022-02-15 (×2): qty 135, 90d supply, fill #1
  Filled 2022-05-12: qty 45, 30d supply, fill #2
  Filled 2022-06-13: qty 45, 30d supply, fill #3
  Filled 2022-07-13: qty 45, 30d supply, fill #4
  Filled 2022-08-04 – 2022-08-05 (×2): qty 45, 30d supply, fill #5
  Filled 2022-09-23: qty 45, 30d supply, fill #6

## 2021-10-25 NOTE — Telephone Encounter (Signed)
Patient calling to see if he needs labs done before his appt. Please advise

## 2021-10-25 NOTE — Patient Instructions (Addendum)
Medication Instructions:  INCREASE ISOSORBIDE TO 90 MG EVERY DAY  *If you need a refill on your cardiac medications before your next appointment, please call your pharmacy*   Lab Work:R=TODAY  BMET ALT AND LIPID If you have labs (blood work) drawn today and your tests are completely normal, you will receive your results only by: [place] (if you have MyChart) OR A paper copy in the mail If you have any lab test that is abnormal or we need to change your treatment, we will call you to review the results.   Testing/Procedures: NONE   Follow-Up: At [facility], you and your health needs are our priority.  As part of our continuing mission to provide you with exceptional heart care, we have created designated Provider Care Teams.  These Care Teams include your primary Cardiologist (physician) and Advanced Practice Providers (APPs -  Physician Assistants and Nurse Practitioners) who all work together to provide you with the care you need, when you need it.  We recommend signing up for the patient portal called "MyChart".  Sign up information is provided on this After Visit Summary.  MyChart is used to connect with patients for Virtual Visits (Telemedicine).  Patients are able to view lab/test results, encounter notes, upcoming appointments, etc.  Non-urgent messages can be sent to your provider as well.   To learn more about what you can do with MyChart, go to [email or web address].    Your next appointment:   1 YEAR   The format for your next appointment:   In Person  Provider:   Mertie Moores, MD     Other Instructions NONE  Important Information About Sugar

## 2021-10-25 NOTE — Telephone Encounter (Signed)
Patient called to inquire if he needed to come in early for lab work. Advised since appointment was today, to come at regular appointment time and discuss needed lab work with Dr. Acie Fredrickson at that time. No further questions.

## 2021-10-26 LAB — BASIC METABOLIC PANEL
BUN/Creatinine Ratio: 11 (ref 10–24)
BUN: 10 mg/dL (ref 8–27)
CO2: 19 mmol/L — ABNORMAL LOW (ref 20–29)
Calcium: 9.6 mg/dL (ref 8.6–10.2)
Chloride: 104 mmol/L (ref 96–106)
Creatinine, Ser: 0.95 mg/dL (ref 0.76–1.27)
Glucose: 91 mg/dL (ref 70–99)
Potassium: 4.5 mmol/L (ref 3.5–5.2)
Sodium: 141 mmol/L (ref 134–144)
eGFR: 91 mL/min/{1.73_m2} (ref >59)

## 2021-10-26 LAB — LIPID PANEL
Chol/HDL Ratio: 2.9 ratio (ref 0.0–5.0)
Cholesterol, Total: 97 mg/dL — ABNORMAL LOW (ref 100–199)
HDL: 33 mg/dL — ABNORMAL LOW (ref >39)
LDL Chol Calc (NIH): 41 mg/dL (ref 0–99)
Triglycerides: 128 mg/dL (ref 0–149)
VLDL Cholesterol Cal: 23 mg/dL (ref 5–40)

## 2021-10-26 LAB — ALT: ALT: 43 IU/L (ref 0–44)

## 2021-11-01 ENCOUNTER — Other Ambulatory Visit (HOSPITAL_COMMUNITY): Payer: Self-pay

## 2021-11-01 MED ORDER — ESCITALOPRAM OXALATE 20 MG PO TABS
20.0000 mg | ORAL_TABLET | Freq: Every day | ORAL | 1 refills | Status: DC
  Filled 2021-11-01: qty 90, 90d supply, fill #0
  Filled 2022-01-24: qty 90, 90d supply, fill #1

## 2021-11-03 ENCOUNTER — Other Ambulatory Visit (HOSPITAL_COMMUNITY): Payer: Self-pay

## 2021-11-12 ENCOUNTER — Other Ambulatory Visit (HOSPITAL_COMMUNITY): Payer: Self-pay

## 2021-11-12 MED ORDER — ESOMEPRAZOLE MAGNESIUM 40 MG PO CPDR
40.0000 mg | DELAYED_RELEASE_CAPSULE | Freq: Every day | ORAL | 3 refills | Status: AC
  Filled 2021-11-12: qty 90, 90d supply, fill #0
  Filled 2022-02-15: qty 90, 90d supply, fill #1
  Filled 2022-05-12: qty 30, 30d supply, fill #2
  Filled 2022-06-13: qty 30, 30d supply, fill #3
  Filled 2022-07-13: qty 30, 30d supply, fill #4
  Filled 2022-08-04 – 2022-08-12 (×2): qty 90, 90d supply, fill #5

## 2021-11-12 MED ORDER — ZOLPIDEM TARTRATE 10 MG PO TABS
10.0000 mg | ORAL_TABLET | Freq: Every evening | ORAL | 3 refills | Status: DC | PRN
  Filled 2021-11-12: qty 90, 90d supply, fill #0
  Filled 2022-02-15: qty 90, 90d supply, fill #1
  Filled 2022-04-25 – 2022-04-26 (×2): qty 90, 90d supply, fill #2

## 2021-12-07 ENCOUNTER — Other Ambulatory Visit (HOSPITAL_COMMUNITY): Payer: Self-pay

## 2021-12-07 MED ORDER — GABAPENTIN 100 MG PO CAPS
100.0000 mg | ORAL_CAPSULE | Freq: Three times a day (TID) | ORAL | 0 refills | Status: DC
  Filled 2021-12-07: qty 270, 90d supply, fill #0

## 2022-01-19 ENCOUNTER — Other Ambulatory Visit (HOSPITAL_COMMUNITY): Payer: Self-pay

## 2022-01-20 ENCOUNTER — Other Ambulatory Visit (HOSPITAL_COMMUNITY): Payer: Self-pay

## 2022-01-24 ENCOUNTER — Other Ambulatory Visit (HOSPITAL_COMMUNITY): Payer: Self-pay

## 2022-01-28 ENCOUNTER — Other Ambulatory Visit (HOSPITAL_COMMUNITY): Payer: Self-pay

## 2022-02-15 ENCOUNTER — Other Ambulatory Visit (HOSPITAL_COMMUNITY): Payer: Self-pay

## 2022-02-16 ENCOUNTER — Other Ambulatory Visit (HOSPITAL_COMMUNITY): Payer: Self-pay

## 2022-02-16 ENCOUNTER — Other Ambulatory Visit: Payer: Self-pay

## 2022-03-01 ENCOUNTER — Other Ambulatory Visit (HOSPITAL_COMMUNITY): Payer: Self-pay

## 2022-03-02 ENCOUNTER — Other Ambulatory Visit (HOSPITAL_COMMUNITY): Payer: Self-pay

## 2022-03-03 ENCOUNTER — Other Ambulatory Visit (HOSPITAL_COMMUNITY): Payer: Self-pay

## 2022-03-04 ENCOUNTER — Other Ambulatory Visit (HOSPITAL_COMMUNITY): Payer: Self-pay

## 2022-03-07 ENCOUNTER — Other Ambulatory Visit (HOSPITAL_COMMUNITY): Payer: Self-pay

## 2022-03-08 ENCOUNTER — Other Ambulatory Visit (HOSPITAL_COMMUNITY): Payer: Self-pay

## 2022-03-08 ENCOUNTER — Telehealth: Payer: Self-pay

## 2022-03-08 DIAGNOSIS — E782 Mixed hyperlipidemia: Secondary | ICD-10-CM

## 2022-03-08 MED ORDER — REPATHA SURECLICK 140 MG/ML ~~LOC~~ SOAJ
140.0000 mg | SUBCUTANEOUS | 3 refills | Status: DC
  Filled 2022-03-08: qty 2, 28d supply, fill #0
  Filled 2022-04-05: qty 2, 28d supply, fill #1
  Filled 2022-04-25: qty 2, 28d supply, fill #2
  Filled 2022-05-23: qty 2, 28d supply, fill #3
  Filled 2022-06-20: qty 2, 28d supply, fill #4
  Filled 2022-07-18: qty 2, 28d supply, fill #5
  Filled 2022-08-24: qty 2, 28d supply, fill #6
  Filled 2022-09-23: qty 2, 28d supply, fill #7

## 2022-03-08 NOTE — Telephone Encounter (Signed)
Pharmacy Patient Advocate Encounter  Prior Authorization for REPATHA 140 MG/ML INJ  has been approved.    Effective dates: 03/08/22 through 03/09/23   Received notification from [facility] that prior authorization for REPATHA 140 MG/ML INJ is needed.    PA submitted on 03/08/22 Key [id number] Status is pending  Karie Soda, [place] Patient Advocate Specialist Direct Number: [phone] Fax: [phone]

## 2022-03-08 NOTE — Addendum Note (Signed)
Addended by: Ezra Denne E on: 03/08/2022 02:31 PM   Modules accepted: Orders

## 2022-03-08 NOTE — Telephone Encounter (Signed)
They need to see his labs from before he started Repatha. The labs attached are all from when he took [place] and don't show his baseline LDL before he started on Repatha, so they aren't viewing it as him having a reduction in LDL. They will need to see his 2019 LDL of 142 which shows his baseline before he started on Repatha. The labs attached in 2022 and 2023 of 42, 23, and 41 are all representative of him on Repatha therapy.

## 2022-03-08 NOTE — Telephone Encounter (Signed)
Pharmacy Patient Advocate Encounter  Received notification from [facility] that the request for prior authorization for REPATHA 140 MG/ML INJ has been denied due to NOT SEEING A MAINTAINED LDL REDUCTION.      Please advise   Karie Soda, [place] Patient Advocate Specialist Direct Number: [phone] Fax: [phone]

## 2022-03-08 NOTE — Telephone Encounter (Signed)
Thank you Megan, I resubmitted attaching the 2019 LDL.

## 2022-03-08 NOTE — Telephone Encounter (Signed)
Please see KEY: [id number] and review submitted chart documentation which included most recent LDL panel.

## 2022-03-08 NOTE — Telephone Encounter (Signed)
He has been on Repatha since 2020. He has been maintaining a reduction in LDL in the 20-40 range. His LDL before starting Repatha was 142 in 2019.

## 2022-03-09 ENCOUNTER — Other Ambulatory Visit (HOSPITAL_COMMUNITY): Payer: Self-pay

## 2022-03-10 ENCOUNTER — Other Ambulatory Visit (HOSPITAL_COMMUNITY): Payer: Self-pay

## 2022-03-11 ENCOUNTER — Other Ambulatory Visit (HOSPITAL_COMMUNITY): Payer: Self-pay

## 2022-03-14 ENCOUNTER — Other Ambulatory Visit (HOSPITAL_COMMUNITY): Payer: Self-pay

## 2022-03-21 ENCOUNTER — Other Ambulatory Visit: Payer: Self-pay | Admitting: Cardiovascular Disease

## 2022-03-21 MED ORDER — TICAGRELOR 60 MG PO TABS
60.0000 mg | ORAL_TABLET | Freq: Two times a day (BID) | ORAL | 2 refills | Status: DC
  Filled 2022-03-21: qty 60, 30d supply, fill #0
  Filled 2022-05-08: qty 60, 30d supply, fill #1
  Filled 2022-06-08: qty 60, 30d supply, fill #2
  Filled 2022-06-26 – 2022-07-06 (×2): qty 60, 30d supply, fill #3
  Filled 2022-08-04: qty 60, 30d supply, fill #4
  Filled 2022-09-10: qty 60, 30d supply, fill #5

## 2022-03-21 MED ORDER — METOPROLOL SUCCINATE ER 100 MG PO TB24
ORAL_TABLET | ORAL | 2 refills | Status: DC
  Filled 2022-03-21: qty 60, 30d supply, fill #0
  Filled 2022-05-08: qty 60, 30d supply, fill #1
  Filled 2022-06-08: qty 60, 30d supply, fill #2
  Filled 2022-06-26 – 2022-07-07 (×3): qty 60, 30d supply, fill #3
  Filled 2022-07-07 (×2): qty 180, 90d supply, fill #3
  Filled 2022-10-03: qty 180, 90d supply, fill #4

## 2022-03-22 ENCOUNTER — Other Ambulatory Visit (HOSPITAL_COMMUNITY): Payer: Self-pay

## 2022-03-26 ENCOUNTER — Other Ambulatory Visit (HOSPITAL_COMMUNITY): Payer: Self-pay

## 2022-04-05 ENCOUNTER — Other Ambulatory Visit (HOSPITAL_COMMUNITY): Payer: Self-pay

## 2022-04-22 ENCOUNTER — Other Ambulatory Visit (HOSPITAL_COMMUNITY): Payer: Self-pay

## 2022-04-22 MED ORDER — ESCITALOPRAM OXALATE 20 MG PO TABS
20.0000 mg | ORAL_TABLET | Freq: Every day | ORAL | 0 refills | Status: DC
  Filled 2022-04-22: qty 30, 30d supply, fill #0
  Filled 2022-05-27: qty 30, 30d supply, fill #1
  Filled 2022-06-26: qty 30, 30d supply, fill #2

## 2022-04-25 ENCOUNTER — Other Ambulatory Visit: Payer: Self-pay

## 2022-04-25 ENCOUNTER — Other Ambulatory Visit (HOSPITAL_COMMUNITY): Payer: Self-pay

## 2022-04-26 ENCOUNTER — Other Ambulatory Visit: Payer: Self-pay

## 2022-04-26 ENCOUNTER — Other Ambulatory Visit (HOSPITAL_COMMUNITY): Payer: Self-pay

## 2022-04-27 ENCOUNTER — Other Ambulatory Visit (HOSPITAL_COMMUNITY): Payer: Self-pay

## 2022-05-03 ENCOUNTER — Other Ambulatory Visit (HOSPITAL_COMMUNITY): Payer: Self-pay

## 2022-05-03 ENCOUNTER — Other Ambulatory Visit: Payer: Self-pay | Admitting: *Deleted

## 2022-05-03 MED ORDER — NITROGLYCERIN 0.3 MG SL SUBL: 0.3000 mg | SUBLINGUAL_TABLET | SUBLINGUAL | 0 refills | Status: DC | PRN

## 2022-05-03 NOTE — Telephone Encounter (Signed)
Patient left a msg on the refill voicemail requesting a refill of nitro be sent to pharmacy in [place] as he is there on a business trip. Rx has been sent, confirmation received.

## 2022-05-12 ENCOUNTER — Other Ambulatory Visit: Payer: Self-pay

## 2022-05-16 ENCOUNTER — Other Ambulatory Visit (HOSPITAL_COMMUNITY): Payer: Self-pay

## 2022-05-16 MED ORDER — ZOLPIDEM TARTRATE 10 MG PO TABS
ORAL_TABLET | ORAL | 1 refills | Status: DC
  Filled 2022-05-16: qty 90, 90d supply, fill #0
  Filled 2022-06-26 – 2022-07-24 (×2): qty 90, 90d supply, fill #1
  Filled 2022-08-04: qty 15, 15d supply, fill #1
  Filled 2022-08-12: qty 90, 90d supply, fill #1

## 2022-06-08 ENCOUNTER — Other Ambulatory Visit (HOSPITAL_COMMUNITY): Payer: Self-pay

## 2022-06-10 ENCOUNTER — Other Ambulatory Visit (HOSPITAL_COMMUNITY): Payer: Self-pay

## 2022-06-17 ENCOUNTER — Other Ambulatory Visit (HOSPITAL_COMMUNITY): Payer: Self-pay

## 2022-06-23 ENCOUNTER — Other Ambulatory Visit (HOSPITAL_COMMUNITY): Payer: Self-pay

## 2022-06-27 ENCOUNTER — Other Ambulatory Visit: Payer: Self-pay

## 2022-07-01 ENCOUNTER — Other Ambulatory Visit (HOSPITAL_COMMUNITY): Payer: Self-pay

## 2022-07-06 ENCOUNTER — Other Ambulatory Visit (HOSPITAL_COMMUNITY): Payer: Self-pay

## 2022-07-06 ENCOUNTER — Other Ambulatory Visit: Payer: Self-pay

## 2022-07-07 ENCOUNTER — Other Ambulatory Visit (HOSPITAL_COMMUNITY): Payer: Self-pay

## 2022-07-07 ENCOUNTER — Other Ambulatory Visit: Payer: Self-pay

## 2022-07-25 ENCOUNTER — Other Ambulatory Visit (HOSPITAL_COMMUNITY): Payer: Self-pay

## 2022-07-25 DIAGNOSIS — F4321 Adjustment disorder with depressed mood: Secondary | ICD-10-CM | POA: Diagnosis not present

## 2022-07-25 DIAGNOSIS — F411 Generalized anxiety disorder: Secondary | ICD-10-CM | POA: Diagnosis not present

## 2022-07-25 MED ORDER — FLUOXETINE HCL 20 MG PO CAPS
20.0000 mg | ORAL_CAPSULE | Freq: Every day | ORAL | 1 refills | Status: DC
  Filled 2022-07-25: qty 30, 30d supply, fill #0
  Filled 2022-09-23: qty 30, 30d supply, fill #1

## 2022-07-25 MED ORDER — ESCITALOPRAM OXALATE 20 MG PO TABS
20.0000 mg | ORAL_TABLET | Freq: Every day | ORAL | 0 refills | Status: DC
  Filled 2022-07-25: qty 30, 30d supply, fill #0

## 2022-08-04 ENCOUNTER — Other Ambulatory Visit: Payer: Self-pay

## 2022-08-04 ENCOUNTER — Other Ambulatory Visit (HOSPITAL_COMMUNITY): Payer: Self-pay

## 2022-08-06 ENCOUNTER — Other Ambulatory Visit: Payer: Self-pay | Admitting: Cardiovascular Disease

## 2022-08-07 ENCOUNTER — Other Ambulatory Visit (HOSPITAL_COMMUNITY): Payer: Self-pay

## 2022-08-08 ENCOUNTER — Other Ambulatory Visit (HOSPITAL_COMMUNITY): Payer: Self-pay

## 2022-08-08 MED ORDER — NITROGLYCERIN 0.3 MG SL SUBL
0.3000 mg | SUBLINGUAL_TABLET | SUBLINGUAL | 0 refills | Status: AC | PRN
  Filled 2022-08-08: qty 100, 5d supply, fill #0

## 2022-08-09 ENCOUNTER — Other Ambulatory Visit (HOSPITAL_COMMUNITY): Payer: Self-pay

## 2022-08-12 ENCOUNTER — Other Ambulatory Visit (HOSPITAL_COMMUNITY): Payer: Self-pay

## 2022-08-15 ENCOUNTER — Other Ambulatory Visit (HOSPITAL_COMMUNITY): Payer: Self-pay

## 2022-08-15 DIAGNOSIS — F411 Generalized anxiety disorder: Secondary | ICD-10-CM | POA: Diagnosis not present

## 2022-08-15 DIAGNOSIS — F4321 Adjustment disorder with depressed mood: Secondary | ICD-10-CM | POA: Diagnosis not present

## 2022-08-15 MED ORDER — FLUOXETINE HCL 40 MG PO CAPS
40.0000 mg | ORAL_CAPSULE | Freq: Every day | ORAL | 0 refills | Status: DC
  Filled 2022-08-15: qty 30, 30d supply, fill #0
  Filled 2022-09-10: qty 30, 30d supply, fill #1

## 2022-08-24 ENCOUNTER — Other Ambulatory Visit (HOSPITAL_COMMUNITY): Payer: Self-pay

## 2022-08-26 ENCOUNTER — Other Ambulatory Visit (HOSPITAL_COMMUNITY): Payer: Self-pay

## 2022-09-13 ENCOUNTER — Other Ambulatory Visit (HOSPITAL_COMMUNITY): Payer: Self-pay

## 2022-09-23 ENCOUNTER — Other Ambulatory Visit (HOSPITAL_COMMUNITY): Payer: Self-pay

## 2022-10-06 ENCOUNTER — Other Ambulatory Visit (HOSPITAL_COMMUNITY): Payer: Self-pay

## 2022-10-14 DIAGNOSIS — J189 Pneumonia, unspecified organism: Secondary | ICD-10-CM | POA: Diagnosis not present

## 2022-10-14 DIAGNOSIS — R062 Wheezing: Secondary | ICD-10-CM | POA: Diagnosis not present

## 2022-10-21 ENCOUNTER — Other Ambulatory Visit: Payer: Self-pay | Admitting: Pharmacist

## 2022-10-21 DIAGNOSIS — E782 Mixed hyperlipidemia: Secondary | ICD-10-CM

## 2022-10-21 MED ORDER — REPATHA SURECLICK 140 MG/ML ~~LOC~~ SOAJ
140.0000 mg | SUBCUTANEOUS | 3 refills | Status: DC
Start: 2022-10-21 — End: 2023-10-06

## 2022-10-27 ENCOUNTER — Encounter: Payer: Self-pay | Admitting: Cardiovascular Disease

## 2022-10-27 NOTE — Progress Notes (Signed)
Cardiology Office Note:    Date:  10/28/2022   ID:  David Conner, DOB 02-02-1960, MRN [id number]  PCP:  Joycelyn Rua, MD  Cardiologist:  Kristeen Miss, MD  Electrophysiologist:  None   Referring MD: Joycelyn Rua, MD   Chief Complaint  Patient presents with   Coronary Artery Disease         Nov. 15, 2019   David Conner is a 62 y.o. male with a recent diagnosis of coronary artery disease.  He was admitted to the hospital in October.  He was found to have a tight LAD stenosis which was stented.  He has a chronic total occlusion of a moderate-sized diagonal vessel.  He also has moderate to severe disease in his distal right coronary artery and obtuse marginal artery.  David Conner to have episodes of chest discomfort.  He saw David Leyden, NP week and his  Toprol-XL was  doubled and Imdur 30  was added . Also added Xanax   Has not had any syncope or presyncope  He seems to have fewer chest pains since the increase in meds  Also admits that he has not done as much   Dec. 9, 2019:  David Conner seen today for follow-up visit.  Wt = 207 lbs.  He was admitted to the hospital with symptoms of unstable angina.  He had PCI of his obtuse marginal arteries.  He still has severe diffuse disease involving his distal  left anterior descending artery. His D1 is 100%  occluded.   His angina is clearly better.   Has been taking it easy  Still has some chest pressure when he over did it last week  Is walking on the treadmill -  No angina when walking on the treadmill  Jan. 16, 2020  David Conner is seen back today for follow up visit He is still consumed by his diagnosis.   Seems to have lots of anxiety.  He has had stenting of his mid LAD Oct. 28, 2019.  The terminal LAD remains very small and severely diseased and is not a good target for revascularization Because of his numerous and sometimes conflicting symptoms, we scheduled a GXT He walked for 9 minutes of a Bruce protocol .  Achieved  a peak HR of 148 ( 90% predicted max HR ) and had no angina symptoms and no ECG changes.  He stopped due to fagiue and dyspnea.  We enrolled him in cardiac rehab but he has not started yet     He continued to have angina and returned to the cath lab on Nov. 26, 2019 and had stenting of his OM1 and OM2.  Because of his continued symptoms, we started Ranexa which is tolerated very poorly and has stopped   He is unable to eat and has lost weight. We have held is Rosuvastatin to see if this helps with the anorexia.    He has been evaluated by Gaynelle Adu of [facility] surgery and has a CT angio of the abdomen and pelvis scheduled tomorrow  to look for bowel ischemia .   Has intermittant sharp pain in his chest .   Typically if he does some exercise for a prolonged time - 30 min - 1 hr.  Relieved with Xanax  We tried Ranexa but caused him significant GI pain , nausea  We have tried higher dose Imdur 60 mg - has not tried to walk on his own at this time   Takes Xanax 3 times a  day .    Has lost 30 lbs  Has also been given Certriline but he became very resless and so he went back to the xanax .   Has been referred to a psychiatrist   His main complaint today is lack of appetite and subsequent fatigue   March 27, 2019:  David Conner is seen today for follow-up of his coronary artery disease.  He is feeling much better than when I saw him last year.  Is not getting much exercise.  Is some mild tightness in his chest with walking .  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  Lipid levels are reviewed - levels are great   March 27, 2020: David Conner is seen today for follow up of his CAD Feeling fine,  No Cp or dyspena  Not exercising as much  Work stress  Wt is 239 lbs.  Admits that he is eating more carbs than he should  We reviewed recent labs .  LDL looks great.  Trigs are mildly  elevate.   April 19, 2021 David Conner is seen for follow up of his CAD CP Was having symptoms of CP in Feb,2023 Diagnosed with COVID on 2/2/;23/  Developed CP  Cath showed a CTO of his mid/ distal LAD and D1 and 60% oM1 Compared to his cath in 2019, the distal LAD 99% stenosis is now a CTO The plan was for continued medical therapy  Indur was increased to 120 mg a day  We reduced the dose back down to 90 several weeks due to diarrhea and nausea He reduced further to Imdur 60 mg a day .  Is walking about a mile a day  Is now back on Lexipro - seems to be doing well on this   Still has a good bit on anxiety  He was having some abdominal pain and went to see his GI doctor.  They decided to do an upper endoscopy but this would require stopping the Brilinta for several days.  I do not think that that is indicated right now.  I suspect that his GI issues are a good bit anxiety and also might be a hold over from his COVID infection 2 months ago.  I would have no problems with an EGD if it did not require stopping the Brilinta.  I think he should concentrate on eating better including more vegetarian meals, more exercise.  And then see if he still has stomach issues 6 to 12 months for now.  He admits that he is already feeling better since he made the appointment for the EGD.  He will call and cancel the EGD.  Oct. 9, 2023 David Conner is seen for follow up of his CAD Has gained some weight  No CP. Still exercising .  Not as much as he was years ago.    Having some issues with anxiety.  Is trying several anti-anxiety meds.  He may need to have a colonoscopy in the near future. He has not had any significant episodes of angina.  He is at low risk for a colonoscopy. He may hold the aspirin and Brilinta for between 5 and 7 days prior to the colonoscopy.  He should restart the medicines as soon as it is deemed safe from a GI standpont    Oct. 11, 2024  David Conner is seen for follow up of his CAD  Walks, work on cards  Has some numbness in his left arm   His BP is generally normal  Occasionally has elevated BP  May be due to excess salt on the days prior     Past Medical History:  Diagnosis Date   Allergy    Complication of anesthesia    upper gi done, had anxiety with versed   Coronary artery disease    11/13/17:PCI to mid LAD using Resolute Onyx 3.0 x 18 mm DES   Depression    slight, on sertraline   ED (erectile dysfunction)    GERD (gastroesophageal reflux disease)    Hyperlipidemia    Nausea occasional   Restless legs syndrome     Past Surgical History:  Procedure Laterality Date   CARDIAC CATHETERIZATION     CHOLECYSTECTOMY N/A 02/18/2013   Procedure: LAPAROSCOPIC CHOLECYSTECTOMY WITH INTRAOPERATIVE CHOLANGIOGRAM, DIAGNOSTIC LAPAROSCOPY, LYSIS OF ADHESIONS ;  Surgeon: Atilano Ina, MD;  Location: [facility];  Service: General;  Laterality: N/A;   colonscopy  2 years ago   CORONARY PRESSURE/FFR STUDY N/A 03/08/2021   Procedure: INTRAVASCULAR PRESSURE WIRE/FFR STUDY;  Surgeon: Yvonne Kendall, MD;  Location: [facility] IN[facility]AS[facility] CV [facility];  Service: Cardiovascular;  Laterality: N/A;   CORONARY STENT INTERVENTION  11/13/2017   CORONARY STENT INTERVENTION N/A 11/13/2017   Procedure: CORONARY STENT INTERVENTION;  Surgeon: Yvonne Kendall, MD;  Location: [facility];  Service: Cardiovascular;  Laterality: N/A;   cortisone injection Left 3 weeks ago   dr thinks is bursitis   esophagus stretching     7-8 times   LAPAROSCOPIC APPENDECTOMY N/A 02/18/2013   Procedure: APPENDECTOMY LAPAROSCOPIC;  Surgeon: Atilano Ina, MD;  Location: [facility];  Service: General;  Laterality: N/A;  DR Andrey Campanile SPOKE WITH PT FAMILY DURING SURGERY ABOUT REMOVING PT APPENDIX DUE TO MALROTATION. FAMILY STATED TO PROCEDE WITH REMOVAL OF APPENDIX.    LEFT HEART CATH AND CORONARY ANGIOGRAPHY N/A 11/13/2017   Procedure: LEFT HEART CATH AND CORONARY ANGIOGRAPHY;  Surgeon: Yvonne Kendall, MD;  Location: [facility]  INVASIVE [facility];  Service: Cardiovascular;  Laterality: N/A;   LEFT HEART CATH AND CORONARY ANGIOGRAPHY N/A 12/12/2017   Procedure: LEFT HEART CATH AND CORONARY ANGIOGRAPHY;  Surgeon: Swaziland, Peter M, MD;  Location: [facility] INVASIVE CV LAB;  Service: Cardiovascular;  Laterality: N/A;   LEFT HEART CATH AND CORONARY ANGIOGRAPHY N/A 03/08/2021   Procedure: LEFT HEART CATH AND CORONARY ANGIOGRAPHY;  Surgeon: Yvonne Kendall, MD;  Location: [facility];  Service: Cardiovascular;  Laterality: N/A;    Current Medications: Current Meds  Medication Sig   acetaminophen (TYLENOL) 500 MG tablet Take 500 mg by mouth every 6 (six) hours as needed for moderate pain or mild pain.   ALPRAZolam (XANAX) 0.5 MG tablet Take 1 tablet (0.5 mg total) by mouth 3 (three) times daily as needed.   aspirin EC 81 MG tablet Take 81 mg by mouth daily.    esomeprazole (NEXIUM) 40 MG capsule Take 1 capsule (40 mg total) by mouth daily.   Evolocumab (REPATHA SURECLICK) 140 MG/ML SOAJ Inject 140 mg into the skin every 14 (fourteen) days.   FLUoxetine (PROZAC) 20 MG capsule Take 1 capsule by mouth once daily   isosorbide mononitrate (IMDUR) 60 MG 24 hr tablet Take 1 and 1/2 tablets (90 mg total) by mouth daily.   metoprolol succinate (TOPROL-XL) 100 MG 24 hr tablet TAKE 1 TABLET BY MOUTH TWO TIMES DAILY WITH OR IMMEDIATELY FOLLOWING A MEAL   nitroGLYCERIN (NITROSTAT) 0.3 MG  SL tablet Place 1 tablet under the tongue to dissolve every 5 minutes as needed for chest pain. Appointment needed for refills. Call office please.   ticagrelor (BRILINTA) 60 MG TABS tablet Take 1 tablet (60 mg total) by mouth 2 (two) times daily.   zolpidem (AMBIEN) 10 MG tablet 1 tablet at bedtime Orally Once daily prn for 90 days   [DISCONTINUED] zolpidem (AMBIEN) 10 MG tablet Take 1 tablet (10 mg total) by mouth at bedtime as needed.     Allergies:   Penicillin g   Social History   Socioeconomic History   Marital status: Married    Spouse name:  Not on file   Number of children: Not on file   Years of education: Not on file   Highest education level: Master's degree (e.g., MA, MS, MEng, MEd, MSW, MBA)  Occupational History   Not on file  Tobacco Use   Smoking status: Never   Smokeless tobacco: Never  Vaping Use   Vaping status: Never Used  Substance and Sexual Activity   Alcohol use: No   Drug use: No   Sexual activity: Yes    Birth control/protection: Sponge  Other Topics Concern   Not on file  Social History Narrative   Not on file   Social Determinants of Health   Financial Resource Strain: Low Risk  (02/06/2018)   Overall Financial Resource Strain (CARDIA)    Difficulty of Paying Living Expenses: Not very hard  Food Insecurity: No Food Insecurity (02/06/2018)   Hunger Vital Sign    Worried About Running Out of Food in the Last Year: Never true    Ran Out of Food in the Last Year: Never true  Transportation Needs: No Transportation Needs (02/06/2018)   PRAPARE - Administrator, Civil Service (Medical): No    Lack of Transportation (Non-Medical): No  Physical Activity: Inactive (02/06/2018)   Exercise Vital Sign    Days of Exercise per Week: 0 days    Minutes of Exercise per Session: 0 min  Stress: Stress Concern Present (02/06/2018)   [employer] of Occupational Health - Occupational Stress Questionnaire    Feeling of Stress : Very much  Social Connections: Unknown (05/28/2021)   Received from [facility]   Social Network    Social Network: Not on file     Family History: The patient's family history includes Bladder Cancer in his father; CAD in his maternal grandfather and mother; Diabetes in his father; Healthy in his sister; Hyperlipidemia in his mother; Hypertension in his father and mother.  ROS:   Listed in HPI, otherwise he is doing well ROS is negative     EKGs/Labs/Other Studies Reviewed:       Recent Labs: No results found for requested labs within last 365  days.  Recent Lipid Panel    Component Value Date/Time   CHOL 97 (L) 10/25/2021 1458   TRIG 128 10/25/2021 1458   HDL 33 (L) 10/25/2021 1458   CHOLHDL 2.9 10/25/2021 1458   CHOLHDL 4.7 11/11/2017 2043   VLDL 8 11/11/2017 2043   LDLCALC 41 10/25/2021 1458    Physical Exam:     Physical Exam: Blood pressure 112/80, pulse 66, height 5' 10.5" (1.791 m), weight 241 lb 9.6 oz (109.6 kg), SpO2 93%.       GEN:  Well nourished, well developed in no acute distress HEENT: Normal NECK: No JVD; No carotid bruits LYMPHATICS: No lymphadenopathy CARDIAC: RRR , no murmurs, rubs, gallops  RESPIRATORY:  Clear to auscultation without rales, wheezing or rhonchi  ABDOMEN: Soft, non-tender, non-distended MUSCULOSKELETAL:  No edema; No deformity  SKIN: Warm and dry NEUROLOGIC:  Alert and oriented x 3   EKG:    EKG Interpretation Date/Time:  Friday October 28 2022 15:28:24 EDT Ventricular Rate:  67 PR Interval:  170 QRS Duration:  94 QT Interval:  406 QTC Calculation: 429 R Axis:   -4  Text Interpretation: Normal sinus rhythm Inferior infarct , age undetermined When compared with ECG of 08-Mar-2021 08:21, No significant change was found Confirmed by Kristeen Miss ([id number]) on 10/28/2022 3:42:17 PM     ASSESSMENT:    1. CAD S/P percutaneous coronary angioplasty     PLAN:     1.  Coronary artery disease:    no angina .   Has some unusual left arm numbness  Doubt  this is angina   . 2.  Weight gain:     Advised him to lose weight   2.  Hyperlipidemia:    Check lipids today  Cont meds.      Medication Adjustments/Labs and Tests Ordered: Current medicines are reviewed at length with the patient today.  Concerns regarding medicines are outlined above.  Orders Placed This Encounter  Procedures   EKG 12-Lead   No orders of the defined types were placed in this encounter.   There are no Patient Instructions on file for this visit.

## 2022-10-28 ENCOUNTER — Ambulatory Visit: Payer: BC Managed Care – PPO | Attending: Cardiovascular Disease | Admitting: Cardiovascular Disease

## 2022-10-28 VITALS — BP 112/80 | HR 66 | Ht 70.5 in | Wt 241.6 lb

## 2022-10-28 DIAGNOSIS — Z9861 Coronary angioplasty status: Secondary | ICD-10-CM

## 2022-10-28 DIAGNOSIS — E782 Mixed hyperlipidemia: Secondary | ICD-10-CM

## 2022-10-28 DIAGNOSIS — I2510 Atherosclerotic heart disease of native coronary artery without angina pectoris: Secondary | ICD-10-CM | POA: Diagnosis not present

## 2022-10-28 NOTE — Patient Instructions (Signed)
Medication Instructions:  Your physician recommends that you continue on your current medications as directed. Please refer to the Current Medication list given to you today.  *If you need a refill on your cardiac medications before your next appointment, please call your pharmacy*  Lab Work: TODAY: Lipid panel, ALT, BMP If you have labs (blood work) drawn today and your tests are completely normal, you will receive your results only by: MyChart Message (if you have MyChart) OR A paper copy in the mail If you have any lab test that is abnormal or we need to change your treatment, we will call you to review the results.  Testing/Procedures: None ordered today.  Follow-Up: At [facility], you and your health needs are our priority.  As part of our continuing mission to provide you with exceptional heart care, we have created designated Provider Care Teams.  These Care Teams include your primary Cardiologist (physician) and Advanced Practice Providers (APPs -  Physician Assistants and Nurse Practitioners) who all work together to provide you with the care you need, when you need it.  Your next appointment:   1 year(s)  The format for your next appointment:   In Person  Provider:   Kristeen Miss, MD

## 2022-10-29 LAB — LIPID PANEL
Chol/HDL Ratio: 3.2 {ratio} (ref 0.0–5.0)
Cholesterol, Total: 136 mg/dL (ref 100–199)
HDL: 43 mg/dL (ref >39)
LDL Chol Calc (NIH): 49 mg/dL (ref 0–99)
Triglycerides: 288 mg/dL — ABNORMAL HIGH (ref 0–149)
VLDL Cholesterol Cal: 44 mg/dL — ABNORMAL HIGH (ref 5–40)

## 2022-10-29 LAB — BASIC METABOLIC PANEL
BUN/Creatinine Ratio: 15 (ref 10–24)
BUN: 15 mg/dL (ref 8–27)
CO2: 21 mmol/L (ref 20–29)
Calcium: 9.9 mg/dL (ref 8.6–10.2)
Chloride: 104 mmol/L (ref 96–106)
Creatinine, Ser: 0.98 mg/dL (ref 0.76–1.27)
Glucose: 108 mg/dL — ABNORMAL HIGH (ref 70–99)
Potassium: 4.3 mmol/L (ref 3.5–5.2)
Sodium: 140 mmol/L (ref 134–144)
eGFR: 87 mL/min/{1.73_m2} (ref >59)

## 2022-10-29 LAB — ALT: ALT: 28 [IU]/L (ref 0–44)

## 2022-11-07 DIAGNOSIS — Z23 Encounter for immunization: Secondary | ICD-10-CM | POA: Diagnosis not present

## 2022-11-07 DIAGNOSIS — Z0000 Encounter for general adult medical examination without abnormal findings: Secondary | ICD-10-CM | POA: Diagnosis not present

## 2022-11-14 DIAGNOSIS — F411 Generalized anxiety disorder: Secondary | ICD-10-CM | POA: Diagnosis not present

## 2022-11-14 DIAGNOSIS — R059 Cough, unspecified: Secondary | ICD-10-CM | POA: Diagnosis not present

## 2022-11-14 DIAGNOSIS — F4321 Adjustment disorder with depressed mood: Secondary | ICD-10-CM | POA: Diagnosis not present

## 2022-11-17 ENCOUNTER — Other Ambulatory Visit: Payer: Self-pay | Admitting: Cardiovascular Disease

## 2022-11-29 ENCOUNTER — Ambulatory Visit: Payer: Managed Care, Other (non HMO) | Admitting: Cardiovascular Disease

## 2022-12-14 DIAGNOSIS — L821 Other seborrheic keratosis: Secondary | ICD-10-CM | POA: Diagnosis not present

## 2022-12-14 DIAGNOSIS — D2239 Melanocytic nevi of other parts of face: Secondary | ICD-10-CM | POA: Diagnosis not present

## 2022-12-31 ENCOUNTER — Other Ambulatory Visit (HOSPITAL_COMMUNITY): Payer: Self-pay

## 2023-01-04 DIAGNOSIS — F411 Generalized anxiety disorder: Secondary | ICD-10-CM | POA: Diagnosis not present

## 2023-01-04 DIAGNOSIS — F4321 Adjustment disorder with depressed mood: Secondary | ICD-10-CM | POA: Diagnosis not present

## 2023-02-02 DIAGNOSIS — Z5181 Encounter for therapeutic drug level monitoring: Secondary | ICD-10-CM | POA: Diagnosis not present

## 2023-02-02 DIAGNOSIS — F4321 Adjustment disorder with depressed mood: Secondary | ICD-10-CM | POA: Diagnosis not present

## 2023-02-02 DIAGNOSIS — F411 Generalized anxiety disorder: Secondary | ICD-10-CM | POA: Diagnosis not present

## 2023-02-08 ENCOUNTER — Other Ambulatory Visit (HOSPITAL_COMMUNITY): Payer: Self-pay

## 2023-02-08 ENCOUNTER — Telehealth: Payer: Self-pay | Admitting: Pharmacy Technician

## 2023-02-08 NOTE — Telephone Encounter (Signed)
Pharmacy Patient Advocate Encounter   Received notification from Fax that prior authorization for Repatha SureClick 140MG /ML auto-injectors is required/requested.   Insurance verification completed.   The patient is insured through [facility] .   Per test claim: PA required; PA submitted to above mentioned insurance via CoverMyMeds Key/confirmation #/EOC [id number] Status is pending

## 2023-02-09 ENCOUNTER — Other Ambulatory Visit (HOSPITAL_COMMUNITY): Payer: Self-pay

## 2023-02-09 NOTE — Telephone Encounter (Signed)
Plan requested more information. Sent 02/09/23

## 2023-02-09 NOTE — Telephone Encounter (Signed)
Pharmacy Patient Advocate Encounter  Received notification from [facility] that Prior Authorization for repatha 140mg /ml has been DENIED.  See denial reason below. No denial letter attached in CMM. Will attach denial letter to Media tab once received.  I called the insurance and they said it was denied due to the patient has LDL of under 70 now. I explained that was because he is on repatha but it was still denied.   PA #/Case ID/Reference #: [id number]

## 2023-02-10 ENCOUNTER — Other Ambulatory Visit: Payer: Self-pay | Admitting: Cardiovascular Disease

## 2023-02-10 NOTE — Telephone Encounter (Signed)
To a peer to peer to get this issue resolved quickly however they stated that in peer to peer they cannot override decision just review it.  Therefore I will need to appeal.  Appeal was opened.  I will fax over letter appealing decision as this is a continuation of therapy. [phone] A Fax [phone]  Appeals faxed over. Patient made aware we are appealing.

## 2023-02-20 NOTE — Telephone Encounter (Signed)
Appeals overturned. Repatha approved. Pt made aware.

## 2023-03-13 ENCOUNTER — Other Ambulatory Visit: Payer: Self-pay | Admitting: Cardiovascular Disease

## 2023-03-13 DIAGNOSIS — F411 Generalized anxiety disorder: Secondary | ICD-10-CM | POA: Diagnosis not present

## 2023-03-13 DIAGNOSIS — F4321 Adjustment disorder with depressed mood: Secondary | ICD-10-CM | POA: Diagnosis not present

## 2023-03-22 DIAGNOSIS — Z6834 Body mass index (BMI) 34.0-34.9, adult: Secondary | ICD-10-CM | POA: Diagnosis not present

## 2023-03-22 DIAGNOSIS — R7301 Impaired fasting glucose: Secondary | ICD-10-CM | POA: Diagnosis not present

## 2023-04-04 DIAGNOSIS — Z7189 Other specified counseling: Secondary | ICD-10-CM | POA: Diagnosis not present

## 2023-04-05 ENCOUNTER — Encounter: Payer: Self-pay | Admitting: Cardiovascular Disease

## 2023-04-10 DIAGNOSIS — F411 Generalized anxiety disorder: Secondary | ICD-10-CM | POA: Diagnosis not present

## 2023-04-10 DIAGNOSIS — F4321 Adjustment disorder with depressed mood: Secondary | ICD-10-CM | POA: Diagnosis not present

## 2023-04-19 DIAGNOSIS — N401 Enlarged prostate with lower urinary tract symptoms: Secondary | ICD-10-CM | POA: Diagnosis not present

## 2023-04-19 DIAGNOSIS — N5201 Erectile dysfunction due to arterial insufficiency: Secondary | ICD-10-CM | POA: Diagnosis not present

## 2023-04-19 DIAGNOSIS — R351 Nocturia: Secondary | ICD-10-CM | POA: Diagnosis not present

## 2023-06-05 DIAGNOSIS — E669 Obesity, unspecified: Secondary | ICD-10-CM | POA: Diagnosis not present

## 2023-07-10 DIAGNOSIS — F4321 Adjustment disorder with depressed mood: Secondary | ICD-10-CM | POA: Diagnosis not present

## 2023-07-10 DIAGNOSIS — F411 Generalized anxiety disorder: Secondary | ICD-10-CM | POA: Diagnosis not present

## 2023-08-18 DIAGNOSIS — L600 Ingrowing nail: Secondary | ICD-10-CM | POA: Diagnosis not present

## 2023-08-18 DIAGNOSIS — M79675 Pain in left toe(s): Secondary | ICD-10-CM | POA: Diagnosis not present

## 2023-08-18 DIAGNOSIS — L603 Nail dystrophy: Secondary | ICD-10-CM | POA: Diagnosis not present

## 2023-10-05 ENCOUNTER — Telehealth: Payer: Self-pay | Admitting: Physician Assistant

## 2023-10-05 NOTE — Telephone Encounter (Signed)
*  STAT* If patient is at the pharmacy, call can be transferred to refill team.   1. Which medications need to be refilled? (please list name of each medication and dose if known) Evolocumab  (REPATHA  SURECLICK) 140 MG/ML SOAJ    2. Would you like to learn more about the convenience, safety, & potential cost savings by using the [facility]? No   3. Are you open to using the [facility] (Type [facility]. No   4. Which pharmacy/location (including street and city if local pharmacy) is medication to be sent to?[id number] [place] [place] - [address]    5. Do they need a 30 day or 90 day supply? 90 day   Pt is out of medication. Pt scheduled with [facility] 10/21

## 2023-10-06 ENCOUNTER — Other Ambulatory Visit: Payer: Self-pay | Admitting: Physician Assistant

## 2023-10-06 MED ORDER — REPATHA SURECLICK 140 MG/ML ~~LOC~~ SOAJ: 140.0000 mg | SUBCUTANEOUS | 3 refills | Status: AC

## 2023-11-06 NOTE — Progress Notes (Unsigned)
 Cardiology Office Note:    Date:  11/07/2023   ID:  David Conner, DOB [birth year], MRN [id number]  PCP:  Nanci Senior, MD   [facility] Providers Cardiologist: Previously Dr. Alveta, will establish with Emeline FORBES Calender, MD  Click to update primary MD,subspecialty MD or APP then REFRESH:1}    Referring MD: Nanci Senior, MD   Chief complaint: Annual follow-up of CAD     History of Present Illness:   David Conner is a 63 y.o. male with a hx of CAD s/p PCI 10/2017 to mLAD, persistent angina s/p DES to OM1 and DES to OM2 11/2017, unable to tolerate Ranexa , GERD, hyperlipidemia, presenting to office today for follow-up of CAD.  10/2017 was admitted to the hospital with chest pain and NSTEMI. LHC 11/13/2017 showed multivessel CAD, PCI to mid LAD 99% stenosis with 3.0 X18 DES resulting in 0% stenosis and TIMI-3 flow.  Moderate-severe OM disease, mild-moderate diffuse disease of nondominant RCA.  CTO of distal LAD and D1.  LCx not mentioned.  Aggressive secondary prevention recommended, uninterrupted DAPT with aspirin  and Brilinta  were recommended.  Echo 12/07/2017: LVEF 60-65%, mild LVH, normal systolic function, no RWMA, normal diastolic parameters, normal AV valve, normal ascending aorta, normal mitral valve, normal left atrium, normal RV, trivial tricuspid regurgitation.  Patient continued to have angina and was returned to the Cath Lab 12/12/2017 for stenting of OM1 and OM 2. Ranexa  was poorly tolerated and stopped.  Rosuvastatin  was held 2/2 anorexia.  March the following year patient had improved significantly.  Further complaints of chest pain in 2023.  LHC 03/08/2021: Multivessel coronary artery disease, as detailed below, including CTO's of mid/distal LAD and D1, as well as 50-60% ostial OM1 (RFR = 0.97), and moderate-severe multifocal disease involving nondominant RCA. Compared to 2019, 99% distal LAD disease is now a chronic total occlusion.  Normal LVEDP with  upper normal filling pressure of 15 mmHg.  No targets for intervention, medical therapy was recommended to be continued.  Ticagrelor  decreased to 60 mg twice daily given most recent PCI was over 12 months prior, recommended aggressive secondary prevention.  Patient's last cardiology OV was 10/28/2022, was doing well from cardiac standpoint at that time.  Patient presents to the clinic alone today, overall doing well from a cardiac standpoint. He denies chest pain, dyspnea, orthopnea, n, v,  dark/tarry/bloody stools, hematuria, dizziness, syncope, edema, weight gain, excessive bleeding/bruising/rash.  Has noticed occasional fast heart rate following large meals at nighttime, happening a few times throughout the week.  No associated symptoms.  Takes Ativan to help relieve palpitations.  Reports rare unsteadiness with positional changes, possibly secondary to dehydration. Reports a 15 pound weight loss over the last 6 months since starting Wegovy.  States he also noticed palpitations with strenuous activity, has taken a nitro for this once or twice in the past with no change/relief in symptoms.  BPs reported well-controlled at home, checks them weekly, ranging 120s/80s normally.  ROS:   Please see the history of present illness.    All other systems reviewed and are negative.     Past Medical History:  Diagnosis Date   Allergy    Complication of anesthesia    upper gi done, had anxiety with versed    Coronary artery disease    11/13/17:PCI to mid LAD using Resolute Onyx 3.0 x 18 mm DES   Depression    slight, on sertraline    ED (erectile dysfunction)    GERD (gastroesophageal reflux disease)  Hyperlipidemia    Nausea occasional   Restless legs syndrome     Past Surgical History:  Procedure Laterality Date   CARDIAC CATHETERIZATION     CHOLECYSTECTOMY N/A 02/18/2013   Procedure: LAPAROSCOPIC CHOLECYSTECTOMY WITH INTRAOPERATIVE CHOLANGIOGRAM, DIAGNOSTIC LAPAROSCOPY, LYSIS OF ADHESIONS ;   Surgeon: Camellia CHRISTELLA Blush, MD;  Location: [facility];  Service: General;  Laterality: N/A;   colonscopy  2 years ago   CORONARY PRESSURE/FFR STUDY N/A 03/08/2021   Procedure: INTRAVASCULAR PRESSURE WIRE/FFR STUDY;  Surgeon: Mady Bruckner, MD;  Location: [facility];  Service: Cardiovascular;  Laterality: N/A;   CORONARY STENT INTERVENTION  11/13/2017   CORONARY STENT INTERVENTION N/A 11/13/2017   Procedure: CORONARY STENT INTERVENTION;  Surgeon: Mady Bruckner, MD;  Location: [facility];  Service: Cardiovascular;  Laterality: N/A;   cortisone injection Left 3 weeks ago   dr thinks is bursitis   esophagus stretching     7-8 times   LAPAROSCOPIC APPENDECTOMY N/A 02/18/2013   Procedure: APPENDECTOMY LAPAROSCOPIC;  Surgeon: Camellia CHRISTELLA Blush, MD;  Location: [facility];  Service: General;  Laterality: N/A;  DR BLUSH SPOKE WITH PT FAMILY DURING SURGERY ABOUT REMOVING PT APPENDIX DUE TO MALROTATION. FAMILY STATED TO PROCEDE WITH REMOVAL OF APPENDIX.    LEFT HEART CATH AND CORONARY ANGIOGRAPHY N/A 11/13/2017   Procedure: LEFT HEART CATH AND CORONARY ANGIOGRAPHY;  Surgeon: Mady Bruckner, MD;  Location: [facility];  Service: Cardiovascular;  Laterality: N/A;   LEFT HEART CATH AND CORONARY ANGIOGRAPHY N/A 12/12/2017   Procedure: LEFT HEART CATH AND CORONARY ANGIOGRAPHY;  Surgeon: Swaz[place], Peter M, MD;  Location: [facility] INVASIVE CV LAB;  Service: Cardiovascular;  Laterality: N/A;   LEFT HEART CATH AND CORONARY ANGIOGRAPHY N/A 03/08/2021   Procedure: LEFT HEART CATH AND CORONARY ANGIOGRAPHY;  Surgeon: Mady Bruckner, MD;  Location: [facility];  Service: Cardiovascular;  Laterality: N/A;    Current Medications: Current Meds  Medication Sig   acetaminophen  (TYLENOL ) 500 MG tablet Take 500 mg by mouth every 6 (six) hours as needed for moderate pain or mild pain.   ALPRAZolam  (XANAX ) 0.5 MG tablet Take 1 tablet (0.5 mg total) by mouth 3 (three) times daily as needed.   aspirin  EC 81 MG  tablet Take 81 mg by mouth daily.    desvenlafaxine (PRISTIQ) 100 MG 24 hr tablet Take 100 mg by mouth daily.   esomeprazole  (NEXIUM ) 40 MG capsule Take 1 capsule (40 mg total) by mouth daily.   Evolocumab  (REPATHA  SURECLICK) 140 MG/ML SOAJ Inject 140 mg into the skin every 14 (fourteen) days.   FLUoxetine  (PROZAC ) 20 MG capsule Take 1 capsule by mouth once daily   isosorbide  mononitrate (IMDUR ) 60 MG 24 hr tablet TAKE 1 AND 1/2 TABLETS DAILY BY MOUTH   metoprolol  succinate (TOPROL -XL) 100 MG 24 hr tablet TAKE 1 TABLET BY MOUTH 2 TIMES DAILY WITH OR IMMEDIATELY FOLOWING A MEAL   nitroGLYCERIN  (NITROSTAT ) 0.3 MG SL tablet Place 1 tablet under the tongue to dissolve every 5 minutes as needed for chest pain. Appointment needed for refills. Call office please.   ticagrelor  (BRILINTA ) 60 MG TABS tablet TAKE 1 TABLET BY MOUTH 2 TIMES DAILY   WEGOVY 2.4 MG/0.75ML SOAJ SQ injection Inject 2.4 mg into the skin once a week.   zolpidem  (AMBIEN ) 10 MG tablet 1 tablet at bedtime Orally Once daily prn for 90 days     Allergies:   Penicillin g   Social History   Socioeconomic History  Marital status: Married    Spouse name: Not on file   Number of children: Not on file   Years of education: Not on file   Highest education level: Master's degree (e.g., MA, MS, MEng, MEd, MSW, MBA)  Occupational History   Not on file  Tobacco Use   Smoking status: Never   Smokeless tobacco: Never  Vaping Use   Vaping status: Never Used  Substance and Sexual Activity   Alcohol use: No   Drug use: No   Sexual activity: Yes    Birth control/protection: Sponge  Other Topics Concern   Not on file  Social History Narrative   Not on file   Social Drivers of Health   Financial Resource Strain: Low Risk  (02/06/2018)   Overall Financial Resource Strain (CARDIA)    Difficulty of Paying Living Expenses: Not very hard  Food Insecurity: No Food Insecurity (02/06/2018)   Hunger Vital Sign    Worried About Running Out  of Food in the Last Year: Never true    Ran Out of Food in the Last Year: Never true  Transportation Needs: No Transportation Needs (02/06/2018)   PRAPARE - Administrator, Civil Service (Medical): No    Lack of Transportation (Non-Medical): No  Physical Activity: Inactive (02/06/2018)   Exercise Vital Sign    Days of Exercise per Week: 0 days    Minutes of Exercise per Session: 0 min  Stress: Stress Concern Present (02/06/2018)   [employer] of Occupational Health - Occupational Stress Questionnaire    Feeling of Stress : Very much  Social Connections: Unknown (05/28/2021)   Received from [facility]   Social Network    Social Network: Not on file     Family History: The patient's family history includes Bladder Cancer in his father; CAD in his maternal grandfather and mother; Diabetes in his father; Healthy in his sister; Hyperlipidemia in his mother; Hypertension in his father and mother.  EKGs/Labs/Other Studies Reviewed:    The following studies were reviewed today:  EKG Interpretation Date/Time:  Tuesday November 07 2023 08:17:57 EDT Ventricular Rate:  73 PR Interval:  176 QRS Duration:  90 QT Interval:  400 QTC Calculation: 440 R Axis:   -14  Text Interpretation: Normal sinus rhythm Inferior infarct (cited on or before 28-Oct-2022) When compared with ECG of 28-Oct-2022 15:28, No significant change was found Confirmed by Maniah Nading [phone]) on 11/07/2023 8:57:50 AM    Recent Labs: No results found for requested labs within last 365 days.  Recent Lipid Panel    Component Value Date/Time   CHOL 136 10/28/2022 1557   TRIG 288 (H) 10/28/2022 1557   HDL 43 10/28/2022 1557   CHOLHDL 3.2 10/28/2022 1557   CHOLHDL 4.7 11/11/2017 2043   VLDL 8 11/11/2017 2043   LDLCALC 49 10/28/2022 1557    Physical Exam:    VS:  BP 118/88   Pulse 73   Ht 5' 10.5 (1.791 m)   Wt 235 lb 9.6 oz (106.9 kg)   SpO2 98%   BMI 33.33 kg/m        Wt Readings from  Last 3 Encounters:  11/07/23 235 lb 9.6 oz (106.9 kg)  10/28/22 241 lb 9.6 oz (109.6 kg)  10/25/21 242 lb (109.8 kg)     GEN:  Well nourished, well developed in no acute distress HEENT: Normal NECK: No carotid bruits CARDIAC:  S1-S2 normal, RRR, no murmurs, rubs, gallops RESPIRATORY:  Clear to auscultation without rales,  wheezing or rhonchi  MUSCULOSKELETAL:  No edema; No deformity, DP/PT 2+ SKIN: Warm and dry NEUROLOGIC:  Alert and oriented x 3 PSYCHIATRIC:  Normal affect       Assessment & Plan Palpitations Coronary artery disease involving native coronary artery of native heart without angina pectoris EKG: Normal sinus rhythm, inferior infarct (old), 73 bpm, no significant change from prior studies. Denies chest pain, SOB, orthopnea, near-syncope Reports occasional palpitations/fast heart rate, most often occurring after large meals at nighttime.  Relieved with Ativan.  Noticed a couple episodes with strenuous exertion, nitroglycerin  taken with no change in symptoms. Will order ZIO monitoring to evaluate for tachycardias/arrhythmias Previous echo in 2019 showed mild LVH, trivial tricuspid/pulmonic regurgitation, will repeat echo to  observe for structural abnormalities Does not currently sound ischemic given relief with Ativan, no relief with nitroglycerin , no associated CP/SOB/fatigue/nausea.  If echo reveals new RWMA could consider further ischemic workup at that time with NM PET stress test. Continue aspirin  ED 81 mg daily Continue imdur  60 mg daily Continue toprol -xl 100 mg daily Continue brilinta  60 mg twice daily Continue nitroglycerin  0.3 mg SL prn chest pain every 5 minutes  Given continue DAPT therapy will order CBC Will order TSH to rule out thyroid abnormalities given palpitations Hyperlipidemia, unspecified hyperlipidemia type Lipid panel 10/28/2022: chol 136, HDL 43, LDL 49, triglycerides 288 LFTs: ALT 28 (10/28/2022), AST 32 (02/25/2021) Historical statin  intoleranance Will order fasting lipids and CMP Continue Repatha  140 mg SQ inj x 2 weeks   Follow-up in 3 months with MD or APP    Medication Adjustments/Labs and Tests Ordered: Current medicines are reviewed at length with the patient today.  Concerns regarding medicines are outlined above.  Orders Placed This Encounter  Procedures   CBC   Comp Met (CMET)   TSH   Lipid Profile   LONG TERM MONITOR (3-14 DAYS)   EKG 12-Lead   ECHOCARDIOGRAM COMPLETE   No orders of the defined types were placed in this encounter.   Patient Instructions  Medication Instructions:  Your physician recommends that you continue on your current medications as directed. Please refer to the Current Medication list given to you today.  *If you need a refill on your cardiac medications before your next appointment, please call your pharmacy*  Lab Work: Today- CBC, TSH, CMET, Fasting LIPIDs If you have labs (blood work) drawn today and your tests are completely normal, you will receive your results only by: MyChart Message (if you have MyChart) OR A paper copy in the mail If you have any lab test that is abnormal or we need to change your treatment, we will call you to review the results.  Testing/Procedures: GEOFFRY HEWS- Long Term Monitor Instructions  Your physician has requested you wear a ZIO patch monitor for 7 days.  This is a single patch monitor. [employer] supplies one patch monitor per enrollment. Additional stickers are not available. Please do not apply patch if you will be having a Nuclear Stress Test,  Echocardiogram, Cardiac CT, MRI, or Chest Xray during the period you would be wearing the  monitor. The patch cannot be worn during these tests. You cannot remove and re-apply the  ZIO XT patch monitor.  Billing and Patient Assistance Program Information  We have supplied [employer] with any of your insurance information on file for billing purposes. [employer] offers a sliding scale Patient Assistance  Program for patients that do not have  insurance, or whose insurance does not completely cover the cost of  the ZIO monitor.  You must apply for the Patient Assistance Program to qualify for this discounted rate.  To apply, please call [employer] at [phone], select option 4, select option 2, ask to apply for  Patient Assistance Program. Meredeth will ask your household income, and how many people  are in your household. They will quote your out-of-pocket cost based on that information.  [employer] will also be able to set up a 54-month, interest-free payment plan if needed.  When you are ready to remove the patch, follow instructions on the last 2 pages of Patient  Logbook. Stick patch monitor onto the last page of Patient Logbook.  Place Patient Logbook in the blue and white box. Use locking tab on box and tape box closed  securely. The blue and white box has prepaid postage on it. Please place it in the mailbox as  soon as possible. Your physician should have your test results approximately 7 days after the  monitor has been mailed back to [facility].  Call [facility] Customer Care at [phone] if you have questions regarding  your ZIO XT patch monitor. Call them immediately if you see an orange light blinking on your  monitor.  If your monitor falls off in less than 4 days, contact our Monitor department at [phone].  If your monitor becomes loose or falls off after 4 days call [employer] at [phone] for  suggestions on securing your monitor    Your physician has requested that you have an echocardiogram. Echocardiography is a painless test that uses sound waves to create images of your heart. It provides your doctor with information about the size and shape of your heart and how well your heart's chambers and valves are working. This procedure takes approximately one hour. There are no restrictions for this procedure. Please do NOT wear cologne, perfume, aftershave, or  lotions (deodorant is allowed). Please arrive 15 minutes prior to your appointment time.  Please note: We ask at that you not bring children with you during ultrasound (echo/ vascular) testing. Due to room size and safety concerns, children are not allowed in the ultrasound rooms during exams. Our front office staff cannot provide observation of children in our lobby area while testing is being conducted. An adult accompanying a patient to their appointment will only be allowed in the ultrasound room at the discretion of the ultrasound technician under special circumstances. We apologize for any inconvenience.   Follow-Up: At [facility], you and your health needs are our priority.  As part of our continuing mission to provide you with exceptional heart care, our providers are all part of one team.  This team includes your primary Cardiologist (physician) and Advanced Practice Providers or APPs (Physician Assistants and Nurse Practitioners) who all work together to provide you with the care you need, when you need it.  Your next appointment:   3 month(s)  Provider:   Emeline FORBES Calender, MD or Miriam Shams, NP                    Signed, Jovanni Rash E Raeshawn Vo, NP  11/07/2023 9:11 AM    [facility]

## 2023-11-07 ENCOUNTER — Encounter: Payer: Self-pay | Admitting: Physician Assistant

## 2023-11-07 ENCOUNTER — Ambulatory Visit

## 2023-11-07 ENCOUNTER — Ambulatory Visit: Attending: Physician Assistant | Admitting: Emergency Medicine

## 2023-11-07 VITALS — BP 118/88 | HR 73 | Ht 70.5 in | Wt 235.6 lb

## 2023-11-07 DIAGNOSIS — I2510 Atherosclerotic heart disease of native coronary artery without angina pectoris: Secondary | ICD-10-CM | POA: Diagnosis not present

## 2023-11-07 DIAGNOSIS — E785 Hyperlipidemia, unspecified: Secondary | ICD-10-CM | POA: Diagnosis not present

## 2023-11-07 DIAGNOSIS — R002 Palpitations: Secondary | ICD-10-CM

## 2023-11-07 LAB — CBC

## 2023-11-07 NOTE — Patient Instructions (Signed)
 Medication Instructions:  Your physician recommends that you continue on your current medications as directed. Please refer to the Current Medication list given to you today.  *If you need a refill on your cardiac medications before your next appointment, please call your pharmacy*  Lab Work: Today- CBC, TSH, CMET, Fasting LIPIDs If you have labs (blood work) drawn today and your tests are completely normal, you will receive your results only by: MyChart Message (if you have MyChart) OR A paper copy in the mail If you have any lab test that is abnormal or we need to change your treatment, we will call you to review the results.  Testing/Procedures: David Conner- Long Term Monitor Instructions  Your physician has requested you wear a ZIO patch monitor for 7 days.  This is a single patch monitor. [employer] supplies one patch monitor per enrollment. Additional stickers are not available. Please do not apply patch if you will be having a Nuclear Stress Test,  Echocardiogram, Cardiac CT, MRI, or Chest Xray during the period you would be wearing the  monitor. The patch cannot be worn during these tests. You cannot remove and re-apply the  ZIO XT patch monitor.  Billing and Patient Assistance Program Information  We have supplied [employer] with any of your insurance information on file for billing purposes. [employer] offers a sliding scale Patient Assistance Program for patients that do not have  insurance, or whose insurance does not completely cover the cost of the ZIO monitor.  You must apply for the Patient Assistance Program to qualify for this discounted rate.  To apply, please call [employer] at [phone], select option 4, select option 2, ask to apply for  Patient Assistance Program. David Conner will ask your household income, and how many people  are in your household. They will quote your out-of-pocket cost based on that information.  [employer] will also be able to set up a 30-month, interest-free  payment plan if needed.  When you are ready to remove the patch, follow instructions on the last 2 pages of Patient  Logbook. Stick patch monitor onto the last page of Patient Logbook.  Place Patient Logbook in the blue and white box. Use locking tab on box and tape box closed  securely. The blue and white box has prepaid postage on it. Please place it in the mailbox as  soon as possible. Your physician should have your test results approximately 7 days after the  monitor has been mailed back to [name].  Call [name] at [phone] if you have questions regarding  your ZIO XT patch monitor. Call them immediately if you see an orange light blinking on your  monitor.  If your monitor falls off in less than 4 days, contact our Monitor department at [phone].  If your monitor becomes loose or falls off after 4 days call [employer] at [phone] for  suggestions on securing your monitor    Your physician has requested that you have an echocardiogram. Echocardiography is a painless test that uses sound waves to create images of your heart. It provides your doctor with information about the size and shape of your heart and how well your heart's chambers and valves are working. This procedure takes approximately one hour. There are no restrictions for this procedure. Please do NOT wear cologne, perfume, aftershave, or lotions (deodorant is allowed). Please arrive 15 minutes prior to your appointment time.  Please note: We ask at that you not bring children with you during ultrasound (echo/  vascular) testing. Due to room size and safety concerns, children are not allowed in the ultrasound rooms during exams. Our front office staff cannot provide observation of children in our lobby area while testing is being conducted. An adult accompanying a patient to their appointment will only be allowed in the ultrasound room at the discretion of the ultrasound technician under  special circumstances. We apologize for any inconvenience.   Follow-Up: At [facility], you and your health needs are our priority.  As part of our continuing mission to provide you with exceptional heart care, our providers are all part of one team.  This team includes your primary Cardiologist (physician) and Advanced Practice Providers or APPs (Physician Assistants and Nurse Practitioners) who all work together to provide you with the care you need, when you need it.  Your next appointment:   3 month(s)  Provider:   Emeline FORBES Calender, MD or Kenzie Campbell, NP

## 2023-11-07 NOTE — Assessment & Plan Note (Signed)
 Lipid panel 10/28/2022: chol 136, HDL 43, LDL 49, triglycerides 288 LFTs: ALT 28 (10/28/2022), AST 32 (02/25/2021) Historical statin intoleranance Will order fasting lipids and CMP Continue Repatha  140 mg SQ inj x 2 weeks

## 2023-11-07 NOTE — Progress Notes (Unsigned)
 Applied a 7 day Zio XT monitor to patient in the office  Kriste to read

## 2023-11-08 ENCOUNTER — Ambulatory Visit: Payer: Self-pay | Admitting: Emergency Medicine

## 2023-11-08 LAB — LIPID PANEL
Chol/HDL Ratio: 2.2 ratio (ref 0.0–5.0)
Cholesterol, Total: 106 mg/dL (ref 100–199)
HDL: 49 mg/dL
LDL Chol Calc (NIH): 38 mg/dL (ref 0–99)
Triglycerides: 99 mg/dL (ref 0–149)
VLDL Cholesterol Cal: 19 mg/dL (ref 5–40)

## 2023-11-08 LAB — COMPREHENSIVE METABOLIC PANEL WITH GFR
ALT: 22 IU/L (ref 0–44)
AST: 29 IU/L (ref 0–40)
Albumin: 4.5 g/dL (ref 3.9–4.9)
Alkaline Phosphatase: 65 IU/L (ref 47–123)
BUN/Creatinine Ratio: 8 — ABNORMAL LOW (ref 10–24)
BUN: 8 mg/dL (ref 8–27)
Bilirubin Total: 0.4 mg/dL (ref 0.0–1.2)
CO2: 20 mmol/L (ref 20–29)
Calcium: 9.5 mg/dL (ref 8.6–10.2)
Chloride: 103 mmol/L (ref 96–106)
Creatinine, Ser: 0.96 mg/dL (ref 0.76–1.27)
Globulin, Total: 2 g/dL (ref 1.5–4.5)
Glucose: 87 mg/dL (ref 70–99)
Potassium: 4.8 mmol/L (ref 3.5–5.2)
Sodium: 139 mmol/L (ref 134–144)
Total Protein: 6.5 g/dL (ref 6.0–8.5)
eGFR: 89 mL/min/1.73

## 2023-11-08 LAB — CBC
Hematocrit: 44.6 % (ref 37.5–51.0)
Hemoglobin: 14.9 g/dL (ref 13.0–17.7)
MCH: 30.8 pg (ref 26.6–33.0)
MCHC: 33.4 g/dL (ref 31.5–35.7)
MCV: 92 fL (ref 79–97)
Platelets: 303 x10E3/uL (ref 150–450)
RBC: 4.83 x10E6/uL (ref 4.14–5.80)
RDW: 12.3 % (ref 11.6–15.4)
WBC: 6.4 x10E3/uL (ref 3.4–10.8)

## 2023-11-08 LAB — TSH: TSH: 1.41 u[IU]/mL (ref 0.450–4.500)

## 2023-11-14 DIAGNOSIS — E291 Testicular hypofunction: Secondary | ICD-10-CM | POA: Diagnosis not present

## 2023-11-14 DIAGNOSIS — G4700 Insomnia, unspecified: Secondary | ICD-10-CM | POA: Diagnosis not present

## 2023-11-14 DIAGNOSIS — Z23 Encounter for immunization: Secondary | ICD-10-CM | POA: Diagnosis not present

## 2023-11-14 DIAGNOSIS — E785 Hyperlipidemia, unspecified: Secondary | ICD-10-CM | POA: Diagnosis not present

## 2023-11-14 DIAGNOSIS — Z0000 Encounter for general adult medical examination without abnormal findings: Secondary | ICD-10-CM | POA: Diagnosis not present

## 2023-11-14 DIAGNOSIS — I2510 Atherosclerotic heart disease of native coronary artery without angina pectoris: Secondary | ICD-10-CM | POA: Diagnosis not present

## 2023-11-17 DIAGNOSIS — R002 Palpitations: Secondary | ICD-10-CM

## 2023-12-08 DIAGNOSIS — E669 Obesity, unspecified: Secondary | ICD-10-CM | POA: Diagnosis not present

## 2023-12-08 DIAGNOSIS — J069 Acute upper respiratory infection, unspecified: Secondary | ICD-10-CM | POA: Diagnosis not present

## 2023-12-08 DIAGNOSIS — E66811 Obesity, class 1: Secondary | ICD-10-CM | POA: Diagnosis not present

## 2023-12-08 DIAGNOSIS — Z6833 Body mass index (BMI) 33.0-33.9, adult: Secondary | ICD-10-CM | POA: Diagnosis not present

## 2023-12-17 ENCOUNTER — Other Ambulatory Visit: Payer: Self-pay | Admitting: Internal Medicine

## 2023-12-19 ENCOUNTER — Encounter: Payer: Self-pay | Admitting: Emergency Medicine

## 2023-12-19 ENCOUNTER — Ambulatory Visit (HOSPITAL_COMMUNITY)
Admission: RE | Admit: 2023-12-19 | Discharge: 2023-12-19 | Disposition: A | Source: Ambulatory Visit | Attending: Emergency Medicine

## 2023-12-19 DIAGNOSIS — I77810 Thoracic aortic ectasia: Secondary | ICD-10-CM

## 2023-12-19 DIAGNOSIS — I2510 Atherosclerotic heart disease of native coronary artery without angina pectoris: Secondary | ICD-10-CM | POA: Diagnosis not present

## 2023-12-19 DIAGNOSIS — R002 Palpitations: Secondary | ICD-10-CM | POA: Diagnosis not present

## 2023-12-19 DIAGNOSIS — E785 Hyperlipidemia, unspecified: Secondary | ICD-10-CM | POA: Diagnosis not present

## 2023-12-19 HISTORY — DX: Thoracic aortic ectasia: I77.810

## 2023-12-19 LAB — ECHOCARDIOGRAM COMPLETE
Area-P 1/2: 3.2 cm2
P 1/2 time: 565 ms
S' Lateral: 2.8 cm

## 2023-12-21 ENCOUNTER — Ambulatory Visit: Attending: Internal Medicine | Admitting: Internal Medicine

## 2023-12-21 ENCOUNTER — Encounter: Payer: Self-pay | Admitting: Internal Medicine

## 2023-12-21 ENCOUNTER — Telehealth: Payer: Self-pay | Admitting: Internal Medicine

## 2023-12-21 VITALS — BP 145/92 | HR 75 | Ht 70.5 in | Wt 242.0 lb

## 2023-12-21 DIAGNOSIS — R079 Chest pain, unspecified: Secondary | ICD-10-CM

## 2023-12-21 DIAGNOSIS — I10 Essential (primary) hypertension: Secondary | ICD-10-CM

## 2023-12-21 DIAGNOSIS — Z79899 Other long term (current) drug therapy: Secondary | ICD-10-CM

## 2023-12-21 DIAGNOSIS — I2510 Atherosclerotic heart disease of native coronary artery without angina pectoris: Secondary | ICD-10-CM | POA: Diagnosis not present

## 2023-12-21 DIAGNOSIS — Z9861 Coronary angioplasty status: Secondary | ICD-10-CM

## 2023-12-21 MED ORDER — LOSARTAN POTASSIUM 25 MG PO TABS: 25.0000 mg | ORAL_TABLET | Freq: Every day | ORAL | 3 refills | Status: AC

## 2023-12-21 MED ORDER — ISOSORBIDE MONONITRATE ER 60 MG PO TB24: 90.0000 mg | ORAL_TABLET | Freq: Every day | ORAL | 3 refills | Status: AC

## 2023-12-21 NOTE — Telephone Encounter (Addendum)
 Spoke with pt regarding blood pressure. States SBP ranges from 140's-120's. Has taken 120 mg a few times, as him and Dr. Alveta did at times.    Appointment made for today.

## 2023-12-21 NOTE — Progress Notes (Signed)
 Cardiology Office Note   Date:  12/21/2023  ID:  David Conner, DOB 1960/02/19, MRN [id number] PCP: David Senior, MD  [facility] Providers Cardiologist:  Emeline FORBES Calender, DO     History of Present Illness David Conner is a 63 y.o. male with past medical history of CAD and NSTEMI s/p PCI to mid LAD in 10/2017, persistent angina s/p DES to OM1 and DES to OM 2 11/2017, unable to tolerate Ranexa , GERD, hyperlipidemia who presents today for 71-month follow-up.  Notable cardiac history: 10/2017 was admitted to the hospital with chest pain and NSTEMI. LHC 11/13/2017 showed multivessel CAD, PCI to mid LAD 99% stenosis with 3.0 X18 DES resulting in 0% stenosis and TIMI-3 flow.  Moderate-severe OM disease, mild-moderate diffuse disease of nondominant RCA.  CTO of distal LAD and D1.  LCx not mentioned.  Aggressive secondary prevention recommended, uninterrupted DAPT with aspirin  and Brilinta  were recommended.   Echo 12/07/2017: LVEF 60-65%, mild LVH, normal systolic function, no RWMA, normal diastolic parameters, normal AV valve, normal ascending aorta, normal mitral valve, normal left atrium, normal RV, trivial tricuspid regurgitation.   Patient continued to have angina and was returned to the Cath Lab 12/12/2017 for stenting of OM1 and OM2. Ranexa  was poorly tolerated and stopped.     Further complaints of chest pain in 2023.  LHC 03/08/2021: Multivessel coronary artery disease, as detailed below, including CTO's of mid/distal LAD and D1, as well as 50-60% ostial OM1 (RFR = 0.97), and moderate-severe multifocal disease involving nondominant RCA. Compared to 2019, 99% distal LAD disease is now a chronic total occlusion.  Normal LVEDP with upper normal filling pressure of 15 mmHg.  No targets for intervention, medical therapy was recommended to be continued.  Ticagrelor  decreased to 60 mg twice daily given most recent PCI was over 12 months prior, recommended aggressive secondary  prevention.  He was most recently seen on 11/07/2023 by Miriam Shams, NP with complaints of palpitations but no chest pain.  A Zio patch and echo were ordered  Worsening left-sided chest pain over the past week.  Occurred firstly this weekend when putting up Christmas lights but this morning was able to walk about a mile and a half without any recurrent symptoms.  Has also noted increased blood pressure over the past week with systolics in the 140s. Occurs occasionally with rest and occasionally with exertion but not completely reproducible and not radiating.  Has increased anxiety due to his medical history and takes Xanax  which previously has helped his chronic pain but has not helped recently.  Because of this he increased his Imdur  on his own to 120 mg for the past 3 days and has been taking sublingual nitroglycerin  daily but has not had any improvement in his symptoms.  Feels like several extra heartbeats that can last for a few seconds.      ROS:  Review of Systems  All other systems reviewed and are negative.   Physical Exam  Physical Exam Vitals and nursing note reviewed.  Constitutional:      Appearance: Normal appearance.  HENT:     Head: Normocephalic and atraumatic.  Eyes:     Conjunctiva/sclera: Conjunctivae normal.  Cardiovascular:     Rate and Rhythm: Normal rate and regular rhythm.  Pulmonary:     Effort: Pulmonary effort is normal.     Breath sounds: Normal breath sounds.  Musculoskeletal:        General: No swelling or tenderness.  Skin:  Coloration: Skin is not jaundiced or pale.  Neurological:     Mental Status: He is alert.     VS:  BP (!) 145/92   Pulse 75   Ht 5' 10.5 (1.791 m)   Wt 242 lb (109.8 kg)   SpO2 97%   BMI 34.23 kg/m         Wt Readings from Last 3 Encounters:  12/21/23 242 lb (109.8 kg)  11/07/23 235 lb 9.6 oz (106.9 kg)  10/28/22 241 lb 9.6 oz (109.6 kg)     EKG Interpretation Date/Time:    Ventricular Rate:    PR  Interval:    QRS Duration:    QT Interval:    QTC Calculation:   R Axis:      Text Interpretation:      Studies Reviewed   Prior CV Studies: LONG TERM MONITOR (8-14 DAYS) INTERPRETATION 11/17/2023  Narrative   HR 60-179 bpm, average 83 bpm   Predominantly sinus rhythm   Occasional brief episodes of SVT   Rare ventricular and supraventricular ectopy   Triggered episodes mainly correspond to sinus rhythm   Emeline Calender, DO   Patch Wear Time:  9 days and 1 hours (2025-10-21T09:05:20-0400 to 2025-10-30T10:54:08-398)  Patient had a min HR of 60 bpm, max HR of 179 bpm, and avg HR of 83 bpm. Predominant underlying rhythm was Sinus Rhythm. 5 Supraventricular Tachycardia runs occurred, the run with the fastest interval lasting 9 beats with a max rate of 179 bpm, the longest lasting 15 beats with an avg rate of 110 bpm. Some episodes of Supraventricular Tachycardia may be possible Atrial Tachycardia with variable block. Supraventricular Tachycardia was detected within +/- 45 seconds of symptomatic patient event(s). Isolated SVEs were rare (<1.0%), SVE Couplets were rare (<1.0%), and SVE Triplets were rare (<1.0%). Isolated VEs were rare (<1.0%), and no VE Couplets or VE Triplets were present.    Echocardiogram 12/19/2023:   1. Left ventricular ejection fraction, by estimation, is 60 to 65%. The  left ventricle has normal function. The left ventricle has no regional  wall motion abnormalities. There is mild left ventricular hypertrophy.  Left ventricular diastolic parameters  were normal.   2. Right ventricular systolic function is normal. The right ventricular  size is normal.   3. The mitral valve is normal in structure. Trivial mitral valve  regurgitation. No evidence of mitral stenosis.   4. The aortic valve is tricuspid. Aortic valve regurgitation is mild. No  aortic stenosis is present.   5. Aortic dilatation noted. There is mild dilatation of the aortic root,  measuring 40 mm.  There is mild dilatation of the ascending aorta,  measuring 43 mm.   6. The inferior vena cava is normal in size with greater than 50%  respiratory variability, suggesting right atrial pressure of 3 mmHg.   LHC 03/08/21: Conclusions: Multivessel coronary artery disease, as detailed below, including CTO's of mid/distal LAD and D1, as well as 50-60% ostial OM1 (RFR = 0.97), and moderate-severe multifocal disease involving nondominant RCA.  Compared to 2019, 99% distal LAD disease is now a chronic total occlusion.  Risk Assessment/Calculations    ASCVD risk score: The ASCVD Risk score (Arnett DK, et al., 2019) failed to calculate for the following reasons:   Risk score cannot be calculated because patient has a medical history suggesting prior/existing ASCVD   ASSESSMENT  Atypical chest pain, possibly cardiac with extensive cardiac history chest pain occurred this weekend on exertion but also occasionally occurs with rest  and is not reproducible with every exertional episode.  Was able to walk this morning and exert himself without reproduction of symptoms.  Described as left-sided discomfort with extra heartbeats.  No improvement despite increasing Imdur  to 120 mg and taking sublingual nitroglycerin . Symptoms are more consistent with possible PVCs and/or hypertension versus less likely CAD however given his extensive history it is difficult to rule out progression of CAD.   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  Likely due to longstanding hypertension.  He is a non-smoker and BP has been consistently greater than 140/90. Hypertension BP 145/92.  Currently on Imdur  120 mg (he personally increased from 90 milligrams to 120 mg), metoprolol  succinate 100 mg twice daily CAD and NSTEMI s/p PCI to mid LAD in 10/2017, persistent angina s/p DES to OM1 and DES to OM2 11/2017, now CTO of mid/distal LAD and D1, as well as 50-60% ostial OM1, unable to tolerate  Ranexa .  On aspirin  and Brilinta  60 mg twice daily GERD Hyperlipidemia on Repatha    Plan  Given the atypical nature of the chest pain we will pursue a cardiac PET/CT and evaluate for any high risk findings Patient advised to decrease Imdur  to 90 mg as previously prescribed since he has not had any improvement with this increased dosing.   Will start on losartan 25 mg with strict goal BP less than 130/80 in the setting of dilated aorta and aortic root.  BMP in 2 weeks with blood pressure log via MyChart If needed, can also add on amlodipine for an antihypertensive which has an antianginal component Repeat echocardiogram in 1 year to follow-up on the aorta Goal LDL less than 55  Follow up: 4 to 6 weeks     Informed Consent   Shared Decision Making/Informed Consent The risks [chest pain, shortness of breath, cardiac arrhythmias, dizziness, blood pressure fluctuations, myocardial infarction, stroke/transient ischemic attack, nausea, vomiting, allergic reaction, radiation exposure, metallic taste sensation and life-threatening complications (estimated to be 1 in 10,000)], benefits (risk stratification, diagnosing coronary artery disease, treatment guidance) and alternatives of a cardiac PET stress test were discussed in detail with Mr. Hunkins and he agrees to proceed.       Signed, Emeline FORBES Calender, DO

## 2023-12-21 NOTE — Telephone Encounter (Signed)
 Pt c/o BP issue: STAT if pt c/o blurred vision, one-sided weakness or slurred speech.  STAT if BP is GREATER than 180/120 TODAY.  STAT if BP is LESS than 90/60 and SYMPTOMATIC TODAY  1. What is your BP concern? Hypertension  2. Have you taken any BP medication today? yes   3. What are your last 5 BP readings? 145/95, 120/80 (am)   4. Are you having any other symptoms (ex. Dizziness, headache, blurred vision, passed out)? No

## 2023-12-21 NOTE — Patient Instructions (Signed)
 Medication Instructions:  CONTINUE Imdur  at 90 mg daily by mouth. Take 1.5 tablets by mouth once daily  Losartan 25 mg once daily by mouth  *If you need a refill on your cardiac medications before your next appointment, please call your pharmacy*  Lab Work: BMET in 2 weeks  If you have labs (blood work) drawn today and your tests are completely normal, you will receive your results only by: MyChart Message (if you have MyChart) OR A paper copy in the mail If you have any lab test that is abnormal or we need to change your treatment, we will call you to review the results.  Testing/Procedures: Cardiac PET/CT Stress Test:  1. Please do not take these medications before your test:  ~Medications that may interfere with the cardiac pharmacological stress agent (ex. nitrates - including erectile dysfunction medications, isosorbide  mononitrate, tamulosin or beta-blockers) the day of the exam. (Erectile dysfunction medication should be held for at least 72 hrs prior to test) ~Theophylline containing medications for 12 hours. ~Dipyridamole 48 hours prior to the test. ~Your remaining medications may be taken with water.  2. Nothing to eat or drink, except water, 3 hours prior to arrival time.   ~ NO caffeine/decaffeinated products, or chocolate 12 hours prior to arrival.  3. NO perfume, cologne or lotion on chest or abdomen area.  4. Total time is 1 to 2 hours; you may want to bring reading material for the waiting time.  Please report to Radiology at the [facility] Main Entrance 30 minutes early for your test. [address] [place], [place] [address]  OR  Please report to Radiology at [facility] Main Entrance, medical mall, 30 mins prior to your test. [address] [place], [place] [phone]  Diabetic Preparation: - Hold oral medications. - You may take NPH and Lantus insulin. - Do not take Humalog or Humulin R (Regular Insulin) the  day of your test. - Check blood sugars prior to leaving the house. - If able to eat breakfast prior to 3 hour fasting, you may take all medications, including your insulin, - Do not worry if you miss your breakfast dose of insulin - start at your next meal. - Patients who wear a continuous glucose monitor MUST remove the device prior to scanning.  In preparation for your appointment, medication and supplies will be purchased.  Appointment availability is limited, so if you need to cancel or reschedule, please call the Radiology Department at [phone] [facility]) OR [phone] [facility])  24 hours in advance to avoid a cancellation fee of $100.00  What to Expect After you Arrive:  Once you arrive and check in for your appointment, you will be taken to a preparation room within the Radiology Department.  A technologist or Nurse will obtain your medical history, verify that you are correctly prepped for the exam, and explain the procedure.  Afterwards,  an IV will be started in your arm and electrodes will be placed on your skin for EKG monitoring during the stress portion of the exam. Then you will be escorted to the PET/CT scanner.  There, staff will get you positioned on the scanner and obtain a blood pressure and EKG.  During the exam, you will continue to be connected to the EKG and blood pressure machines.  A small, safe amount of a radioactive tracer will be injected in your IV to obtain a series of pictures of your heart along with an injection of a stress  agent.    After your Exam:  It is recommended that you eat a meal and drink a caffeinated beverage to counter act any effects of the stress agent.  Drink plenty of fluids for the remainder of the day and urinate frequently for the first couple of hours after the exam.  Your doctor will inform you of your test results within 7-10 business days.  For more information and frequently asked questions, please visit our website :  [email or web address]  For questions about your test or how to prepare for your test, please call: Cardiac Imaging Nurse Navigators Office: [phone]    Follow-Up: At [facility], you and your health needs are our priority.  As part of our continuing mission to provide you with exceptional heart care, our providers are all part of one team.  This team includes your primary Cardiologist (physician) and Advanced Practice Providers or APPs (Physician Assistants and Nurse Practitioners) who all work together to provide you with the care you need, when you need it.  Your next appointment:   4-6 week(s)  Provider:   Emeline FORBES Calender, DO    We recommend signing up for the patient portal called MyChart.  Sign up information is provided on this After Visit Summary.  MyChart is used to connect with patients for Virtual Visits (Telemedicine).  Patients are able to view lab/test results, encounter notes, upcoming appointments, etc.  Non-urgent messages can be sent to your provider as well.   To learn more about what you can do with MyChart, go to [email or web address].   Other Instructions Blood Pressure Record Sheet To take your blood pressure, you will need a blood pressure machine. You can buy a blood pressure machine (blood pressure monitor) at your clinic, drug store, or online. When choosing one, consider: An automatic monitor that has an arm cuff. A cuff that wraps snugly around your upper arm. You should be able to fit only one finger between your arm and the cuff. A device that stores blood pressure reading results. Do not choose a monitor that measures your blood pressure from your wrist or finger. Follow your health care provider's instructions for how to take your blood pressure. To use this form: Take your blood pressure medications every day These measurements should be taken when you have been at rest for at least 10-15 min Take at least 2 readings with each  blood pressure check. This makes sure the results are correct. Wait 1-2 minutes between measurements. Write down the results in the spaces on this form. Keep in mind it should always be recorded systolic over diastolic. Both numbers are important.  Repeat this every day for 2-3 weeks, or as told by your health care provider.  Make a follow-up appointment with your health care provider to discuss the results.  Blood Pressure Log Date Medications taken? (Y/N) Blood Pressure Time of Day

## 2023-12-25 ENCOUNTER — Telehealth (HOSPITAL_COMMUNITY): Payer: Self-pay | Admitting: *Deleted

## 2023-12-25 NOTE — Telephone Encounter (Signed)
 Reaching out to patient to offer assistance regarding upcoming cardiac imaging study; pt verbalizes understanding of appt date/time, parking situation and where to check in, pre-test NPO status; name and call back number provided for further questions should they arise  Chantal Requena RN Navigator Cardiac Imaging [name] [phone] office [phone] cell  Patient aware to avoid Imdur  and caffeine for his cardiac PET scan.

## 2023-12-26 ENCOUNTER — Ambulatory Visit (HOSPITAL_COMMUNITY)
Admission: RE | Admit: 2023-12-26 | Discharge: 2023-12-26 | Disposition: A | Source: Ambulatory Visit | Attending: Internal Medicine

## 2023-12-26 DIAGNOSIS — R079 Chest pain, unspecified: Secondary | ICD-10-CM

## 2023-12-26 LAB — NM PET CT CARDIAC PERFUSION MULTI W/ABSOLUTE BLOODFLOW
LV dias vol: 92 mL (ref 62–150)
LV sys vol: 40 mL (ref 4.2–5.8)
MBFR: 2.32
Nuc Rest EF: 57 %
Nuc Stress EF: 61 %
Rest MBF: 0.84 ml/g/min
Rest Nuclear Isotope Dose: 28.7 mCi
ST Depression (mm): 0 mm
Stress MBF: 1.95 ml/g/min
Stress Nuclear Isotope Dose: 28.4 mCi

## 2023-12-26 MED ORDER — RUBIDIUM RB82 GENERATOR (RUBYFILL)
28.3600 | PACK | Freq: Once | INTRAVENOUS | Status: AC
  Administered 2023-12-26: 28.36 via INTRAVENOUS

## 2023-12-26 MED ORDER — REGADENOSON 0.4 MG/5ML IV SOLN
INTRAVENOUS | Status: AC
  Filled 2023-12-26: qty 5

## 2023-12-26 MED ORDER — REGADENOSON 0.4 MG/5ML IV SOLN
0.4000 mg | Freq: Once | INTRAVENOUS | Status: AC
  Administered 2023-12-26: 0.4 mg via INTRAVENOUS

## 2023-12-26 MED ORDER — RUBIDIUM RB82 GENERATOR (RUBYFILL)
28.7400 | PACK | Freq: Once | INTRAVENOUS | Status: AC
  Administered 2023-12-26: 28.74 via INTRAVENOUS

## 2023-12-27 ENCOUNTER — Ambulatory Visit: Payer: Self-pay | Admitting: Internal Medicine

## 2023-12-27 DIAGNOSIS — I77810 Thoracic aortic ectasia: Secondary | ICD-10-CM

## 2023-12-27 DIAGNOSIS — R079 Chest pain, unspecified: Secondary | ICD-10-CM

## 2023-12-27 NOTE — H&P (View-Only) (Signed)
 I called the patient to discuss the findings of his recent PET scan.  His cardiac PET/CT shows high risk findings concerning for severe reversible defect in the LAD territory.  I have reviewed the images myself and agree with these findings.  Since starting losartan  he has not had any recurrent chest pain.  Given these findings I advised him not to go on his anticipated trip to [place]  next week until he goes for a left heart catheterization.  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  Nearly since starting the losartan  his blood pressures have been well-controlled with systolics in the 110s to 120s distantly.  Please schedule the patient for a left heart catheterization.  Shared Decision Making/Informed Consent The risks [stroke (1 in 1000), death (1 in 1000), kidney failure [usually temporary] (1 in 500), bleeding (1 in 200), allergic reaction [possibly serious] (1 in 200)], benefits (diagnostic support and management of coronary artery disease) and alternatives of a cardiac catheterization were discussed in detail with Mr. Avery and he is willing to proceed. Please schedule the patient for a CTA of the aorta I advised him on chest pain precautions  Thank you, Emeline Calender, DO

## 2023-12-27 NOTE — Progress Notes (Signed)
 I called the patient to discuss the findings of his recent PET scan.  His cardiac PET/CT shows high risk findings concerning for severe reversible defect in the LAD territory.  I have reviewed the images myself and agree with these findings.  Since starting losartan  he has not had any recurrent chest pain.  Given these findings I advised him not to go on his anticipated trip to [place]  next week until he goes for a left heart catheterization.  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  Nearly since starting the losartan  his blood pressures have been well-controlled with systolics in the 110s to 120s distantly.  Please schedule the patient for a left heart catheterization.  Shared Decision Making/Informed Consent The risks [stroke (1 in 1000), death (1 in 1000), kidney failure [usually temporary] (1 in 500), bleeding (1 in 200), allergic reaction [possibly serious] (1 in 200)], benefits (diagnostic support and management of coronary artery disease) and alternatives of a cardiac catheterization were discussed in detail with David Conner and he is willing to proceed. Please schedule the patient for a CTA of the aorta I advised him on chest pain precautions  Thank you, Emeline Calender, DO

## 2023-12-29 ENCOUNTER — Ambulatory Visit (HOSPITAL_COMMUNITY)
Admission: RE | Admit: 2023-12-29 | Discharge: 2023-12-29 | Disposition: A | Source: Ambulatory Visit | Attending: Internal Medicine

## 2023-12-29 DIAGNOSIS — R079 Chest pain, unspecified: Secondary | ICD-10-CM | POA: Diagnosis not present

## 2023-12-29 DIAGNOSIS — I77810 Thoracic aortic ectasia: Secondary | ICD-10-CM

## 2023-12-29 DIAGNOSIS — I7121 Aneurysm of the ascending aorta, without rupture: Secondary | ICD-10-CM | POA: Diagnosis not present

## 2023-12-29 DIAGNOSIS — I2510 Atherosclerotic heart disease of native coronary artery without angina pectoris: Secondary | ICD-10-CM | POA: Diagnosis not present

## 2023-12-29 MED ORDER — IOHEXOL 350 MG/ML SOLN
75.0000 mL | Freq: Once | INTRAVENOUS | Status: AC | PRN
  Administered 2023-12-29: 75 mL via INTRAVENOUS

## 2023-12-30 LAB — CBC
Hematocrit: 45.8 % (ref 37.5–51.0)
Hemoglobin: 15.3 g/dL (ref 13.0–17.7)
MCH: 30.8 pg (ref 26.6–33.0)
MCHC: 33.4 g/dL (ref 31.5–35.7)
MCV: 92 fL (ref 79–97)
Platelets: 325 x10E3/uL (ref 150–450)
RBC: 4.97 x10E6/uL (ref 4.14–5.80)
RDW: 12.2 % (ref 11.6–15.4)
WBC: 7.1 x10E3/uL (ref 3.4–10.8)

## 2023-12-30 LAB — BASIC METABOLIC PANEL WITH GFR
BUN/Creatinine Ratio: 14 (ref 10–24)
BUN: 13 mg/dL (ref 8–27)
CO2: 23 mmol/L (ref 20–29)
Calcium: 10.1 mg/dL (ref 8.6–10.2)
Chloride: 102 mmol/L (ref 96–106)
Creatinine, Ser: 0.93 mg/dL (ref 0.76–1.27)
Glucose: 103 mg/dL — AB (ref 70–99)
Potassium: 4.8 mmol/L (ref 3.5–5.2)
Sodium: 140 mmol/L (ref 134–144)
eGFR: 92 mL/min/1.73 (ref >59)

## 2024-01-03 ENCOUNTER — Encounter: Payer: Self-pay | Admitting: Internal Medicine

## 2024-01-03 ENCOUNTER — Telehealth: Payer: Self-pay | Admitting: *Deleted

## 2024-01-03 NOTE — Telephone Encounter (Signed)
 Cardiac Catheterization scheduled at [facility] for: Friday January 05, 2024 10:30 AM Arrival time [facility] Main Entrance A at: 8:30 AM  Diet: -Nothing to eat after midnight.  Hydration: -May drink clear liquids until 2 hours before the procedure.  Approved liquids: Water, clear tea, black coffee, fruit juices-non-citric and without pulp,Gatorade, plain Jello/popsicles.   -Please drink 16 oz of water 2 hours before procedure.  Medication instructions: -Usual morning medications can be taken including aspirin  81 mg and Brilinta  60 mg  Plan to go home the same day, you will only stay overnight if medically necessary.  You must have responsible adult to drive you home.  Someone must be with you the first 24 hours after you arrive home.  Reviewed procedure instructions with patient.

## 2024-01-05 ENCOUNTER — Other Ambulatory Visit: Payer: Self-pay

## 2024-01-05 ENCOUNTER — Encounter (HOSPITAL_COMMUNITY): Admission: RE | Disposition: A | Payer: Self-pay | Attending: Cardiology

## 2024-01-05 ENCOUNTER — Ambulatory Visit (HOSPITAL_COMMUNITY)
Admission: RE | Admit: 2024-01-05 | Discharge: 2024-01-05 | Disposition: A | Discharge status: Home / Self Care | Attending: Cardiology | Admitting: Cardiology

## 2024-01-05 ENCOUNTER — Encounter: Payer: Self-pay | Admitting: Internal Medicine

## 2024-01-05 DIAGNOSIS — I252 Old myocardial infarction: Secondary | ICD-10-CM | POA: Diagnosis not present

## 2024-01-05 DIAGNOSIS — Z7902 Long term (current) use of antithrombotics/antiplatelets: Secondary | ICD-10-CM | POA: Insufficient documentation

## 2024-01-05 DIAGNOSIS — Z79899 Other long term (current) drug therapy: Secondary | ICD-10-CM | POA: Insufficient documentation

## 2024-01-05 DIAGNOSIS — Z955 Presence of coronary angioplasty implant and graft: Secondary | ICD-10-CM | POA: Insufficient documentation

## 2024-01-05 DIAGNOSIS — K219 Gastro-esophageal reflux disease without esophagitis: Secondary | ICD-10-CM | POA: Insufficient documentation

## 2024-01-05 DIAGNOSIS — Z7982 Long term (current) use of aspirin: Secondary | ICD-10-CM | POA: Diagnosis not present

## 2024-01-05 DIAGNOSIS — E785 Hyperlipidemia, unspecified: Secondary | ICD-10-CM | POA: Insufficient documentation

## 2024-01-05 DIAGNOSIS — R079 Chest pain, unspecified: Secondary | ICD-10-CM

## 2024-01-05 DIAGNOSIS — I2582 Chronic total occlusion of coronary artery: Secondary | ICD-10-CM | POA: Insufficient documentation

## 2024-01-05 DIAGNOSIS — I25118 Atherosclerotic heart disease of native coronary artery with other forms of angina pectoris: Secondary | ICD-10-CM | POA: Diagnosis not present

## 2024-01-05 HISTORY — PX: LEFT HEART CATH AND CORONARY ANGIOGRAPHY: CATH118249

## 2024-01-05 SURGERY — LEFT HEART CATH AND CORONARY ANGIOGRAPHY
Anesthesia: LOCAL

## 2024-01-05 MED ORDER — FENTANYL CITRATE (PF) 100 MCG/2ML IJ SOLN
INTRAMUSCULAR | Status: AC
  Filled 2024-01-05: qty 2

## 2024-01-05 MED ORDER — VERAPAMIL HCL 2.5 MG/ML IV SOLN
INTRAVENOUS | Status: AC
  Filled 2024-01-05: qty 2

## 2024-01-05 MED ORDER — SODIUM CHLORIDE 0.9 % IV SOLN: 250.0000 mL | INTRAVENOUS | Status: DC | PRN

## 2024-01-05 MED ORDER — MIDAZOLAM HCL (PF) 2 MG/2ML IJ SOLN
INTRAMUSCULAR | Status: DC | PRN
  Administered 2024-01-05: 1 mg via INTRAVENOUS

## 2024-01-05 MED ORDER — ACETAMINOPHEN 325 MG PO TABS: 650.0000 mg | ORAL_TABLET | ORAL | Status: DC | PRN

## 2024-01-05 MED ORDER — VERAPAMIL HCL 2.5 MG/ML IV SOLN
INTRAVENOUS | Status: DC | PRN
  Administered 2024-01-05: 10 mL via INTRA_ARTERIAL

## 2024-01-05 MED ORDER — SODIUM CHLORIDE 0.9% FLUSH: 3.0000 mL | Freq: Two times a day (BID) | INTRAVENOUS | Status: DC

## 2024-01-05 MED ORDER — HEPARIN (PORCINE) IN NACL 2000-0.9 UNIT/L-% IV SOLN
INTRAVENOUS | Status: DC | PRN
  Administered 2024-01-05: 1000 mL

## 2024-01-05 MED ORDER — LIDOCAINE HCL (PF) 1 % IJ SOLN
INTRAMUSCULAR | Status: DC | PRN
  Administered 2024-01-05: 2 mL via INTRADERMAL

## 2024-01-05 MED ORDER — FREE WATER: 500.0000 mL | Freq: Once | Status: DC

## 2024-01-05 MED ORDER — LIDOCAINE HCL (PF) 1 % IJ SOLN
INTRAMUSCULAR | Status: AC
  Filled 2024-01-05: qty 30

## 2024-01-05 MED ORDER — SODIUM CHLORIDE 0.9% FLUSH: 3.0000 mL | INTRAVENOUS | Status: DC | PRN

## 2024-01-05 MED ORDER — HEPARIN SODIUM (PORCINE) 1000 UNIT/ML IJ SOLN
INTRAMUSCULAR | Status: DC | PRN
  Administered 2024-01-05: 5000 [IU] via INTRAVENOUS

## 2024-01-05 MED ORDER — HYDRALAZINE HCL 20 MG/ML IJ SOLN: 10.0000 mg | INTRAMUSCULAR | Status: DC | PRN

## 2024-01-05 MED ORDER — MIDAZOLAM HCL 2 MG/2ML IJ SOLN
INTRAMUSCULAR | Status: AC
  Filled 2024-01-05: qty 2

## 2024-01-05 MED ORDER — ONDANSETRON HCL 4 MG/2ML IJ SOLN: 4.0000 mg | Freq: Four times a day (QID) | INTRAMUSCULAR | Status: DC | PRN

## 2024-01-05 MED ORDER — FENTANYL CITRATE (PF) 100 MCG/2ML IJ SOLN
INTRAMUSCULAR | Status: DC | PRN
  Administered 2024-01-05: 25 ug via INTRAVENOUS

## 2024-01-05 MED ORDER — HEPARIN SODIUM (PORCINE) 1000 UNIT/ML IJ SOLN
INTRAMUSCULAR | Status: AC
  Filled 2024-01-05: qty 10

## 2024-01-05 MED ORDER — IOHEXOL 350 MG/ML SOLN
INTRAVENOUS | Status: DC | PRN
  Administered 2024-01-05: 55 mL

## 2024-01-05 MED ORDER — ASPIRIN 81 MG PO CHEW: 81.0000 mg | CHEWABLE_TABLET | ORAL | Status: DC

## 2024-01-05 SURGICAL SUPPLY — 8 items
CATH 5FR JL3.5 JR4 ANG PIG MP (CATHETERS) IMPLANT
CATH INFINITI 6F AL1 (CATHETERS) IMPLANT
DEVICE RAD COMP TR BAND LRG (VASCULAR PRODUCTS) IMPLANT
GLIDESHEATH SLEND SS 6F .021 (SHEATH) IMPLANT
GUIDEWIRE INQWIRE 1.5J.035X260 (WIRE) IMPLANT
PACK CARDIAC CATHETERIZATION (CUSTOM PROCEDURE TRAY) ×1 IMPLANT
SET ATX-X65L (MISCELLANEOUS) IMPLANT
SHEATH PROBE COVER 6X72 (BAG) IMPLANT

## 2024-01-05 NOTE — Progress Notes (Signed)
 Discharge instructions reviewed with patient and wife at bedside. Denies questions concerns. PT tolerated PO intake. Ambulated in the hallway. Incision site remains clean dry and intact. No s/s of complications.

## 2024-01-05 NOTE — Interval H&P Note (Signed)
 History and Physical Interval Note:  01/05/2024 9:22 AM  Bertran G Pelissier  has presented today for surgery, with the diagnosis of abnormal coronary pet scan.  The various methods of treatment have been discussed with the patient and family. After consideration of risks, benefits and other options for treatment, the patient has consented to  Procedures: LEFT HEART CATH AND CORONARY ANGIOGRAPHY (N/A) as a surgical intervention.  The patient's history has been reviewed, patient examined, no change in status, stable for surgery.  I have reviewed the patient's chart and labs.  Questions were answered to the patient's satisfaction.    Cath Lab Visit (complete for each Cath Lab visit)  Clinical Evaluation Leading to the Procedure:   ACS: No.  Non-ACS:    Anginal Classification: CCS II  Anti-ischemic medical therapy: Maximal Therapy (2 or more classes of medications)  Non-Invasive Test Results: High-risk stress test findings: cardiac mortality >3%/year  Prior CABG: No previous CABG       Maude [facility] 01/05/2024 9:22 AM

## 2024-01-05 NOTE — Discharge Instructions (Addendum)
 Radial Site Care The following information offers guidance on how to care for yourself after your procedure. Your health care provider may also give you more specific instructions. If you have problems or questions, contact your health care provider. What can I expect after the procedure? After the procedure, it is common to have bruising and tenderness in the incision area. Follow these instructions at home: Incision site care  Follow instructions from your health care provider about how to take care of your incision site. Make sure you: Wash your hands with soap and water for at least 20 seconds before and after you change your bandage (dressing). If soap and water are not available, use hand sanitizer. Remove your dressing in 24 hours. Leave stitches (sutures), skin glue, or adhesive strips in place. These skin closures may need to stay in place for 2 weeks or longer. If adhesive strip edges start to loosen and curl up, you may trim the loose edges. Do not remove adhesive strips completely unless your health care provider tells you to do that. Do not take baths, swim, or use a hot tub for at least 1 week. You may shower 24 hours after the procedure or as told by your health care provider. Remove the dressing and gently wash the incision area with plain soap and water. Pat the area dry with a clean towel. Do not rub the site. That could cause bleeding. Do not apply powder or lotion to the site. Check your incision site every day for signs of infection. Check for: Redness, swelling, or pain. Fluid or blood. Warmth. Pus or a bad smell. Activity For 24 hours after the procedure, or as directed by your health care provider: Do not flex or bend the affected arm. Do not push or pull heavy objects with the affected arm. Do not operate machinery or power tools. Do not drive. You should not drive yourself home from the hospital or clinic if you go home during that time period. You may drive 24  hours after the procedure unless your health care provider tells you not to. Do not lift anything that is heavier than 10 lb (4.5 kg), or the limit that you are told, until your health care provider says that it is safe. Return to your normal activities as told by your health care provider. Ask your health care provider what activities are safe for you and when you can return to work. If you were given a sedative during the procedure, it can affect you for several hours. Do not drive or operate machinery until your health care provider says that it is safe. General instructions Take over-the-counter and prescription medicines only as told by your health care provider. If you will be going home right after the procedure, plan to have a responsible adult care for you for the time you are told. This is important. Keep all follow-up visits. This is important. Contact a health care provider if: You have a fever or chills. You have any of these signs of infection at your incision site: Redness, swelling, or pain. Fluid or blood. Warmth. Pus or a bad smell. Get help right away if: The incision area swells very fast. The incision area is bleeding, and the bleeding does not stop when you hold steady pressure on the area. Your arm or hand becomes pale, cool, tingly, or numb. These symptoms may represent a serious problem that is an emergency. Do not wait to see if the symptoms will go away. Get medical  help right away. Call your local emergency services (911 in the [place] Do not drive yourself to the hospital. Summary After the procedure, it is common to have bruising and tenderness at the incision site. Follow instructions from your health care provider about how to take care of your radial site incision. Check the incision every day for signs of infection. Do not lift anything that is heavier than 10 lb (4.5 kg), or the limit that you are told, until your health care provider says that it is  safe. Get help right away if the incision area swells very fast, you have bleeding at the incision site that will not stop, or your arm or hand becomes pale, cool, or numb. This information is not intended to replace advice given to you by your health care provider. Make sure you discuss any questions you have with your health care provider. Document Revised: 02/23/2020 Document Reviewed: 02/23/2020 Elsevier Patient Education  2024 [employer].

## 2024-01-07 ENCOUNTER — Encounter (HOSPITAL_COMMUNITY): Payer: Self-pay | Admitting: Cardiology

## 2024-01-08 DIAGNOSIS — F4321 Adjustment disorder with depressed mood: Secondary | ICD-10-CM | POA: Diagnosis not present

## 2024-01-08 DIAGNOSIS — F411 Generalized anxiety disorder: Secondary | ICD-10-CM | POA: Diagnosis not present

## 2024-01-15 DIAGNOSIS — R197 Diarrhea, unspecified: Secondary | ICD-10-CM | POA: Diagnosis not present

## 2024-01-15 DIAGNOSIS — R059 Cough, unspecified: Secondary | ICD-10-CM | POA: Diagnosis not present

## 2024-01-15 DIAGNOSIS — R519 Headache, unspecified: Secondary | ICD-10-CM | POA: Diagnosis not present

## 2024-01-15 DIAGNOSIS — R0989 Other specified symptoms and signs involving the circulatory and respiratory systems: Secondary | ICD-10-CM | POA: Diagnosis not present

## 2024-01-15 DIAGNOSIS — R051 Acute cough: Secondary | ICD-10-CM | POA: Diagnosis not present

## 2024-01-15 DIAGNOSIS — H6123 Impacted cerumen, bilateral: Secondary | ICD-10-CM | POA: Diagnosis not present

## 2024-01-23 ENCOUNTER — Ambulatory Visit: Admitting: Internal Medicine

## 2024-02-07 ENCOUNTER — Encounter: Payer: Self-pay | Admitting: *Deleted

## 2024-02-07 NOTE — Progress Notes (Unsigned)
 " Cardiology Office Note:  .   Date:  02/07/2024  ID:  David Conner, DOB 1960/06/25, MRN [id number] PCP: Nanci Senior, MD  [facility] Providers Cardiologist:  Emeline FORBES Calender, DO { Click to update primary MD,subspecialty MD or APP then REFRESH:1}   History of Present Illness: .     Discussed the use of AI scribe software for clinical note transcription with the patient, who gave verbal consent to proceed.  History of Present Illness David Conner is a 64 year old male with coronary artery disease and history of NSTEMI who presents for follow-up of persistent chest pain.  Chest pain and ischemic heart disease - Coronary artery disease with prior non-ST elevation myocardial infarction. - Percutaneous coronary intervention to mid left anterior descending artery in 2019. - Drug-eluting stents placed in obtuse marginal 1 and obtuse marginal 2 for persistent angina. 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 - Persistent chest pain evaluated in December 2025; pain described as atypical. - PET CT on December 26, 2023 showed abnormal fusion with ischemia and severe reduction in uptake in apical to mid anterior, anterolateral, and anterior septal locations, reversible. - Left heart catheterization on January 05, 2024 revealed single vessel obstructive coronary artery disease and known chronic total occlusion over mid to distal left anterior descending artery and first diagonal. - Stents in proximal left anterior descending artery, obtuse marginal 1, and obtuse marginal 2 remain patent without angiographic changes compared to February 2023. - Mildly elevated left ventricular end-diastolic pressure at 18 mmHg. - No targets for revascularization identified at last catheterization. - Unable to tolerate Ranexa  in the past. - No current chest pain.  Hypertension - Blood pressure generally well-controlled in mornings, around 104/68  mmHg. - Afternoon blood pressure rises up to 146/87 mmHg. - Takes losartan  25 mg. - Occasional slower urinary flow attributed to antihypertensive medication and enlarged prostate.  Anxiety and sleep disturbance - Occasional 'tingle' or 'buzzing feeling' at night, associated with anxiety. - Uses Xanax  intermittently for relief. - Wakes up with headaches occasionally.  Sleep apnea risk - No diagnosis of sleep apnea; sleep study performed ten years ago. - Heavy snoring and daytime fatigue. - High blood pressure and 18-inch collar size contribute to high STOP-BANG score for sleep apnea risk.  Recent respiratory illness - Recent episode of chest congestion and flu-like symptoms. - Symptoms improved with over-the-counter medications and resolved during trip to [place] .       ***{There is no content from the last Narrative History section.}    ROS: Remaining review of systems negative  Studies Reviewed: .        Results Radiology Cardiac PET CT (12/26/2023): Abnormal perfusion with reversible ischemia and severe reduction in uptake in apical to mid anterior, anterolateral, and anterior septal segments  Diagnostic Left heart catheterization (01/05/2024): Single vessel obstructive coronary artery disease; chronic total occlusion of mid to distal LAD and first diagonal; patent stents in proximal LAD, OM1, and OM2 without angiographic change compared to February 2023; mildly elevated LVEDP at 18 mmHg; no targets for revascularization Echocardiogram: Ascending aorta diameter 43 mm; aortic root diameter 40 mm Risk Assessment/Calculations:   {Does this patient have ATRIAL FIBRILLATION?:[phone]} No BP recorded.  {Refresh Note OR Click here to enter BP  :1}***       Physical Exam:   VS:  There were no vitals taken for this visit.   Wt Readings from Last 3 Encounters:  01/05/24 240 lb (  108.9 kg)  12/21/23 242 lb (109.8 kg)  11/07/23 235 lb 9.6 oz (106.9 kg)    GEN: Well  nourished, well developed in no acute distress NECK: No JVD; No carotid bruits CARDIAC: ***RRR, no murmurs, no rubs, no gallops RESPIRATORY:  Clear to auscultation without rales, wheezing or rhonchi  ABDOMEN: Soft, non-tender, non-distended EXTREMITIES:  No edema; No deformity   ASSESSMENT AND PLAN: .    Assessment and Plan Assessment & Plan Coronary artery disease with chronic total occlusion and prior PCI Chronic total occlusion of distal LAD and D1 with prior PCI to mid LAD. Persistent angina post DES to OM1 and OM2. Recent PET CT showed reversible ischemia. Left heart catheterization showed single vessel obstructive CAD with known CTO and patent stents. Current chest pain likely anxiety-related. - Continue current management as no revascularization targets identified.  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 No acute changes or interventions required.  Hypertension Blood pressure generally well-controlled with losartan , slightly elevated in afternoons. Anxiety may contribute to elevated readings. Losartan  may cause urinary flow issues. - Continue current antihypertensive regimen with losartan . - Monitor blood pressure regularly.  Hyperlipidemia Current management with Repatha . - Continue Repatha  for hyperlipidemia management.  Suspected obstructive sleep apnea High STOP-BANG score indicating high risk. Symptoms include snoring, daytime fatigue, and morning headaches. May contribute to elevated blood pressure and morning headaches. - Ordered sleep study to evaluate for obstructive sleep apnea.       {Are you ordering a CV Procedure (e.g. stress test, cath, DCCV, TEE, etc)?   Press F2        :[id number]}    Follow up: ***  Signed, Emeline FORBES Calender, DO  02/07/2024 8:08 PM    [facility] "

## 2024-02-08 ENCOUNTER — Encounter: Payer: Self-pay | Admitting: Internal Medicine

## 2024-02-08 ENCOUNTER — Ambulatory Visit: Attending: Cardiology | Admitting: Internal Medicine

## 2024-02-08 VITALS — BP 122/72 | HR 71 | Ht 70.0 in | Wt 250.7 lb

## 2024-02-08 DIAGNOSIS — Z9861 Coronary angioplasty status: Secondary | ICD-10-CM

## 2024-02-08 DIAGNOSIS — E782 Mixed hyperlipidemia: Secondary | ICD-10-CM | POA: Diagnosis not present

## 2024-02-08 DIAGNOSIS — I2510 Atherosclerotic heart disease of native coronary artery without angina pectoris: Secondary | ICD-10-CM

## 2024-02-08 DIAGNOSIS — I10 Essential (primary) hypertension: Secondary | ICD-10-CM

## 2024-02-08 DIAGNOSIS — G4710 Hypersomnia, unspecified: Secondary | ICD-10-CM

## 2024-02-08 DIAGNOSIS — I77810 Thoracic aortic ectasia: Secondary | ICD-10-CM | POA: Diagnosis not present

## 2024-02-08 DIAGNOSIS — I25118 Atherosclerotic heart disease of native coronary artery with other forms of angina pectoris: Secondary | ICD-10-CM | POA: Diagnosis not present

## 2024-02-08 NOTE — Progress Notes (Signed)
 " Cardiology Office Note:  .   Date:  02/08/2024  ID:  David Conner, DOB 29-Jan-1960, MRN [id number] PCP: Nanci Senior, MD  [facility] Providers Cardiologist:  Emeline FORBES Calender, DO    History of Present Illness: .     Discussed the use of AI scribe software for clinical note transcription with the patient, who gave verbal consent to proceed.  History of Present Illness David Conner is a 64 year old male with coronary artery disease and history of NSTEMI who presents for follow-up of CAD  Chest pain and ischemic heart disease with history of multivessel CAD, PCI and CTO of m-dLAD - 10/2017 was admitted to the hospital with chest pain and NSTEMI. LHC 11/13/2017 showed multivessel CAD, PCI to mid LAD 99% stenosis with 3.0 X18 DES resulting in 0% stenosis and TIMI-3 flow.  Moderate-severe OM disease, mild-moderate diffuse disease of nondominant RCA.  CTO of distal LAD and D1.  LCx not mentioned.  Aggressive secondary prevention recommended, uninterrupted DAPT with aspirin  and Brilinta  were recommended.  - Patient continued to have angina and was returned to the Cath Lab 12/12/2017 for stenting of OM1 and OM2. Ranexa  was poorly tolerated and stopped.    - Further complaints of chest pain in 2023.  LHC 03/08/2021: Multivessel coronary artery disease, as detailed below, including CTO's of mid/distal LAD and D1, as well as 50-60% ostial OM1 (RFR = 0.97), and moderate-severe multifocal disease involving nondominant RCA. Compared to 2019, 99% distal LAD disease is now a chronic total occlusion.  Normal LVEDP with upper normal filling pressure of 15 mmHg.  No targets for intervention - Persistent chest pain evaluated in December 2025; pain described as atypical. - PET CT on December 26, 2023 showed abnormal fusion with ischemia and severe reduction in uptake in apical to mid anterior, anterolateral, and anterior septal locations, reversible. - Left heart catheterization on January 05, 2024  revealed single vessel obstructive coronary artery disease and known chronic total occlusion over mid to distal left anterior descending artery and first diagonal. Stents in proximal left anterior descending artery, obtuse marginal 1, and obtuse marginal 2 remain patent without angiographic changes compared to February 2023. Mildly elevated left ventricular end-diastolic pressure at 18 mmHg. No targets for revascularization identified at last catheterization. - Continues with left sided chest pain that occurs mostly at night and relieves with Xanax   Hypertension - Blood pressure generally well-controlled in mornings, around 104/68 mmHg. - Afternoon blood pressure rises up to 146/87 mmHg but back down in the evening - Takes losartan  25 mg. - Occasional slower urinary flow attributed to antihypertensive medication and enlarged prostate.  Anxiety and sleep disturbance - Occasional 'tingle' or 'buzzing feeling' at night, associated with anxiety. - Uses Xanax  intermittently for relief. - Wakes up with headaches occasionally.  Sleep apnea risk - No diagnosis of sleep apnea; sleep study performed ten years ago. - Heavy snoring and daytime fatigue. - High blood pressure and 18-inch collar size contribute to high STOP-BANG score for sleep apnea risk.  Recent respiratory illness - Recent episode of chest congestion and flu-like symptoms. - Symptoms improved with over-the-counter medications and resolved during trip to [place] .       CAD and NSTEMI s/p PCI to mid LAD in 10/2017, persistent angina s/p DES to OM1 and DES to OM2 11/2017, now CTO of mid/distal LAD and D1, as well as 50-60% ostial OM1, unable to tolerate Ranexa  Most recent cath 01/05/24: Mid LAD lesion is 70% stenosed. Mid  LAD to Dist LAD lesion is 100% stenosed. Prox LAD lesion is 30% stenosed. Prox RCA to Mid RCA lesion is 40% stenosed. Ost 1st Diag to 1st Diag lesion is 100% stenosed. Acute Mrg lesion is 80% stenosed. Ost 1st  Mrg lesion is 50% stenosed. Non-stenotic Prox LAD to Mid LAD lesion was previously treated. Non-stenotic 1st Mrg lesion was previously treated. Non-stenotic 2nd Mrg lesion was previously treated. LV end diastolic pressure is mildly elevated. Dilated aorta and aortic root  CTA chest aorta 12/29/2023: Aortic root 4.4 cm Ascending aorta 4.3 cm HTN HLD    ROS: Remaining review of systems negative  Studies Reviewed: .        Results Radiology Cardiac PET CT (12/26/2023): Abnormal perfusion with reversible ischemia and severe reduction in uptake in apical to mid anterior, anterolateral, and anterior septal segments  Diagnostic Left heart catheterization (01/05/2024): Single vessel obstructive coronary artery disease; chronic total occlusion of mid to distal LAD and first diagonal; patent stents in proximal LAD, OM1, and OM2 without angiographic change compared to February 2023; mildly elevated LVEDP at 18 mmHg; no targets for revascularization Echocardiogram: Ascending aorta diameter 43 mm; aortic root diameter 40 mm Risk Assessment/Calculations:         STOP-Bang Score:  7      Physical Exam:   VS:  BP 122/72   Pulse 71   Ht 5' 10 (1.778 m)   Wt 250 lb 11.2 oz (113.7 kg)   SpO2 96%   BMI 35.97 kg/m    Wt Readings from Last 3 Encounters:  02/08/24 250 lb 11.2 oz (113.7 kg)  01/05/24 240 lb (108.9 kg)  12/21/23 242 lb (109.8 kg)    GEN: Well nourished, well developed in no acute distress NECK: No JVD; No carotid bruits CARDIAC:  RRR, no murmurs, no rubs, no gallops RESPIRATORY:  Clear to auscultation without rales, wheezing or rhonchi  ABDOMEN: Soft, non-tender, non-distended EXTREMITIES:  No edema; No deformity   ASSESSMENT AND PLAN: .    Assessment and Plan Assessment & Plan CAD and NSTEMI s/p PCI to mid LAD in 10/2017, persistent angina s/p DES to OM1 and DES to OM2 11/2017, now CTO of mid/distal LAD and D1, as well as 50-60% ostial OM1 with stable angina Recent PET  CT showed reversible ischemia. Left heart catheterization showed single vessel obstructive CAD with known CTO and patent stents. Current chest pain likely anxiety-related and/or stable angina. 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 No acute changes or interventions required. - Goal BP consistently less than 130/80 - Repeat CTA aorta in 1 year (December 2026)  Hypertension Blood pressure generally well-controlled with losartan , slightly elevated in afternoons however soft in the morning and evening and therefore will not increase antihypertensives to prevent bottoming out. Anxiety may contribute to elevated readings.  - Continue current antihypertensive regimen with losartan . - Monitor blood pressure regularly.  Hyperlipidemia, well-controlled Current management with Repatha . - Continue Repatha  for hyperlipidemia management. - LDL goal less than 55  Suspected obstructive sleep apnea High STOP-BANG score indicating high risk. Symptoms include snoring, daytime fatigue, and morning headaches. May  contribute to elevated blood pressure and morning headaches. - Ordered sleep study to evaluate for obstructive sleep apnea.            Follow up: 6 months  Signed, Emeline FORBES Calender, DO  02/08/2024 9:35 AM    [facility] "

## 2024-02-08 NOTE — Patient Instructions (Signed)
 Medication Instructions:  Your physician recommends that you continue on your current medications as directed. Please refer to the Current Medication list given to you today.  *If you need a refill on your cardiac medications before your next appointment, please call your pharmacy*  Testing/Procedures: Your physician has recommended that you have a sleep study. This test records several body functions during sleep, including: brain activity, eye movement, oxygen and carbon dioxide blood levels, heart rate and rhythm, breathing rate and rhythm, the flow of air through your mouth and nose, snoring, body muscle movements, and chest and belly movement.   Follow-Up: At [facility], you and your health needs are our priority.  As part of our continuing mission to provide you with exceptional heart care, our providers are all part of one team.  This team includes your primary Cardiologist (physician) and Advanced Practice Providers or APPs (Physician Assistants and Nurse Practitioners) who all work together to provide you with the care you need, when you need it.  Your next appointment:   6 month(s)  Provider:   Emeline FORBES Calender, DO

## 2024-02-23 ENCOUNTER — Other Ambulatory Visit: Payer: Self-pay | Admitting: Internal Medicine

## 2024-02-23 MED ORDER — METOPROLOL SUCCINATE ER 100 MG PO TB24: 100.0000 mg | ORAL_TABLET | Freq: Two times a day (BID) | ORAL | 3 refills | Status: AC

## 2024-07-12 ENCOUNTER — Ambulatory Visit: Admitting: Internal Medicine
